# Patient Record
Sex: Male | Born: 1945 | Race: White | Hispanic: No | Marital: Single | Smoking: Former smoker
Health system: Southern US, Community
[De-identification: ages and names within clinical notes are randomized; demographics above are authoritative.]

## PROBLEM LIST (undated history)

## (undated) DIAGNOSIS — R809 Proteinuria, unspecified: Secondary | ICD-10-CM

## (undated) DIAGNOSIS — K5909 Other constipation: Secondary | ICD-10-CM

## (undated) DIAGNOSIS — J45909 Unspecified asthma, uncomplicated: Secondary | ICD-10-CM

## (undated) DIAGNOSIS — I509 Heart failure, unspecified: Secondary | ICD-10-CM

## (undated) DIAGNOSIS — F0390 Unspecified dementia without behavioral disturbance: Secondary | ICD-10-CM

## (undated) DIAGNOSIS — J449 Chronic obstructive pulmonary disease, unspecified: Secondary | ICD-10-CM

## (undated) DIAGNOSIS — E785 Hyperlipidemia, unspecified: Secondary | ICD-10-CM

## (undated) DIAGNOSIS — I639 Cerebral infarction, unspecified: Secondary | ICD-10-CM

## (undated) DIAGNOSIS — G709 Myoneural disorder, unspecified: Secondary | ICD-10-CM

## (undated) DIAGNOSIS — F319 Bipolar disorder, unspecified: Secondary | ICD-10-CM

## (undated) DIAGNOSIS — I10 Essential (primary) hypertension: Secondary | ICD-10-CM

## (undated) DIAGNOSIS — Z8719 Personal history of other diseases of the digestive system: Secondary | ICD-10-CM

## (undated) HISTORY — PX: CHOLECYSTECTOMY: SHX55

## (undated) HISTORY — PX: OTHER SURGICAL HISTORY: SHX169

## (undated) HISTORY — PX: NO PAST SURGERIES: SHX2092

---

## 2008-10-22 ENCOUNTER — Emergency Department (HOSPITAL_COMMUNITY): Admission: EM | Admit: 2008-10-22 | Discharge: 2008-10-22 | Payer: Self-pay | Admitting: Family Medicine

## 2009-05-15 ENCOUNTER — Encounter: Admission: RE | Admit: 2009-05-15 | Discharge: 2009-05-15 | Payer: Self-pay | Admitting: Orthopaedic Surgery

## 2010-11-16 ENCOUNTER — Other Ambulatory Visit: Payer: Self-pay | Admitting: Family Medicine

## 2010-11-29 ENCOUNTER — Ambulatory Visit
Admission: RE | Admit: 2010-11-29 | Discharge: 2010-11-29 | Disposition: A | Discharge status: Home / Self Care | Payer: Medicare Other | Source: Ambulatory Visit | Attending: Family Medicine | Admitting: Family Medicine

## 2014-12-18 ENCOUNTER — Other Ambulatory Visit: Payer: Self-pay

## 2014-12-18 ENCOUNTER — Emergency Department: Payer: Medicare Other

## 2014-12-18 ENCOUNTER — Encounter: Payer: Self-pay | Admitting: Emergency Medicine

## 2014-12-18 ENCOUNTER — Emergency Department
Admission: EM | Admit: 2014-12-18 | Discharge: 2014-12-18 | Disposition: A | Discharge status: Home / Self Care | Payer: Medicare Other | Attending: Emergency Medicine | Admitting: Emergency Medicine

## 2014-12-18 DIAGNOSIS — F319 Bipolar disorder, unspecified: Secondary | ICD-10-CM | POA: Insufficient documentation

## 2014-12-18 DIAGNOSIS — F0390 Unspecified dementia without behavioral disturbance: Secondary | ICD-10-CM | POA: Diagnosis not present

## 2014-12-18 DIAGNOSIS — Z720 Tobacco use: Secondary | ICD-10-CM | POA: Insufficient documentation

## 2014-12-18 DIAGNOSIS — R05 Cough: Secondary | ICD-10-CM | POA: Insufficient documentation

## 2014-12-18 DIAGNOSIS — R059 Cough, unspecified: Secondary | ICD-10-CM

## 2014-12-18 DIAGNOSIS — M5431 Sciatica, right side: Secondary | ICD-10-CM | POA: Insufficient documentation

## 2014-12-18 DIAGNOSIS — J42 Unspecified chronic bronchitis: Secondary | ICD-10-CM | POA: Diagnosis not present

## 2014-12-18 HISTORY — DX: Bipolar disorder, unspecified: F31.9

## 2014-12-18 HISTORY — DX: Unspecified dementia, unspecified severity, without behavioral disturbance, psychotic disturbance, mood disturbance, and anxiety: F03.90

## 2014-12-18 LAB — BASIC METABOLIC PANEL
Anion gap: 7 (ref 5–15)
BUN: 19 mg/dL (ref 6–20)
CHLORIDE: 101 mmol/L (ref 101–111)
CO2: 29 mmol/L (ref 22–32)
Calcium: 9.5 mg/dL (ref 8.9–10.3)
Creatinine, Ser: 1.1 mg/dL (ref 0.61–1.24)
GFR calc Af Amer: >60 mL/min (ref >60)
GLUCOSE: 107 mg/dL — AB (ref 65–99)
POTASSIUM: 4.6 mmol/L (ref 3.5–5.1)
Sodium: 137 mmol/L (ref 135–145)

## 2014-12-18 LAB — CBC
HCT: 46.4 % (ref 40.0–52.0)
Hemoglobin: 15.7 g/dL (ref 13.0–18.0)
MCH: 34 pg (ref 26.0–34.0)
MCHC: 33.8 g/dL (ref 32.0–36.0)
MCV: 100.6 fL — AB (ref 80.0–100.0)
PLATELETS: 242 10*3/uL (ref 150–440)
RBC: 4.61 MIL/uL (ref 4.40–5.90)
RDW: 13.7 % (ref 11.5–14.5)
WBC: 5.1 10*3/uL (ref 3.8–10.6)

## 2014-12-18 MED ORDER — HYDROCODONE-ACETAMINOPHEN 5-325 MG PO TABS: 1.0000 | ORAL_TABLET | Freq: Four times a day (QID) | ORAL | Status: DC | PRN

## 2014-12-18 MED ORDER — LEVOFLOXACIN 750 MG PO TABS: 750.0000 mg | ORAL_TABLET | Freq: Every day | ORAL | Status: DC

## 2014-12-18 MED ORDER — PREDNISONE 10 MG PO TABS: 10.0000 mg | ORAL_TABLET | Freq: Every day | ORAL | Status: DC

## 2014-12-18 NOTE — ED Notes (Signed)
Patient reports being sent here from MD office due to low oxygen, patient reports he has had a mild cough and since that time. Patient reports fall at group home and since then has had back and leg pain.

## 2014-12-18 NOTE — ED Provider Notes (Signed)
[facility] Emergency Department Provider Note     Time seen: ----------------------------------------- 3:07 PM on 12/18/2014 -----------------------------------------    I have reviewed the triage vital signs and the nursing notes.  Level 5 caveat: Review of systems and history is difficult to obtain due to dementia and bipolar disorder HISTORY  Chief Complaint Cough    HPI Cameron Ford is a 69 y.o. male who presents ER for cough and last month. According to report he is a chain smoker, also complains of radicular pain down the right leg. Cough is moderate, nothing makes it better or worse. He denies any fevers chills or other complaints.   Past Medical History  Diagnosis Date  . Dementia   . Bipolar 1 disorder     There are no active problems to display for this patient.   History reviewed. No pertinent past surgical history.  Allergies Review of patient's allergies indicates no known allergies.  Social History Social History  Substance Use Topics  . Smoking status: Current Every Day Smoker  . Smokeless tobacco: None  . Alcohol Use: None    Review of Systems Constitutional: Negative for fever. Eyes: Negative for visual changes. ENT: Negative for sore throat. Cardiovascular: Negative for chest pain. Respiratory: Negative for shortness of breath. Positive for cough Gastrointestinal: Negative for abdominal pain, vomiting and diarrhea. Genitourinary: Negative for dysuria. Musculoskeletal: Negative for back pain. Positive for right leg pain Skin: Negative for rash. Neurological: Negative for headaches, focal weakness or numbness.  10-point ROS otherwise negative.  ____________________________________________   PHYSICAL EXAM:  VITAL SIGNS: ED Triage Vitals  Enc Vitals Group     BP 12/18/14 1140 174/114 mmHg     Pulse Rate 12/18/14 1140 78     Resp 12/18/14 1140 20     Temp 12/18/14 1140 98.4 F (36.9 C)     Temp Source  12/18/14 1140 Oral     SpO2 12/18/14 1140 93 %     Weight 12/18/14 1140 192 lb (87.091 kg)     Height 12/18/14 1140  (1.651 m)     Head Cir --      Peak Flow --      Pain Score 12/18/14 1142 4     Pain Loc --      Pain Edu? --      Excl. in GC? --     Constitutional: Alert, Well appearing and in no distress. Eyes: Conjunctivae are normal. PERRL. Normal extraocular movements. ENT   Head: Normocephalic and atraumatic.   Nose: No congestion/rhinnorhea.   Mouth/Throat: Mucous membranes are moist.   Neck: No stridor. Severe kyphosis Cardiovascular: Normal rate, regular rhythm. Normal and symmetric distal pulses are present in all extremities. No murmurs, rubs, or gallops. Respiratory: Normal respiratory effort without tachypnea nor retractions. Breath sounds are clear and equal bilaterally. No wheezes/rales/rhonchi. Gastrointestinal: Soft and nontender. No distention. No abdominal bruits.  Musculoskeletal: Nontender with normal range of motion in all extremities. No joint effusions.  No lower extremity tenderness nor edema. Questionable positive straight leg raise exam on the right Neurologic:  No gross focal neurologic deficits are appreciated.  Skin:  Skin is warm, dry and intact. No rash noted. ____________________________________________  EKG: Interpreted by me. EKG with normal sinus rhythm with a rate of 64 bpm, normal PR interval, normal QS with, normal QT interval.  ____________________________________________  ED COURSE:  Pertinent labs & imaging results that were available during my care of the patient were reviewed by me and considered  in my medical decision making (see chart for details). Patient will need chest x-ray as well as basic labs. ____________________________________________    LABS (pertinent positives/negatives)  Labs Reviewed  CBC - Abnormal; Notable for the following:    MCV 100.6 (*)    All other components within normal limits  BASIC  METABOLIC PANEL - Abnormal; Notable for the following:    Glucose, Bld 107 (*)    All other components within normal limits    RADIOLOGY Images were viewed by me  Chest x-ray IMPRESSION: 1. No acute cardiopulmonary findings. Evidence of remote left-sided thoracic trauma. 2. No acute hip fracture or pelvic fracture. ____________________________________________  FINAL ASSESSMENT AND PLAN  Chronic bronchitis, right-sided sciatica  Plan: Patient with labs and imaging as dictated above. Patient will be discharged with steroid taper, antibiotics, and pain medication. He is stable for outpatient follow-up with his doctor.   Emily Filbert, MD   Emily Filbert, MD 12/18/14 [phone]

## 2014-12-18 NOTE — Discharge Instructions (Signed)
Radicular Pain Radicular pain in either the arm or leg is usually from a bulging or herniated disk in the spine. A piece of the herniated disk may press against the nerves as the nerves exit the spine. This causes pain which is felt at the tips of the nerves down the arm or leg. Other causes of radicular pain may include:  Fractures.  Heart disease.  Cancer.  An abnormal and usually degenerative state of the nervous system or nerves (neuropathy). Diagnosis may require CT or MRI scanning to determine the primary cause.  Nerves that start at the neck (nerve roots) may cause radicular pain in the outer shoulder and arm. It can spread down to the thumb and fingers. The symptoms vary depending on which nerve root has been affected. In most cases radicular pain improves with conservative treatment. Neck problems may require physical therapy, a neck collar, or cervical traction. Treatment may take many weeks, and surgery may be considered if the symptoms do not improve.  Conservative treatment is also recommended for sciatica. Sciatica causes pain to radiate from the lower back or buttock area down the leg into the foot. Often there is a history of back problems. Most patients with sciatica are better after 2 to 4 weeks of rest and other supportive care. Short term bed rest can reduce the disk pressure considerably. Sitting, however, is not a good position since this increases the pressure on the disk. You should avoid bending, lifting, and all other activities which make the problem worse. Traction can be used in severe cases. Surgery is usually reserved for patients who do not improve within the first months of treatment. Only take over-the-counter or prescription medicines for pain, discomfort, or fever as directed by your caregiver. Narcotics and muscle relaxants may help by relieving more severe pain and spasm and by providing mild sedation. Cold or massage can give significant relief. Spinal manipulation  is not recommended. It can increase the degree of disc protrusion. Epidural steroid injections are often effective treatment for radicular pain. These injections deliver medicine to the spinal nerve in the space between the protective covering of the spinal cord and back bones (vertebrae). Your caregiver can give you more information about steroid injections. These injections are most effective when given within two weeks of the onset of pain.  You should see your caregiver for follow up care as recommended. A program for neck and back injury rehabilitation with stretching and strengthening exercises is an important part of management.  SEEK IMMEDIATE MEDICAL CARE IF:  You develop increased pain, weakness, or numbness in your arm or leg.  You develop difficulty with bladder or bowel control.  You develop abdominal pain. Document Released: 05/26/2004 Document Revised: 07/11/2011 Document Reviewed: 08/11/2008 [facility] Patient Information 2015 [place], [place]. This information is not intended to replace advice given to you by your health care provider. Make sure you discuss any questions you have with your health care provider. Chronic Asthmatic Bronchitis Chronic asthmatic bronchitis is a complication of persistent asthma. After a period of time with asthma, some people develop airflow obstruction that is present all the time, even when not having an asthma attack.There is also persistent inflammation of the airways, and the bronchial tubes produce more mucus. Chronic asthmatic bronchitis usually is a permanent problem with the lungs. CAUSES  Chronic asthmatic bronchitis happens most often in people who have asthma and also smoke cigarettes. Occasionally, it can happen to a person with long-standing or severe asthma even if the person  is not a smoker. SIGNS AND SYMPTOMS  Chronic asthmatic bronchitis usually causes symptoms of both asthma and chronic bronchitis, including:   Coughing.  Increased  sputum production.  Wheezing and shortness of breath.  Chest discomfort.  Recurring infections. DIAGNOSIS  Your health care provider will take a medical history and perform a physical exam. Chronic asthmatic bronchitis is suspected when a person with asthma has abnormal results on breathing tests (pulmonary function tests) even when breathing symptoms are at their best. Other tests, such as a chest X-ray, may be performed to rule out other conditions.  TREATMENT  Treatment involves controlling symptoms with medicine and lifestyle changes.  Your health care provider may prescribe asthma medicines, including inhaler and nebulizer medicines.  Infection can be treated with medicine to kill germs (antibiotics). Serious infections may require hospitalization. These can include:  Pneumonia.  Sinus infections.  Acute bronchitis.   Preventing infection and hospitalization is very important. Get an influenza vaccination every year as directed by your health care provider. Ask your health care provider whether you need a pneumonia vaccine.  Ask your health care provider whether you would benefit from a pulmonary rehabilitation program. HOME CARE INSTRUCTIONS  Take medicines only as directed by your health care provider.  If you are a cigarette smoker, the most important thing that you can do is quit. Talk to your health care provider for help with quitting smoking.  Avoid pollen, dust, animal dander, molds, smoke, and other things that cause attacks.  Regular exercise is very important to help you feel better. Discuss possible exercise routines with your health care provider.  If animal dander is the cause of asthma, you may not be able to keep pets.  It is important that you:  Become educated about your medical condition.  Participate in maintaining wellness.  Seek medical care as directed. Delay in seeking medical care could cause permanent injury and may be a risk to your  life. SEEK MEDICAL CARE IF:  You have wheezing and shortness of breath even if taking medicine to prevent attacks.  You have muscle aches, chest pain, or thickening of sputum.  Your sputum changes from clear or white to yellow, green, gray, or bloody. SEEK IMMEDIATE MEDICAL CARE IF:  Your usual medicines do not stop your wheezing.  You have increased coughing or shortness of breath or both.  You have increased difficulty breathing.  You have any problems from the medicine you are taking, such as a rash, itching, swelling, or trouble breathing. MAKE SURE YOU:   Understand these instructions.  Will watch your condition.  Will get help right away if you are not doing well or get worse. Document Released: 02/03/2006 Document Revised: 09/02/2013 Document Reviewed: 05/27/2013 [facility] Patient Information 2015 [place], [place]. This information is not intended to replace advice given to you by your health care provider. Make sure you discuss any questions you have with your health care provider.

## 2014-12-25 ENCOUNTER — Emergency Department
Admission: EM | Admit: 2014-12-25 | Discharge: 2014-12-25 | Disposition: A | Discharge status: Home / Self Care | Payer: Medicare Other | Attending: Emergency Medicine | Admitting: Emergency Medicine

## 2014-12-25 ENCOUNTER — Encounter: Payer: Self-pay | Admitting: Emergency Medicine

## 2014-12-25 DIAGNOSIS — Z720 Tobacco use: Secondary | ICD-10-CM | POA: Insufficient documentation

## 2014-12-25 DIAGNOSIS — K5900 Constipation, unspecified: Secondary | ICD-10-CM | POA: Insufficient documentation

## 2014-12-25 DIAGNOSIS — Z79899 Other long term (current) drug therapy: Secondary | ICD-10-CM | POA: Diagnosis not present

## 2014-12-25 MED ORDER — POLYETHYLENE GLYCOL 3350 17 G PO PACK: 17.0000 g | PACK | Freq: Every day | ORAL | Status: DC | PRN

## 2014-12-25 MED ORDER — MAGNESIUM CITRATE PO SOLN: 1.0000 | Freq: Once | ORAL | Status: DC

## 2014-12-25 NOTE — ED Notes (Addendum)
Patient comes into the E via EMS c/o constipation.  Patient explains that he has not had a bowel movement in 6-7 days.  Patient went to urgent care this morning where they prescribed him metamucil.  Patient states he has been trying multiples laxatives and fiber supplements.  Denies abdominal pain, N/V, fever or chills.

## 2014-12-25 NOTE — Discharge Instructions (Signed)

## 2014-12-25 NOTE — ED Provider Notes (Signed)
[facility] Emergency Department Provider Note  ____________________________________________  Time seen: Seen upon arrival to the emergency department  I have reviewed the triage vital signs and the nursing notes.   HISTORY  Chief Complaint Constipation    HPI Cameron Ford is a 69 y.o. male with a history of bipolar disease and dementia who presents today with constipation. He says that he has not moved his bowels in 7 days. He was seen in urgent care this morning where an x-ray was done and constipation was diagnosed. He was then prescribed Metamucil as well as senna. However, he says that the Metamucil is making him nauseous.He says that he usually moves his bowels only once every 2-3 days. He says that he usually takes a laxative when he has difficulty moving his bowels. Denies any pain. Denies any vomiting. Is passing gas and said past a small amount of hard stool this morning.   Past Medical History  Diagnosis Date  . Dementia   . Bipolar 1 disorder     There are no active problems to display for this patient.   History reviewed. No pertinent past surgical history.  Current Outpatient Rx  Name  Route  Sig  Dispense  Refill  . HYDROcodone-acetaminophen (NORCO/VICODIN) 5-325 MG per tablet   Oral   Take 1 tablet by mouth every 6 (six) hours as needed for moderate pain.   30 tablet   0   . levofloxacin (LEVAQUIN) 750 MG tablet   Oral   Take 1 tablet (750 mg total) by mouth daily.   5 tablet   0   . predniSONE (DELTASONE) 10 MG tablet   Oral   Take 1 tablet (10 mg total) by mouth daily. Take 60 mg on day one, then decrease by 10 mg daily until gone   21 tablet   0     Allergies Review of patient's allergies indicates no known allergies.  No family history on file.  Social History Social History  Substance Use Topics  . Smoking status: Current Every Day Smoker -- 1.00 packs/day  . Smokeless tobacco: None  . Alcohol Use: No     Review of Systems Constitutional: No fever/chills Eyes: No visual changes. ENT: No sore throat. Cardiovascular: Denies chest pain. Respiratory: Denies shortness of breath. Gastrointestinal: No abdominal pain.  No nausea, no vomiting.  No diarrhea.   Genitourinary: Negative for dysuria. Musculoskeletal: Negative for back pain. Skin: Negative for rash. Neurological: Negative for headaches, focal weakness or numbness.  10-point ROS otherwise negative.  ____________________________________________   PHYSICAL EXAM:  VITAL SIGNS: ED Triage Vitals  Enc Vitals Group     BP 12/25/14 1720 107/59 mmHg     Pulse Rate 12/25/14 1720 75     Resp 12/25/14 1720 20     Temp 12/25/14 1720 98.6 F (37 C)     Temp Source 12/25/14 1720 Oral     SpO2 12/25/14 1720 93 %     Weight 12/25/14 1720 200 lb 6.4 oz (90.901 kg)     Height 12/25/14 1720  (1.778 m)     Head Cir --      Peak Flow --      Pain Score --      Pain Loc --      Pain Edu? --      Excl. in GC? --     Constitutional: Alert and oriented. Well appearing and in no acute distress. Eyes: Conjunctivae are normal. PERRL. EOMI. Head:  Atraumatic. Nose: No congestion/rhinnorhea. Mouth/Throat: Mucous membranes are moist.  Oropharynx non-erythematous. Neck: No stridor.   Cardiovascular: Normal rate, regular rhythm. Grossly normal heart sounds.  Good peripheral circulation. Respiratory: Normal respiratory effort.  No retractions. Lungs CTAB. Gastrointestinal: Soft and nontender. No distention. No abdominal bruits. No CVA tenderness. Rectal exam without any fecal impaction. Musculoskeletal: No lower extremity tenderness nor edema.  No joint effusions. Neurologic:  Normal speech and language. No gross focal neurologic deficits are appreciated. No gait instability. Skin:  Skin is warm, dry and intact. No rash noted. Psychiatric: Mood and affect are normal. Speech and behavior are  normal.  ____________________________________________   LABS (all labs ordered are listed, but only abnormal results are displayed)  Labs Reviewed - No data to display ____________________________________________  EKG   ____________________________________________  RADIOLOGY   ____________________________________________   PROCEDURES    ____________________________________________   INITIAL IMPRESSION / ASSESSMENT AND PLAN / ED COURSE  Pertinent labs & imaging results that were available during my care of the patient were reviewed by me and considered in my medical decision making (see chart for details).  ----------------------------------------- 5:34 PM on 12/25/2014 -----------------------------------------  Patient resting comfortably at this time. We'll prescribe MiraLAX that the patient may take instead of the Metamucil. I will also prescribe him mag citrate if he does not have any bowel movement within 24 hours after taking the MiraLAX. We'll discharge to home. ____________________________________________   FINAL CLINICAL IMPRESSION(S) / ED DIAGNOSES  Acute on chronic constipation. Initial visit.     Myrna Blazer, MD 12/25/14 [phone]

## 2015-02-06 ENCOUNTER — Encounter: Payer: Self-pay | Admitting: *Deleted

## 2015-02-09 ENCOUNTER — Encounter
Admission: RE | Disposition: A | Discharge status: Home / Self Care | Payer: Self-pay | Source: Ambulatory Visit | Attending: Gastroenterology

## 2015-02-09 ENCOUNTER — Ambulatory Visit: Payer: Medicare Other | Admitting: Anesthesiology

## 2015-02-09 ENCOUNTER — Ambulatory Visit
Admission: RE | Admit: 2015-02-09 | Discharge: 2015-02-09 | Disposition: A | Discharge status: Home / Self Care | Payer: Medicare Other | Source: Ambulatory Visit | Attending: Gastroenterology | Admitting: Gastroenterology

## 2015-02-09 ENCOUNTER — Encounter: Payer: Self-pay | Admitting: *Deleted

## 2015-02-09 DIAGNOSIS — R809 Proteinuria, unspecified: Secondary | ICD-10-CM | POA: Insufficient documentation

## 2015-02-09 DIAGNOSIS — Z9049 Acquired absence of other specified parts of digestive tract: Secondary | ICD-10-CM | POA: Insufficient documentation

## 2015-02-09 DIAGNOSIS — R251 Tremor, unspecified: Secondary | ICD-10-CM | POA: Diagnosis not present

## 2015-02-09 DIAGNOSIS — Z79899 Other long term (current) drug therapy: Secondary | ICD-10-CM | POA: Insufficient documentation

## 2015-02-09 DIAGNOSIS — E785 Hyperlipidemia, unspecified: Secondary | ICD-10-CM | POA: Diagnosis not present

## 2015-02-09 DIAGNOSIS — F17200 Nicotine dependence, unspecified, uncomplicated: Secondary | ICD-10-CM | POA: Diagnosis not present

## 2015-02-09 DIAGNOSIS — F0390 Unspecified dementia without behavioral disturbance: Secondary | ICD-10-CM | POA: Insufficient documentation

## 2015-02-09 DIAGNOSIS — K644 Residual hemorrhoidal skin tags: Secondary | ICD-10-CM | POA: Insufficient documentation

## 2015-02-09 DIAGNOSIS — F319 Bipolar disorder, unspecified: Secondary | ICD-10-CM | POA: Insufficient documentation

## 2015-02-09 DIAGNOSIS — K625 Hemorrhage of anus and rectum: Secondary | ICD-10-CM | POA: Diagnosis present

## 2015-02-09 DIAGNOSIS — K640 First degree hemorrhoids: Secondary | ICD-10-CM | POA: Insufficient documentation

## 2015-02-09 DIAGNOSIS — K5909 Other constipation: Secondary | ICD-10-CM | POA: Insufficient documentation

## 2015-02-09 DIAGNOSIS — Z7982 Long term (current) use of aspirin: Secondary | ICD-10-CM | POA: Diagnosis not present

## 2015-02-09 DIAGNOSIS — I10 Essential (primary) hypertension: Secondary | ICD-10-CM | POA: Insufficient documentation

## 2015-02-09 HISTORY — PX: COLONOSCOPY WITH PROPOFOL: SHX5780

## 2015-02-09 HISTORY — DX: Myoneural disorder, unspecified: G70.9

## 2015-02-09 HISTORY — DX: Essential (primary) hypertension: I10

## 2015-02-09 HISTORY — PX: ESOPHAGOGASTRODUODENOSCOPY (EGD) WITH PROPOFOL: SHX5813

## 2015-02-09 HISTORY — DX: Hyperlipidemia, unspecified: E78.5

## 2015-02-09 HISTORY — DX: Personal history of other diseases of the digestive system: Z87.19

## 2015-02-09 HISTORY — DX: Other constipation: K59.09

## 2015-02-09 HISTORY — DX: Proteinuria, unspecified: R80.9

## 2015-02-09 SURGERY — COLONOSCOPY WITH PROPOFOL
Anesthesia: General

## 2015-02-09 MED ORDER — FENTANYL CITRATE (PF) 100 MCG/2ML IJ SOLN
INTRAMUSCULAR | Status: DC | PRN
  Administered 2015-02-09: 50 ug via INTRAVENOUS

## 2015-02-09 MED ORDER — SODIUM CHLORIDE 0.9 % IV SOLN
INTRAVENOUS | Status: DC
  Administered 2015-02-09: 1000 mL via INTRAVENOUS

## 2015-02-09 MED ORDER — PHENYLEPHRINE HCL 10 MG/ML IJ SOLN
INTRAMUSCULAR | Status: DC | PRN
  Administered 2015-02-09: 50 ug via INTRAVENOUS

## 2015-02-09 MED ORDER — LIDOCAINE HCL (CARDIAC) 20 MG/ML IV SOLN
INTRAVENOUS | Status: DC | PRN
  Administered 2015-02-09: 50 mg via INTRAVENOUS

## 2015-02-09 MED ORDER — PROPOFOL 10 MG/ML IV BOLUS
INTRAVENOUS | Status: DC | PRN
  Administered 2015-02-09 (×2): 20 mg via INTRAVENOUS

## 2015-02-09 MED ORDER — PROPOFOL 500 MG/50ML IV EMUL
INTRAVENOUS | Status: DC | PRN
  Administered 2015-02-09: 100 ug/kg/min via INTRAVENOUS

## 2015-02-09 NOTE — Discharge Instructions (Signed)

## 2015-02-09 NOTE — Anesthesia Postprocedure Evaluation (Signed)
  Anesthesia Post-op Note  Patient: Cameron Ford  Procedure(s) Performed: Procedure(s): COLONOSCOPY WITH PROPOFOL (N/A) ESOPHAGOGASTRODUODENOSCOPY (EGD) WITH PROPOFOL (N/A)  Anesthesia type:General  Patient location: PACU  Post pain: Pain level controlled  Post assessment: Post-op Vital signs reviewed, Patient's Cardiovascular Status Stable, Respiratory Function Stable, Patent Airway and No signs of Nausea or vomiting  Post vital signs: Reviewed and stable  Last Vitals:  Filed Vitals:   02/09/15 1010  BP: 119/66  Pulse: 59  Temp:   Resp: 17    Level of consciousness: awake, alert  and patient cooperative  Complications: No apparent anesthesia complications

## 2015-02-09 NOTE — Op Note (Signed)
[facility] Gastroenterology Patient Name: Cameron Ford Procedure Date: 02/09/2015 8:54 AM MRN: [id number] Account #: [id number] Date of Birth: November 30, 1945 Admit Type: Outpatient Age: 69 Room: [facility] ENDO ROOM 3 Gender: Male Note Status: Finalized Procedure:         Colonoscopy Indications:       Heme positive stool, Rectal bleeding Patient Profile:   This is a 69 year old male. Providers:         Rhona Raider. Shelle Iron, MD Referring MD:      Windle Guard, MD (Referring MD) Medicines:         Propofol per Anesthesia Complications:     No immediate complications. Procedure:         Pre-Anesthesia Assessment:                    - Prior to the procedure, a History and Physical was                     performed, and patient medications, allergies and                     sensitivities were reviewed. The patient's tolerance of                     previous anesthesia was reviewed.                    After obtaining informed consent, the colonoscope was                     passed under direct vision. Throughout the procedure, the                     patient's blood pressure, pulse, and oxygen saturations                     were monitored continuously. The Colonoscope was                     introduced through the anus and advanced to the the cecum,                     identified by appendiceal orifice and ileocecal valve. The                     colonoscopy was unusually difficult due to significant                     looping and a tortuous colon. Successful completion of the                     procedure was aided by using manual pressure. The patient                     tolerated the procedure well. The quality of the bowel                     preparation was good. Findings:      The perianal exam findings include non-thrombosed external hemorrhoids.      Internal hemorrhoids were found during retroflexion. The hemorrhoids       were Grade I (internal hemorrhoids that do  not prolapse).      The exam was otherwise without abnormality. Impression:        - Non-thrombosed external  hemorrhoids found on perianal                     exam.                    - Internal hemorrhoids.                    - The examination was otherwise normal.                    - No specimens collected. Recommendation:    - Observe patient in GI recovery unit.                    - High fiber diet.                    - Continue present medications.                    - Return to referring physician.                    - No further screening colonoscopies given age at next                     date would be 51 and multiple comorbidities.                    - The findings and recommendations were discussed with the                     patient. Procedure Code(s): --- Professional ---                    [phone], Colonoscopy, flexible; diagnostic, including                     collection of specimen(s) by brushing or washing, when                     performed (separate procedure) CPT copyright 2014 [employer]. All rights reserved. The codes documented in this report are preliminary and upon coder review may  be revised to meet current compliance requirements. Cameron Frames, MD 02/09/2015 9:41:00 AM This report has been signed electronically. Number of Addenda: 0 Note Initiated On: 02/09/2015 8:54 AM Scope Withdrawal Time: 0 hours 17 minutes 36 seconds  Total Procedure Duration: 0 hours 36 minutes 10 seconds       [facility]

## 2015-02-09 NOTE — Transfer of Care (Signed)
Immediate Anesthesia Transfer of Care Note  Patient: Cameron Ford  Procedure(s) Performed: Procedure(s): COLONOSCOPY WITH PROPOFOL (N/A) ESOPHAGOGASTRODUODENOSCOPY (EGD) WITH PROPOFOL (N/A)  Patient Location: PACU  Anesthesia Type:General  Level of Consciousness: awake, alert  and oriented  Airway & Oxygen Therapy: Patient Spontanous Breathing and Patient connected to nasal cannula oxygen  Post-op Assessment: Report given to RN and Post -op Vital signs reviewed and stable  Post vital signs: Reviewed and stable  Last Vitals:  Filed Vitals:   02/09/15 0825  BP: 133/65  Pulse: 68  Temp: 36.8 C  Resp: 18    Complications: No apparent anesthesia complications
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
## 2015-02-09 NOTE — Anesthesia Preprocedure Evaluation (Signed)
Anesthesia Evaluation  Patient identified by MRN, date of birth, ID band Patient awake    Reviewed: Allergy & Precautions, H&P , NPO status , Patient's Chart, lab work & pertinent test results  Airway Mallampati: III  TM Distance: >3 FB Neck ROM: full    Dental  (+) Poor Dentition   Pulmonary Current Smoker,    Pulmonary exam normal breath sounds clear to auscultation       Cardiovascular Exercise Tolerance: Poor hypertension, (-) Past MI Normal cardiovascular exam Rhythm:regular Rate:Normal     Neuro/Psych PSYCHIATRIC DISORDERS Bipolar Disorder  Neuromuscular disease negative psych ROS   GI/Hepatic negative GI ROS, Neg liver ROS, neg GERD  ,  Endo/Other  negative endocrine ROS  Renal/GU negative Renal ROS  negative genitourinary   Musculoskeletal   Abdominal   Peds  Hematology negative hematology ROS (+)   Anesthesia Other Findings Past Medical History:   Dementia                                                     Bipolar 1 disorder (HCC)                                     Hyperlipidemia                                               Microalbuminuria                                             Neuromuscular disorder (HCC)                                   Comment:tremors   Chronic constipation                                         History of small bowel obstruction                           Hypertension                                                Signs and symptoms suggestive of sleep apnea   BMI    Body Mass Index   33.58 kg/m 2      Reproductive/Obstetrics negative OB ROS                             Anesthesia Physical Anesthesia Plan  ASA: III  Anesthesia Plan: General   Post-op Pain Management:    Induction:   Airway Management Planned:   Additional Equipment:   Intra-op Plan:   Post-operative Plan:   Informed  Consent: I have reviewed the patients  History and Physical, chart, labs and discussed the procedure including the risks, benefits and alternatives for the proposed anesthesia with the patient or authorized representative who has indicated his/her understanding and acceptance.   Dental Advisory Given  Plan Discussed with: Anesthesiologist, CRNA and Surgeon  Anesthesia Plan Comments:         Anesthesia Quick Evaluation

## 2015-02-18 ENCOUNTER — Encounter: Payer: Self-pay | Admitting: Gastroenterology

## 2015-03-12 ENCOUNTER — Observation Stay
Admission: EM | Admit: 2015-03-12 | Discharge: 2015-03-14 | Payer: Medicare Other | Attending: Internal Medicine | Admitting: Internal Medicine

## 2015-03-12 ENCOUNTER — Encounter: Payer: Self-pay | Admitting: Emergency Medicine

## 2015-03-12 DIAGNOSIS — R251 Tremor, unspecified: Secondary | ICD-10-CM | POA: Insufficient documentation

## 2015-03-12 DIAGNOSIS — R109 Unspecified abdominal pain: Secondary | ICD-10-CM | POA: Insufficient documentation

## 2015-03-12 DIAGNOSIS — K5900 Constipation, unspecified: Secondary | ICD-10-CM | POA: Insufficient documentation

## 2015-03-12 DIAGNOSIS — I10 Essential (primary) hypertension: Secondary | ICD-10-CM | POA: Diagnosis not present

## 2015-03-12 DIAGNOSIS — R809 Proteinuria, unspecified: Secondary | ICD-10-CM | POA: Diagnosis not present

## 2015-03-12 DIAGNOSIS — F319 Bipolar disorder, unspecified: Secondary | ICD-10-CM | POA: Insufficient documentation

## 2015-03-12 DIAGNOSIS — R05 Cough: Secondary | ICD-10-CM | POA: Insufficient documentation

## 2015-03-12 DIAGNOSIS — Z7982 Long term (current) use of aspirin: Secondary | ICD-10-CM | POA: Diagnosis not present

## 2015-03-12 DIAGNOSIS — K644 Residual hemorrhoidal skin tags: Secondary | ICD-10-CM | POA: Insufficient documentation

## 2015-03-12 DIAGNOSIS — K922 Gastrointestinal hemorrhage, unspecified: Secondary | ICD-10-CM | POA: Diagnosis not present

## 2015-03-12 DIAGNOSIS — Z79899 Other long term (current) drug therapy: Secondary | ICD-10-CM | POA: Diagnosis not present

## 2015-03-12 DIAGNOSIS — K649 Unspecified hemorrhoids: Secondary | ICD-10-CM | POA: Diagnosis not present

## 2015-03-12 DIAGNOSIS — R197 Diarrhea, unspecified: Secondary | ICD-10-CM | POA: Diagnosis not present

## 2015-03-12 DIAGNOSIS — E785 Hyperlipidemia, unspecified: Secondary | ICD-10-CM | POA: Insufficient documentation

## 2015-03-12 DIAGNOSIS — F17200 Nicotine dependence, unspecified, uncomplicated: Secondary | ICD-10-CM | POA: Diagnosis not present

## 2015-03-12 DIAGNOSIS — F0390 Unspecified dementia without behavioral disturbance: Secondary | ICD-10-CM | POA: Diagnosis not present

## 2015-03-12 DIAGNOSIS — Z8673 Personal history of transient ischemic attack (TIA), and cerebral infarction without residual deficits: Secondary | ICD-10-CM | POA: Insufficient documentation

## 2015-03-12 HISTORY — DX: Cerebral infarction, unspecified: I63.9

## 2015-03-12 LAB — URINALYSIS COMPLETE WITH MICROSCOPIC (ARMC ONLY)
BILIRUBIN URINE: NEGATIVE
Glucose, UA: NEGATIVE mg/dL
HGB URINE DIPSTICK: NEGATIVE
Ketones, ur: NEGATIVE mg/dL
LEUKOCYTES UA: NEGATIVE
Nitrite: NEGATIVE
PH: 5 (ref 5.0–8.0)
PROTEIN: NEGATIVE mg/dL
RBC / HPF: NONE SEEN RBC/hpf (ref 0–5)
SQUAMOUS EPITHELIAL / LPF: NONE SEEN
Specific Gravity, Urine: 1.006 (ref 1.005–1.030)
WBC UA: NONE SEEN WBC/hpf (ref 0–5)

## 2015-03-12 LAB — CBC WITH DIFFERENTIAL/PLATELET
BASOS ABS: 0.1 10*3/uL (ref 0–0.1)
BASOS PCT: 1 %
EOS PCT: 2 %
Eosinophils Absolute: 0.2 10*3/uL (ref 0–0.7)
HCT: 47.9 % (ref 40.0–52.0)
Hemoglobin: 15.9 g/dL (ref 13.0–18.0)
LYMPHS PCT: 18 %
Lymphs Abs: 1.5 10*3/uL (ref 1.0–3.6)
MCH: 34.1 pg — ABNORMAL HIGH (ref 26.0–34.0)
MCHC: 33.1 g/dL (ref 32.0–36.0)
MCV: 103 fL — AB (ref 80.0–100.0)
Monocytes Absolute: 0.9 10*3/uL (ref 0.2–1.0)
Monocytes Relative: 10 %
Neutro Abs: 6 10*3/uL (ref 1.4–6.5)
Neutrophils Relative %: 69 %
PLATELETS: 270 10*3/uL (ref 150–440)
RBC: 4.65 MIL/uL (ref 4.40–5.90)
RDW: 14.2 % (ref 11.5–14.5)
WBC: 8.7 10*3/uL (ref 3.8–10.6)

## 2015-03-12 LAB — COMPREHENSIVE METABOLIC PANEL
ALBUMIN: 3.5 g/dL (ref 3.5–5.0)
ALT: 19 U/L (ref 17–63)
AST: 29 U/L (ref 15–41)
Alkaline Phosphatase: 80 U/L (ref 38–126)
Anion gap: 4 — ABNORMAL LOW (ref 5–15)
BUN: 21 mg/dL — AB (ref 6–20)
CHLORIDE: 103 mmol/L (ref 101–111)
CO2: 29 mmol/L (ref 22–32)
CREATININE: 1.07 mg/dL (ref 0.61–1.24)
Calcium: 8.9 mg/dL (ref 8.9–10.3)
GFR calc Af Amer: >60 mL/min (ref >60)
GLUCOSE: 108 mg/dL — AB (ref 65–99)
POTASSIUM: 5 mmol/L (ref 3.5–5.1)
SODIUM: 136 mmol/L (ref 135–145)
Total Bilirubin: 1 mg/dL (ref 0.3–1.2)
Total Protein: 6.6 g/dL (ref 6.5–8.1)

## 2015-03-12 LAB — ABO/RH: ABO/RH(D): O POS

## 2015-03-12 LAB — TYPE AND SCREEN
ABO/RH(D): O POS
ANTIBODY SCREEN: NEGATIVE

## 2015-03-12 LAB — HEMOGLOBIN: HEMOGLOBIN: 14.5 g/dL (ref 13.0–18.0)

## 2015-03-12 LAB — LITHIUM LEVEL: LITHIUM LVL: 0.7 mmol/L (ref 0.60–1.20)

## 2015-03-12 LAB — TROPONIN I: Troponin I: <0.03 ng/mL (ref <0.031)

## 2015-03-12 MED ORDER — LISINOPRIL 10 MG PO TABS
10.0000 mg | ORAL_TABLET | Freq: Every day | ORAL | Status: DC
  Administered 2015-03-13 – 2015-03-14 (×2): 10 mg via ORAL
  Filled 2015-03-12 (×2): qty 1

## 2015-03-12 MED ORDER — SODIUM CHLORIDE 0.9 % IV SOLN
INTRAVENOUS | Status: DC
  Administered 2015-03-12: 22:00:00 via INTRAVENOUS

## 2015-03-12 MED ORDER — RISPERIDONE 0.5 MG PO TABS
1.0000 mg | ORAL_TABLET | Freq: Three times a day (TID) | ORAL | Status: DC
  Administered 2015-03-12 – 2015-03-14 (×5): 1 mg via ORAL
  Filled 2015-03-12 (×5): qty 2

## 2015-03-12 MED ORDER — PROPRANOLOL HCL 40 MG PO TABS
40.0000 mg | ORAL_TABLET | Freq: Two times a day (BID) | ORAL | Status: DC
  Administered 2015-03-12 – 2015-03-14 (×4): 40 mg via ORAL
  Filled 2015-03-12 (×4): qty 1

## 2015-03-12 MED ORDER — BENZTROPINE MESYLATE 0.5 MG PO TABS
0.5000 mg | ORAL_TABLET | Freq: Three times a day (TID) | ORAL | Status: DC
  Administered 2015-03-12 – 2015-03-14 (×5): 0.5 mg via ORAL
  Filled 2015-03-12 (×9): qty 1

## 2015-03-12 MED ORDER — ONDANSETRON HCL 4 MG/2ML IJ SOLN: 4.0000 mg | Freq: Four times a day (QID) | INTRAMUSCULAR | Status: DC | PRN

## 2015-03-12 MED ORDER — LITHIUM CARBONATE 300 MG PO CAPS
300.0000 mg | ORAL_CAPSULE | Freq: Every day | ORAL | Status: DC
  Administered 2015-03-12 – 2015-03-13 (×2): 300 mg via ORAL
  Filled 2015-03-12 (×4): qty 1

## 2015-03-12 MED ORDER — ONDANSETRON HCL 4 MG PO TABS: 4.0000 mg | ORAL_TABLET | Freq: Four times a day (QID) | ORAL | Status: DC | PRN

## 2015-03-12 MED ORDER — ACETAMINOPHEN 325 MG PO TABS
650.0000 mg | ORAL_TABLET | Freq: Four times a day (QID) | ORAL | Status: DC | PRN
  Administered 2015-03-13: 650 mg via ORAL
  Filled 2015-03-12: qty 2

## 2015-03-12 MED ORDER — SODIUM CHLORIDE 0.9 % IV BOLUS (SEPSIS)
1000.0000 mL | Freq: Once | INTRAVENOUS | Status: AC
  Administered 2015-03-12: 1000 mL via INTRAVENOUS

## 2015-03-12 MED ORDER — IPRATROPIUM-ALBUTEROL 0.5-2.5 (3) MG/3ML IN SOLN: 3.0000 mL | RESPIRATORY_TRACT | Status: DC | PRN

## 2015-03-12 MED ORDER — CARBAMAZEPINE 200 MG PO TABS
200.0000 mg | ORAL_TABLET | Freq: Two times a day (BID) | ORAL | Status: DC
  Administered 2015-03-12 – 2015-03-14 (×4): 200 mg via ORAL
  Filled 2015-03-12 (×4): qty 1

## 2015-03-12 MED ORDER — LUBIPROSTONE 8 MCG PO CAPS
8.0000 ug | ORAL_CAPSULE | Freq: Two times a day (BID) | ORAL | Status: DC
  Administered 2015-03-13 – 2015-03-14 (×3): 8 ug via ORAL
  Filled 2015-03-12 (×6): qty 1

## 2015-03-12 MED ORDER — ATORVASTATIN CALCIUM 20 MG PO TABS
20.0000 mg | ORAL_TABLET | Freq: Every day | ORAL | Status: DC
  Administered 2015-03-12 – 2015-03-13 (×2): 20 mg via ORAL
  Filled 2015-03-12 (×2): qty 1

## 2015-03-12 MED ORDER — ACETAMINOPHEN 650 MG RE SUPP: 650.0000 mg | Freq: Four times a day (QID) | RECTAL | Status: DC | PRN
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
## 2015-03-12 NOTE — ED Notes (Signed)
Stool guaiac positive, noted frank blood, Dr. Derrill KayGoodman aware.

## 2015-03-12 NOTE — ED Notes (Signed)
[facility] of [facility] pain followed by incontinent diarrhea.  Pt sat in chair and had possible vagal episode.

## 2015-03-13 DIAGNOSIS — K922 Gastrointestinal hemorrhage, unspecified: Secondary | ICD-10-CM | POA: Diagnosis not present

## 2015-03-13 LAB — CBC
HCT: 41 % (ref 40.0–52.0)
HEMOGLOBIN: 13.5 g/dL (ref 13.0–18.0)
MCH: 33.9 pg (ref 26.0–34.0)
MCHC: 32.9 g/dL (ref 32.0–36.0)
MCV: 103.1 fL — AB (ref 80.0–100.0)
Platelets: 244 10*3/uL (ref 150–440)
RBC: 3.98 MIL/uL — ABNORMAL LOW (ref 4.40–5.90)
RDW: 13.9 % (ref 11.5–14.5)
WBC: 7.8 10*3/uL (ref 3.8–10.6)

## 2015-03-13 LAB — BASIC METABOLIC PANEL
Anion gap: 3 — ABNORMAL LOW (ref 5–15)
BUN: 17 mg/dL (ref 6–20)
CO2: 30 mmol/L (ref 22–32)
Calcium: 8.7 mg/dL — ABNORMAL LOW (ref 8.9–10.3)
Chloride: 104 mmol/L (ref 101–111)
Creatinine, Ser: 0.87 mg/dL (ref 0.61–1.24)
GFR calc Af Amer: >60 mL/min (ref >60)
GFR calc non Af Amer: >60 mL/min (ref >60)
Glucose, Bld: 104 mg/dL — ABNORMAL HIGH (ref 65–99)
Potassium: 4.3 mmol/L (ref 3.5–5.1)
Sodium: 137 mmol/L (ref 135–145)

## 2015-03-13 LAB — HEMOGLOBIN: Hemoglobin: 13.6 g/dL (ref 13.0–18.0)

## 2015-03-13 MED ORDER — FUROSEMIDE 10 MG/ML IJ SOLN
INTRAMUSCULAR | Status: AC
  Administered 2015-03-13: 20 mg via INTRAVENOUS
  Filled 2015-03-13: qty 2

## 2015-03-13 MED ORDER — FUROSEMIDE 10 MG/ML IJ SOLN
20.0000 mg | Freq: Once | INTRAMUSCULAR | Status: AC
  Administered 2015-03-13: 20 mg via INTRAVENOUS

## 2015-03-13 NOTE — Clinical Social Work Note (Signed)
Clinical Social Worker left a message for pt's guardian, [name] [phone]),[facility] requesting a return phone call. CSW will continue to follow.   Dede QuerySarah Topaz Raglin, MSW, LCSW Clinical Social Worker  [phone]

## 2015-03-13 NOTE — Progress Notes (Signed)
[facility] - [place] at [facility]   PATIENT NAME: Cameron Ford    MR#:  [id number]  DATE OF BIRTH:  05-28-1945  SUBJECTIVE:  CHIEF COMPLAINT:  Patient is resting comfortably. Denies any bowel movements today as reported by the nurse patient had one bowel movement yesterday night with bloody streaks.  REVIEW OF SYSTEMS:  CONSTITUTIONAL: No fever, fatigue or weakness.  EYES: No blurred or double vision.  EARS, NOSE, AND THROAT: No tinnitus or ear pain.  RESPIRATORY: No cough, shortness of breath, wheezing or hemoptysis.  CARDIOVASCULAR: No chest pain, orthopnea, edema.  GASTROINTESTINAL: No nausea, vomiting, diarrhea or abdominal pain.  GENITOURINARY: No dysuria, hematuria.  ENDOCRINE: No polyuria, nocturia,  HEMATOLOGY: No anemia, easy bruising or bleeding SKIN: No rash or lesion. MUSCULOSKELETAL: No joint pain or arthritis.   NEUROLOGIC: No tingling, numbness, weakness.  PSYCHIATRY: No anxiety or depression.   DRUG ALLERGIES:  No Known Allergies  VITALS:  Blood pressure 109/53, pulse 62, temperature 98.8 F (37.1 C), temperature source Oral, resp. rate 20, height 5\' 8"  (1.727 m), weight 80.105 kg (176 lb 9.6 oz), SpO2 93 %.  PHYSICAL EXAMINATION:  GENERAL:  69 y.o.-year-old patient lying in the bed with no acute distress.  EYES: Pupils equal, round, reactive to light and accommodation. No scleral icterus. Extraocular muscles intact.  HEENT: Head atraumatic, normocephalic. Oropharynx and nasopharynx clear.  NECK:  Supple, no jugular venous distention. No thyroid enlargement, no tenderness.  LUNGS: Normal breath sounds bilaterally, no wheezing, rales,rhonchi or crepitation. No use of accessory muscles of respiration.  CARDIOVASCULAR: S1, S2 normal. No murmurs, rubs, or gallops.  ABDOMEN: Soft, nontender, nondistended. Bowel sounds present. No organomegaly or mass.  EXTREMITIES: No pedal edema, cyanosis, or clubbing.  NEUROLOGIC: Cranial nerves II  through XII are intact. Muscle strength 5/5 in all extremities. Sensation intact. Gait not checked.  PSYCHIATRIC: The patient is alert and oriented x 3.  SKIN: No obvious rash, lesion, or ulcer.    LABORATORY PANEL:   CBC  Recent Labs Lab 03/13/15 0320 03/13/15 0904  WBC 7.8  --   HGB 13.5 13.6  HCT 41.0  --   PLT 244  --    ------------------------------------------------------------------------------------------------------------------  Chemistries   Recent Labs Lab 03/12/15 1559 03/13/15 0320  NA 136 137  K 5.0 4.3  CL 103 104  CO2 29 30  GLUCOSE 108* 104*  BUN 21* 17  CREATININE 1.07 0.87  CALCIUM 8.9 8.7*  AST 29  --   ALT 19  --   ALKPHOS 80  --   BILITOT 1.0  --    ------------------------------------------------------------------------------------------------------------------  Cardiac Enzymes  Recent Labs Lab 03/12/15 1559  TROPONINI <0.03   ------------------------------------------------------------------------------------------------------------------  RADIOLOGY:  No results found.  EKG:   Orders placed or performed during the hospital encounter of 12/18/14  . ED EKG  . ED EKG  . EKG    ASSESSMENT AND PLAN:   69 year old male with past history of hypertension, bipolar disorder, dementia, hyperlipidemia, history of previous CVA who presented to the hospital due to multiple episodes of rectal bleeding.  #1 GI bleed-this is likely a lower GI bleed given his rectal bleeding. probably hemorrhoidal bleeding   recent colonoscopy  was noted to have internal & external Hemorrhoids. -Hemoglobin is stable and I'll observe him overnight.  -Advance diet as tolerated  -Repeat CBC  in the morning. -If he continues to have further bleeding would consider gastroenterology consult, otherwise Will discharge patient  #2 history of bipolar disorder-continue  Tegretol, lithium. I will check a lithium level.  #3 history of dementia-continue Risperdal,  Cogentin.  #4 hyperlipidemia-continue atorvastatin.  #5 hypertension-continue lisinopril.     All the records are reviewed and case discussed with Care Management/Social Workerr. Management plans discussed with the patient, family and they are in agreement.  CODE STATUS: fc  TOTAL TIME TAKING CARE OF THIS PATIENT: 35  minutes.   POSSIBLE D/C IN 1-2  DAYS, DEPENDING ON CLINICAL CONDITION.   Ramonita Lab M.D on 03/13/2015 at 5:06 PM  Between 7am to 6pm - Pager - [phone] After 6pm go to [email or web address] - password [email or web address] [facility]  [facility]  Office  [phone]  CC: Primary care physician; Kaleen Mask, MD

## 2015-03-13 NOTE — Plan of Care (Signed)
Problem: Physical Regulation: Goal: Complications related to the disease process, condition or treatment will be avoided or minimized Outcome: Progressing Patient admitted from ED via  [facility] from [facility] he experienced near syncopal episode and blood stool, Patient lives a group home, does have history of dementia has been alert and oriented to self and place.  Patient has been slightly hypotensive at times.  Hg has dropped from 15.9 at 1600 to 13.5 at 0320.  Patient has had one small loose BM since admission with streaks of what appeared to be red blood.  Saturations were low on RA prime doc made aware and patient was placed on oxygen at 2 liters and saturations up to high 90's PRN breathing treatments ordered not had to use tonight.  Plan was for observation overnight and if Hg continued to drop GI would be consulted.

## 2015-03-13 NOTE — Plan of Care (Addendum)
Problem: Nutrition: Goal: Adequate nutrition will be maintained Outcome: Progressing Pt is alert and oriented, denies pain, bed bound at this time, mobile in bed, weaned from oxygen, lungs with coarse sounds and wet cough, IV fluids d/c and given IV lasix once. Good urine output, small bm throughout shift, hgb stable at 13.6, sister visiting at bedside, [place] to [place] at group home, pt likely to discharge on 11/12, GI not consulted at this time. Febrile throughout shift, vital signs stable, agitated at times. Uneventful shift.

## 2015-03-13 NOTE — Clinical Social Work Note (Signed)
Clinical Social Work Assessment  Patient Details  Name: Cameron Ford MRN: [id number] Date of Birth: 08-Jun-1945  Date of referral:  03/13/15               Reason for consult:  Facility Placement                Permission sought to share information with:  Guardian, Customer service manager Permission granted to share information::  Yes, Verbal Permission Granted  Name::      Cameron Ford with [facility] [phone]))  Agency::   ([facility])  Relationship::   (group home and guardian)  Contact Information:   Facility is calling with information, will update with note.   Housing/Transportation Living arrangements for the past 2 months:  [place] of Information:  Patient, Case Manager Patient Interpreter Needed:  None Criminal Activity/Legal Involvement Pertinent to Current Situation/Hospitalization:  No - Comment as needed Significant Relationships:  Siblings, Mental Health Provider, Other(Comment) Games developer) Lives with:  Facility Resident Do you feel safe going back to the place where you live?  Yes Need for family participation in patient care:  Yes (Comment)  Care giving concerns:  No care giving concerns identified.   Social Worker assessment / plan:  CSW met with pt to address consult as pt was admitted from [facility], which is a group home. CSW introduced herself and explained role of social work. CSW also explained the process of returning to group home. Pt provided information on a card to verify group home contact information as he was not able to recall where he lived.   Contact as follows: [facility] [address] [place] [address] [phone] Cameron Ford (administrator) [phone] Cameron Ford (supervisor in charge) [phone] Cameron Stalls, RN (owner)   CSW spoke with Cameron Ford and confirmed that pt lived at group home and has a guardian as pt is ward  of the state. Pt's guardian is in [place]. Cameron Ford is providing guardian contact information. Cameron Ford shared that pt is unable to read and write and has spent 35 years incarcerated. Pt was admitted from a SNF for STR prior to going to group home.   Pt is able to return when medically stable. Facility will provided transportation. CSW will continue to follow.   Employment status:  Retired Forensic scientist:  Medicaid In [place], [employer] PT Recommendations:  Not assessed at this time Information / Referral to community resources:  Other (Comment Required) (Facility and [place])  Patient/Family's Response to care:  Pt would like to return to the group home at discharge.   Patient/Family's Understanding of and Emotional Response to Diagnosis, Current Treatment, and Prognosis:  Group home is able to provide the needed level of care.   Emotional Assessment Appearance:  Appears older than stated age Attitude/Demeanor/Rapport:  Apprehensive Affect (typically observed):  Blunt, Guarded Orientation:  Oriented to Self, Oriented to Situation, Oriented to Place Alcohol / Substance use:  Other (history) Psych involvement (Current and /or in the community):  Outpatient Provider  Discharge Needs  Concerns to be addressed:  No discharge needs identified Readmission within the last 30 days:  No Current discharge risk:  Chronically ill Barriers to Discharge:  No Barriers Identified   Cameron Dates, LCSW 03/13/2015, 11:41 AM

## 2015-03-13 NOTE — Care Management (Signed)
Mr. Mikey BussingMcQueen's guardian is Sharyon MedicusJulis Monroe, Child psychotherapistocial Worker for [place] [employer].  [phone]). Left voice regarding Medicare Observation Status Notification letter. Gwenette GreetBrenda S Szymon Foiles RN MSN CCM Care Management [phone]

## 2015-03-13 NOTE — Progress Notes (Signed)
Notified Dr. Amado CoeGouru that patient lungs sound are coarse and patient has rpoductive cough, on room air now with oxygen saturation at 93 %. Telephone conversation.

## 2015-03-14 DIAGNOSIS — K922 Gastrointestinal hemorrhage, unspecified: Secondary | ICD-10-CM | POA: Diagnosis not present

## 2015-03-14 LAB — CBC
HEMATOCRIT: 41.2 % (ref 40.0–52.0)
Hemoglobin: 14 g/dL (ref 13.0–18.0)
MCH: 35 pg — ABNORMAL HIGH (ref 26.0–34.0)
MCHC: 34 g/dL (ref 32.0–36.0)
MCV: 102.8 fL — AB (ref 80.0–100.0)
PLATELETS: 261 10*3/uL (ref 150–440)
RBC: 4.01 MIL/uL — ABNORMAL LOW (ref 4.40–5.90)
RDW: 13.7 % (ref 11.5–14.5)
WBC: 8.4 10*3/uL (ref 3.8–10.6)

## 2015-03-14 LAB — BASIC METABOLIC PANEL
Anion gap: 5 (ref 5–15)
BUN: 16 mg/dL (ref 6–20)
CALCIUM: 9 mg/dL (ref 8.9–10.3)
CO2: 32 mmol/L (ref 22–32)
CREATININE: 0.85 mg/dL (ref 0.61–1.24)
Chloride: 99 mmol/L — ABNORMAL LOW (ref 101–111)
GFR calc Af Amer: >60 mL/min (ref >60)
GLUCOSE: 102 mg/dL — AB (ref 65–99)
Potassium: 4.7 mmol/L (ref 3.5–5.1)
Sodium: 136 mmol/L (ref 135–145)

## 2015-03-14 LAB — LITHIUM LEVEL: Lithium Lvl: 0.72 mmol/L (ref 0.60–1.20)

## 2015-03-14 MED ORDER — HYDROCORTISONE 2.5 % RE CREA: TOPICAL_CREAM | Freq: Two times a day (BID) | RECTAL | Status: DC | PRN

## 2015-03-14 MED ORDER — HYDROCORTISONE 2.5 % RE CREA
TOPICAL_CREAM | Freq: Two times a day (BID) | RECTAL | Status: DC | PRN
  Filled 2015-03-14: qty 28.35

## 2015-03-14 NOTE — NC FL2 (Addendum)
[place] MEDICAID FL2 LEVEL OF CARE SCREENING TOOL     IDENTIFICATION  Patient Name: Cameron Ford Birthdate: Sep 14, 1945 Sex: male Admission Date (Current Location): 03/12/2015  [place]  [id number] [facility] Facility and Address:  [facility], [address], [place], [place] [address]      Provider Number: [id number]  Attending Physician Name and Address:  Ramonita LabAruna Gouru, MD  Relative Name and Phone Number:       Current Level of Care: Hospital Recommended Level of Care: Family Care Home Prior Approval Number:    Date Approved/Denied:   PASRR Number:    Discharge Plan:  [facility]    Current Diagnoses: Patient Active Problem List   Diagnosis Date Noted  . GI bleed 03/12/2015    Orientation ACTIVITIES/SOCIAL BLADDER RESPIRATION    Self, Time, Situation, Place  Active Incontinent Normal  BEHAVIORAL SYMPTOMS/MOOD NEUROLOGICAL BOWEL NUTRITION STATUS   (none)  (none) Incontinent Diet (soft)  PHYSICIAN VISITS COMMUNICATION OF NEEDS Height & Weight Skin  30 days Verbally 5\' 8"  (172.7 cm) 176 lbs. Normal          AMBULATORY STATUS RESPIRATION    Supervision limited Normal      Personal Care Assistance Level of Assistance   (supervision)            Functional Limitations Info  Hearing   Hearing Info: Adequate         SPECIAL CARE FACTORS FREQUENCY                      Additional Factors Info  Code Status, Allergies Code Status Info: Full Allergies Info: nka           Current Medications (03/14/2015): Current Facility-Administered Medications  Medication Dose Route Frequency Provider Last Rate Last Dose  . acetaminophen (TYLENOL) tablet 650 mg  650 mg Oral Q6H PRN Houston SirenVivek J Sainani, MD   650 mg at 03/13/15 1708   Or  . acetaminophen (TYLENOL) suppository 650 mg  650 mg Rectal Q6H PRN Houston SirenVivek J Sainani, MD      . atorvastatin (LIPITOR) tablet 20 mg  20 mg Oral QHS Houston SirenVivek J Sainani, MD   20 mg at  03/13/15 2110  . benztropine (COGENTIN) tablet 0.5 mg  0.5 mg Oral 3 times per day Houston SirenVivek J Sainani, MD   0.5 mg at 03/14/15 0533  . carbamazepine (TEGRETOL) tablet 200 mg  200 mg Oral BID Houston SirenVivek J Sainani, MD   200 mg at 03/14/15 0909  . hydrocortisone (ANUSOL-HC) 2.5 % rectal cream   Rectal BID PRN Ramonita LabAruna Gouru, MD      . ipratropium-albuterol (DUONEB) 0.5-2.5 (3) MG/3ML nebulizer solution 3 mL  3 mL Nebulization Q4H PRN Oralia Manisavid Willis, MD      . lisinopril (PRINIVIL,ZESTRIL) tablet 10 mg  10 mg Oral Daily Houston SirenVivek J Sainani, MD   10 mg at 03/14/15 0855  . lithium carbonate capsule 300 mg  300 mg Oral QHS Houston SirenVivek J Sainani, MD   300 mg at 03/13/15 2110  . lubiprostone (AMITIZA) capsule 8 mcg  8 mcg Oral BID WC Houston SirenVivek J Sainani, MD   8 mcg at 03/14/15 0855  . ondansetron (ZOFRAN) tablet 4 mg  4 mg Oral Q6H PRN Houston SirenVivek J Sainani, MD       Or  . ondansetron (ZOFRAN) injection 4 mg  4 mg Intravenous Q6H PRN Houston SirenVivek J Sainani, MD      . propranolol (INDERAL) tablet 40 mg  40 mg Oral BID Houston Siren, MD   40 mg at 03/14/15 0855  . risperiDONE (RISPERDAL) tablet 1 mg  1 mg Oral 3 times per day Houston Siren, MD   1 mg at 03/14/15 0533   Do not use this list as official medication orders. Please verify with discharge summary.  Discharge Medications:   Medication List    TAKE these medications        acetaminophen 325 MG tablet  Commonly known as:  TYLENOL  Take 650 mg by mouth 2 (two) times daily.     aspirin 81 MG tablet  Take 81 mg by mouth daily.     atorvastatin 20 MG tablet  Commonly known as:  LIPITOR  Take 20 mg by mouth daily.     benztropine 0.5 MG tablet  Commonly known as:  COGENTIN  Take 0.5 mg by mouth every 8 (eight) hours.     carbamazepine 200 MG tablet  Commonly known as:  TEGRETOL  Take 200 mg by mouth 2 (two) times daily.     hydrocortisone 2.5 % rectal cream  Commonly known as:  ANUSOL-HC  Place rectally 2 (two) times daily as needed for hemorrhoids or itching.      lisinopril 10 MG tablet  Commonly known as:  PRINIVIL,ZESTRIL  Take 10 mg by mouth daily.     lithium carbonate 300 MG capsule  Take 300 mg by mouth at bedtime.     lubiprostone 8 MCG capsule  Commonly known as:  AMITIZA  Take 8 mcg by mouth 2 (two) times daily with a meal.     polyethylene glycol packet  Commonly known as:  MIRALAX / GLYCOLAX  Take 17 g by mouth daily as needed for mild constipation or moderate constipation.     propranolol 40 MG tablet  Commonly known as:  INDERAL  Take 40 mg by mouth 2 (two) times daily.     risperiDONE 1 MG tablet  Commonly known as:  RISPERDAL  Take 1 mg by mouth every 8 (eight) hours.       START taking these medications   Details  hydrocortisone (ANUSOL-HC) 2.5 % rectal cream Place rectally 2 (two) times daily as needed for hemorrhoids or itching. Qty: 30 g, Refills: 0           Relevant Imaging Results:  Relevant Lab Results:  Recent Labs    Additional Information    York Spaniel, LCSW

## 2015-03-14 NOTE — Progress Notes (Signed)
Pt is alert and oriented, good appetite, denies pain, incontinent of urine once, good urine output, bed bound but mobile in bed at this time, no bm throughout shift, pt will d/c to group home, d/c instructions reviewed with Maurine Ministerennis and patient, Rx for anusol provided to patient, hgb at 14.0. Assist provided to dressing patient and pushing patient to visitor entrance. Dennis from group home understands d/c instructions and has no further questions at this time. Uneventful shift.

## 2015-03-14 NOTE — Clinical Social Work Note (Signed)
MD to discharge [facility] by [place]. Fl2 prepared and has MD signature and patient's nurse aware and to call Maurine MinisterDennis for transportation back to the group home. CSW has left a message for patient's guardian: Ms. Archie PattenMonroe at [phone] and also spoke with the DSS On Call worker at : [phone] to inform them of patient's discharge. York SpanielMonica Chasyn Cinque MSW,LCSW [phone]

## 2015-03-14 NOTE — Discharge Summary (Signed)
[facility] - [place] at [facility]   PATIENT NAME: Cameron Ford    MR#:  [id number]  DATE OF BIRTH:  07-Jun-1945  DATE OF ADMISSION:  03/12/2015 ADMITTING PHYSICIAN: Houston Siren, MD  DATE OF DISCHARGE: 03/14/2015 PRIMARY CARE PHYSICIAN: Kaleen Mask, MD    ADMISSION DIAGNOSIS:  Gastrointestinal hemorrhage, unspecified gastritis, unspecified gastrointestinal hemorrhage type [K92.2]  DISCHARGE DIAGNOSIS:  Active Problems:   GI bleed   SECONDARY DIAGNOSIS:   Past Medical History  Diagnosis Date  . Dementia   . Bipolar 1 disorder (HCC)   . Hyperlipidemia   . Microalbuminuria   . Neuromuscular disorder (HCC)     tremors  . Chronic constipation   . History of small bowel obstruction   . Hypertension   . Stroke [facility])     HOSPITAL COURSE:   69 year old male with past history of hypertension, bipolar disorder, dementia, hyperlipidemia, history of previous CVA who presented to the hospital due to multiple episodes of rectal bleeding.  #1 GI bleed-this is likely a lower GI bleed given his rectal bleeding. probably hemorrhoidal bleeding  recent colonoscopy was noted to have internal & external Hemorrhoids. -Hemoglobin is stable at 14.0 -No other episodes of bloody bowel movements were noticed  -Anusol H as needed for hemorrhoidal bleed  -Advance diet as tolerated  -Discharge patient to group home  -#2 history of bipolar disorder-continue Tegretol, lithium. I will check a lithium level.  #3 history of dementia-continue Risperdal, Cogentin.  #4 hyperlipidemia-continue atorvastatin.  #5 hypertension-continue lisinopril.  DISCHARGE CONDITIONS:   Fair  CONSULTS OBTAINED:  Treatment Team:  Ramonita Lab, MD   PROCEDURES none  DRUG ALLERGIES:  No Known Allergies  DISCHARGE MEDICATIONS:   Current Discharge Medication List    START taking these medications   Details  hydrocortisone (ANUSOL-HC) 2.5 % rectal cream Place  rectally 2 (two) times daily as needed for hemorrhoids or itching. Qty: 30 g, Refills: 0      CONTINUE these medications which have NOT CHANGED   Details  acetaminophen (TYLENOL) 325 MG tablet Take 650 mg by mouth 2 (two) times daily.    aspirin 81 MG tablet Take 81 mg by mouth daily.    atorvastatin (LIPITOR) 20 MG tablet Take 20 mg by mouth daily.    benztropine (COGENTIN) 0.5 MG tablet Take 0.5 mg by mouth every 8 (eight) hours.     carbamazepine (TEGRETOL) 200 MG tablet Take 200 mg by mouth 2 (two) times daily.     lisinopril (PRINIVIL,ZESTRIL) 10 MG tablet Take 10 mg by mouth daily.    lithium carbonate 300 MG capsule Take 300 mg by mouth at bedtime.     lubiprostone (AMITIZA) 8 MCG capsule Take 8 mcg by mouth 2 (two) times daily with a meal.    polyethylene glycol (MIRALAX / GLYCOLAX) packet Take 17 g by mouth daily as needed for mild constipation or moderate constipation. Qty: 14 each, Refills: 0    propranolol (INDERAL) 40 MG tablet Take 40 mg by mouth 2 (two) times daily.     risperiDONE (RISPERDAL) 1 MG tablet Take 1 mg by mouth every 8 (eight) hours.          DISCHARGE INSTRUCTIONS:  Activity as tolerated Diet low-salt Follow-up with primary care physician in 3-5 days at the facility    DIET:  Low-salt  DISCHARGE CONDITION:  Fair  ACTIVITY:  Activity as tolerated  OXYGEN:  Home Oxygen: No.   Oxygen Delivery: room air  DISCHARGE LOCATION:  group home   If you experience worsening of your admission symptoms, develop shortness of breath, life threatening emergency, suicidal or homicidal thoughts you must seek medical attention immediately by calling 911 or calling your MD immediately  if symptoms less severe.  You Must read complete instructions/literature along with all the possible adverse reactions/side effects for all the Medicines you take and that have been prescribed to you. Take any new Medicines after you have completely understood and  accpet all the possible adverse reactions/side effects.   Please note  You were cared for by a hospitalist during your hospital stay. If you have any questions about your discharge medications or the care you received while you were in the hospital after you are discharged, you can call the unit and asked to speak with the hospitalist on call if the hospitalist that took care of you is not available. Once you are discharged, your primary care physician will handle any further medical issues. Please note that NO REFILLS for any discharge medications will be authorized once you are discharged, as it is imperative that you return to your primary care physician (or establish a relationship with a primary care physician if you do not have one) for your aftercare needs so that they can reassess your need for medications and monitor your lab values.     Today  Chief Complaint  Patient presents with  . Diarrhea  . Abdominal Pain   Patient is resting comfortably. Denies any other episodes of bowel movements with blood. Denies any abdominal pain either  ROS:  CONSTITUTIONAL: Denies fevers, chills. Denies any fatigue, weakness.  EYES: Denies blurry vision, double vision, eye pain. EARS, NOSE, THROAT: Denies tinnitus, ear pain, hearing loss. RESPIRATORY: Denies cough, wheeze, shortness of breath.  CARDIOVASCULAR: Denies chest pain, palpitations, edema.  GASTROINTESTINAL: Denies nausea, vomiting, diarrhea, abdominal pain. Denies bright red blood per rectum. GENITOURINARY: Denies dysuria, hematuria. ENDOCRINE: Denies nocturia or thyroid problems. HEMATOLOGIC AND LYMPHATIC: Denies easy bruising or bleeding. SKIN: Denies rash or lesion. MUSCULOSKELETAL: Denies pain in neck, back, shoulder, knees, hips or arthritic symptoms.  NEUROLOGIC: Denies paralysis, paresthesias.  PSYCHIATRIC: Denies anxiety or depressive symptoms.   VITAL SIGNS:  Blood pressure 100/51, pulse 55, temperature 98.3 F (36.8 C),  temperature source Oral, resp. rate 19, height 5\' 8"  (1.727 m), weight 80.105 kg (176 lb 9.6 oz), SpO2 92 %.  I/O:   Intake/Output Summary (Last 24 hours) at 03/14/15 1114 Last data filed at 03/14/15 0900  Gross per 24 hour  Intake   1215 ml  Output   1995 ml  Net   -780 ml    PHYSICAL EXAMINATION:  GENERAL:  69 y.o.-year-old patient lying in the bed with no acute distress.  EYES: Pupils equal, round, reactive to light and accommodation. No scleral icterus. Extraocular muscles intact.  HEENT: Head atraumatic, normocephalic. Oropharynx and nasopharynx clear.  NECK:  Supple, no jugular venous distention. No thyroid enlargement, no tenderness.  LUNGS: Normal breath sounds bilaterally, no wheezing, rales,rhonchi or crepitation. No use of accessory muscles of respiration.  CARDIOVASCULAR: S1, S2 normal. No murmurs, rubs, or gallops.  ABDOMEN: Soft, non-tender, non-distended. Midline incision from previous surgery is healing well except with 1 cm gaping at the lower end of the scar Bowel sounds present. No organomegaly or mass.  EXTREMITIES: No pedal edema, cyanosis, or clubbing.  NEUROLOGIC: Cranial nerves  grossly intact.muscle strength 5/5 in all extremities. Sensation intact. Gait not checked.  PSYCHIATRIC: The patient is alert and oriented x 3.  SKIN: No obvious rash, lesion, or ulcer.   DATA REVIEW:   CBC  Recent Labs Lab 03/14/15 0504  WBC 8.4  HGB 14.0  HCT 41.2  PLT 261    Chemistries   Recent Labs Lab 03/12/15 1559  03/14/15 0504  NA 136  < > 136  K 5.0  < > 4.7  CL 103  < > 99*  CO2 29  < > 32  GLUCOSE 108*  < > 102*  BUN 21*  < > 16  CREATININE 1.07  < > 0.85  CALCIUM 8.9  < > 9.0  AST 29  --   --   ALT 19  --   --   ALKPHOS 80  --   --   BILITOT 1.0  --   --   < > = values in this interval not displayed.  Cardiac Enzymes  Recent Labs Lab 03/12/15 1559  TROPONINI <0.03    Microbiology Results  No results found for this or any previous  visit.  RADIOLOGY:  No results found.  EKG:   Orders placed or performed during the hospital encounter of 12/18/14  . ED EKG  . ED EKG  . EKG      Management plans discussed with the patient, family and they are in agreement.  CODE STATUS:     Code Status Orders        Start     Ordered   03/12/15 2129  Full code   Continuous     03/12/15 2128      TOTAL TIME TAKING CARE OF THIS PATIENT: 45  minutes.    @  on 03/14/2015 at 11:14 AM  Between 7am to 6pm - Pager - [phone]  After 6pm go to [email or web address] - password [email or web address] [facility]  [facility]  Office  [phone]  CC: Primary care physician; Kaleen Mask, MD

## 2015-03-14 NOTE — Progress Notes (Signed)
Dennis from group home had questions concerning paasar, Rosario JacksMonica-CSW was called and she came down and talked to [place].

## 2015-03-14 NOTE — Discharge Instructions (Signed)
Activity as tolerated Diet low-salt Follow-up with primary care physician in 3-5 days at the facility

## 2015-04-09 ENCOUNTER — Emergency Department: Payer: Medicare Other

## 2015-04-09 ENCOUNTER — Encounter: Payer: Self-pay | Admitting: Emergency Medicine

## 2015-04-09 ENCOUNTER — Emergency Department
Admission: EM | Admit: 2015-04-09 | Discharge: 2015-04-10 | Disposition: A | Discharge status: Home / Self Care | Payer: Medicare Other | Attending: Emergency Medicine | Admitting: Emergency Medicine

## 2015-04-09 DIAGNOSIS — R1084 Generalized abdominal pain: Secondary | ICD-10-CM | POA: Diagnosis present

## 2015-04-09 DIAGNOSIS — Z79899 Other long term (current) drug therapy: Secondary | ICD-10-CM | POA: Diagnosis not present

## 2015-04-09 DIAGNOSIS — G8929 Other chronic pain: Secondary | ICD-10-CM | POA: Insufficient documentation

## 2015-04-09 DIAGNOSIS — K5900 Constipation, unspecified: Secondary | ICD-10-CM | POA: Insufficient documentation

## 2015-04-09 DIAGNOSIS — I10 Essential (primary) hypertension: Secondary | ICD-10-CM | POA: Diagnosis not present

## 2015-04-09 DIAGNOSIS — Z7982 Long term (current) use of aspirin: Secondary | ICD-10-CM | POA: Diagnosis not present

## 2015-04-09 DIAGNOSIS — F17200 Nicotine dependence, unspecified, uncomplicated: Secondary | ICD-10-CM | POA: Insufficient documentation

## 2015-04-09 HISTORY — DX: Unspecified asthma, uncomplicated: J45.909

## 2015-04-09 LAB — CBC WITH DIFFERENTIAL/PLATELET
Basophils Absolute: 0.1 10*3/uL (ref 0–0.1)
Basophils Relative: 1 %
EOS PCT: 3 %
Eosinophils Absolute: 0.2 10*3/uL (ref 0–0.7)
HCT: 49.6 % (ref 40.0–52.0)
Hemoglobin: 16.4 g/dL (ref 13.0–18.0)
LYMPHS ABS: 2.5 10*3/uL (ref 1.0–3.6)
LYMPHS PCT: 30 %
MCH: 33.8 pg (ref 26.0–34.0)
MCHC: 33.2 g/dL (ref 32.0–36.0)
MCV: 102 fL — AB (ref 80.0–100.0)
MONO ABS: 0.6 10*3/uL (ref 0.2–1.0)
MONOS PCT: 8 %
Neutro Abs: 4.8 10*3/uL (ref 1.4–6.5)
Neutrophils Relative %: 58 %
PLATELETS: 346 10*3/uL (ref 150–440)
RBC: 4.86 MIL/uL (ref 4.40–5.90)
RDW: 13.8 % (ref 11.5–14.5)
WBC: 8.2 10*3/uL (ref 3.8–10.6)

## 2015-04-09 LAB — COMPREHENSIVE METABOLIC PANEL
ALT: 57 U/L (ref 17–63)
AST: 159 U/L — AB (ref 15–41)
Albumin: 4.2 g/dL (ref 3.5–5.0)
Alkaline Phosphatase: 126 U/L (ref 38–126)
Anion gap: 7 (ref 5–15)
BUN: 19 mg/dL (ref 6–20)
CHLORIDE: 96 mmol/L — AB (ref 101–111)
CO2: 32 mmol/L (ref 22–32)
CREATININE: 1.07 mg/dL (ref 0.61–1.24)
Calcium: 9.3 mg/dL (ref 8.9–10.3)
GFR calc Af Amer: >60 mL/min (ref >60)
GLUCOSE: 127 mg/dL — AB (ref 65–99)
Potassium: 4.4 mmol/L (ref 3.5–5.1)
Sodium: 135 mmol/L (ref 135–145)
Total Bilirubin: 1 mg/dL (ref 0.3–1.2)
Total Protein: 8.2 g/dL — ABNORMAL HIGH (ref 6.5–8.1)

## 2015-04-09 LAB — TROPONIN I

## 2015-04-09 MED ORDER — SODIUM CHLORIDE 0.9 % IV BOLUS (SEPSIS)
1000.0000 mL | Freq: Once | INTRAVENOUS | Status: AC
  Administered 2015-04-09: 1000 mL via INTRAVENOUS

## 2015-04-09 MED ORDER — IOHEXOL 300 MG/ML  SOLN
100.0000 mL | Freq: Once | INTRAMUSCULAR | Status: AC | PRN
Start: 2015-04-09 — End: 2015-04-09
  Administered 2015-04-09: 100 mL via INTRAVENOUS

## 2015-04-09 MED ORDER — IOHEXOL 240 MG/ML SOLN
25.0000 mL | Freq: Once | INTRAMUSCULAR | Status: AC | PRN
  Administered 2015-04-09: 25 mL via ORAL

## 2015-04-09 MED ORDER — KETOROLAC TROMETHAMINE 30 MG/ML IJ SOLN
30.0000 mg | Freq: Once | INTRAMUSCULAR | Status: AC
  Administered 2015-04-09: 30 mg via INTRAVENOUS
  Filled 2015-04-09: qty 1

## 2015-04-09 NOTE — ED Notes (Signed)
Pt to ED via EMS from group home c/o abd pain starting tonight around 2100.  Pt states pain came on gradually after eating dinner tonight to middle of abd.  Denies n/v/d, denies urinary symptoms.  Pt A&Ox4, speaking in complete and coherent sentences and in NAD at this time.
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
## 2015-04-10 DIAGNOSIS — K5900 Constipation, unspecified: Secondary | ICD-10-CM | POA: Diagnosis not present

## 2015-04-10 LAB — URINALYSIS COMPLETE WITH MICROSCOPIC (ARMC ONLY)
BILIRUBIN URINE: NEGATIVE
Bacteria, UA: NONE SEEN
GLUCOSE, UA: NEGATIVE mg/dL
Hgb urine dipstick: NEGATIVE
Ketones, ur: NEGATIVE mg/dL
Leukocytes, UA: NEGATIVE
NITRITE: NEGATIVE
PH: 6 (ref 5.0–8.0)
Protein, ur: NEGATIVE mg/dL
Specific Gravity, Urine: 1.032 — ABNORMAL HIGH (ref 1.005–1.030)
Squamous Epithelial / LPF: NONE SEEN

## 2015-04-10 MED ORDER — LACTULOSE 10 GM/15ML PO SOLN: 20.0000 g | Freq: Every day | ORAL | Status: DC | PRN

## 2015-04-10 NOTE — Discharge Instructions (Signed)
1. Take laxative as prescribed (Lactulose). 2. Drink plenty of fluids daily. 3. Return to the ER for worsening symptoms, persistent vomiting, difficulty breathing or other concerns.  Abdominal Pain, Adult Many things can cause abdominal pain. Usually, abdominal pain is not caused by a disease and will improve without treatment. It can often be observed and treated at home. Your health care provider will do a physical exam and possibly order blood tests and X-rays to help determine the seriousness of your pain. However, in many cases, more time must pass before a clear cause of the pain can be found. Before that point, your health care provider may not know if you need more testing or further treatment. HOME CARE INSTRUCTIONS Monitor your abdominal pain for any changes. The following actions may help to alleviate any discomfort you are experiencing:  Only take over-the-counter or prescription medicines as directed by your health care provider.  Do not take laxatives unless directed to do so by your health care provider.  Try a clear liquid diet (broth, tea, or water) as directed by your health care provider. Slowly move to a bland diet as tolerated. SEEK MEDICAL CARE IF:  You have unexplained abdominal pain.  You have abdominal pain associated with nausea or diarrhea.  You have pain when you urinate or have a bowel movement.  You experience abdominal pain that wakes you in the night.  You have abdominal pain that is worsened or improved by eating food.  You have abdominal pain that is worsened with eating fatty foods.  You have a fever. SEEK IMMEDIATE MEDICAL CARE IF:  Your pain does not go away within 2 hours.  You keep throwing up (vomiting).  Your pain is felt only in portions of the abdomen, such as the right side or the left lower portion of the abdomen.  You pass bloody or black tarry stools. MAKE SURE YOU:  Understand these instructions.  Will watch your  condition.  Will get help right away if you are not doing well or get worse.   This information is not intended to replace advice given to you by your health care provider. Make sure you discuss any questions you have with your health care provider.   Document Released: 01/26/2005 Document Revised: 01/07/2015 Document Reviewed: 12/26/2012 Elsevier Interactive Patient Education 2016 [employer]  Constipation, Adult Constipation is when a person has fewer than three bowel movements a week, has difficulty having a bowel movement, or has stools that are dry, hard, or larger than normal. As people grow older, constipation is more common. A low-fiber diet, not taking in enough fluids, and taking certain medicines may make constipation worse.  CAUSES   Certain medicines, such as antidepressants, pain medicine, iron supplements, antacids, and water pills.   Certain diseases, such as diabetes, irritable bowel syndrome (IBS), thyroid disease, or depression.   Not drinking enough water.   Not eating enough fiber-rich foods.   Stress or travel.   Lack of physical activity or exercise.   Ignoring the urge to have a bowel movement.   Using laxatives too much.  SIGNS AND SYMPTOMS   Having fewer than three bowel movements a week.   Straining to have a bowel movement.   Having stools that are hard, dry, or larger than normal.   Feeling full or bloated.   Pain in the lower abdomen.   Not feeling relief after having a bowel movement.  DIAGNOSIS  Your health care provider will take a medical history and  perform a physical exam. Further testing may be done for severe constipation. Some tests may include:  A barium enema X-ray to examine your rectum, colon, and, sometimes, your small intestine.   A sigmoidoscopy to examine your lower colon.   A colonoscopy to examine your entire colon. TREATMENT  Treatment will depend on the severity of your constipation and what is  causing it. Some dietary treatments include drinking more fluids and eating more fiber-rich foods. Lifestyle treatments may include regular exercise. If these diet and lifestyle recommendations do not help, your health care provider may recommend taking over-the-counter laxative medicines to help you have bowel movements. Prescription medicines may be prescribed if over-the-counter medicines do not work.  HOME CARE INSTRUCTIONS   Eat foods that have a lot of fiber, such as fruits, vegetables, whole grains, and beans.  Limit foods high in fat and processed sugars, such as french fries, hamburgers, cookies, candies, and soda.   A fiber supplement may be added to your diet if you cannot get enough fiber from foods.   Drink enough fluids to keep your urine clear or pale yellow.   Exercise regularly or as directed by your health care provider.   Go to the restroom when you have the urge to go. Do not hold it.   Only take over-the-counter or prescription medicines as directed by your health care provider. Do not take other medicines for constipation without talking to your health care provider first.  SEEK IMMEDIATE MEDICAL CARE IF:   You have bright red blood in your stool.   Your constipation lasts for more than 4 days or gets worse.   You have abdominal or rectal pain.   You have thin, pencil-like stools.   You have unexplained weight loss. MAKE SURE YOU:   Understand these instructions.  Will watch your condition.  Will get help right away if you are not doing well or get worse.   This information is not intended to replace advice given to you by your health care provider. Make sure you discuss any questions you have with your health care provider.   Document Released: 01/15/2004 Document Revised: 05/09/2014 Document Reviewed: 01/28/2013 Elsevier Interactive Patient Education 2016 Elsevier Inc.  High-Fiber Diet Fiber, also called dietary fiber, is a type of  carbohydrate found in fruits, vegetables, whole grains, and beans. A high-fiber diet can have many health benefits. Your health care provider may recommend a high-fiber diet to help:  Prevent constipation. Fiber can make your bowel movements more regular.  Lower your cholesterol.  Relieve hemorrhoids, uncomplicated diverticulosis, or irritable bowel syndrome.  Prevent overeating as part of a weight-loss plan.  Prevent heart disease, type 2 diabetes, and certain cancers. WHAT IS MY PLAN? The recommended daily intake of fiber includes:  38 grams for men under age 51.  30 grams for men over age 57.  25 grams for women under age 35.  21 grams for women over age 74. You can get the recommended daily intake of dietary fiber by eating a variety of fruits, vegetables, grains, and beans. Your health care provider may also recommend a fiber supplement if it is not possible to get enough fiber through your diet. WHAT DO I NEED TO KNOW ABOUT A HIGH-FIBER DIET?  Fiber supplements have not been widely studied for their effectiveness, so it is better to get fiber through food sources.  Always check the fiber content on thenutrition facts label of any prepackaged food. Look for foods that contain at least  5 grams of fiber per serving.  Ask your dietitian if you have questions about specific foods that are related to your condition, especially if those foods are not listed in the following section.  Increase your daily fiber consumption gradually. Increasing your intake of dietary fiber too quickly may cause bloating, cramping, or gas.  Drink plenty of water. Water helps you to digest fiber. WHAT FOODS CAN I EAT? Grains Whole-grain breads. Multigrain cereal. Oats and oatmeal. Brown rice. Barley. Bulgur wheat. Millet. Bran muffins. Popcorn. Rye wafer crackers. Vegetables Sweet potatoes. Spinach. Kale. Artichokes. Cabbage. Broccoli. Green peas. Carrots. Squash. Fruits Berries. Pears. Apples.  Oranges. Avocados. Prunes and raisins. Dried figs. Meats and Other Protein Sources Navy, kidney, pinto, and soy beans. Split peas. Lentils. Nuts and seeds. Dairy Fiber-fortified yogurt. Beverages Fiber-fortified soy milk. Fiber-fortified orange juice. Other Fiber bars. The items listed above may not be a complete list of recommended foods or beverages. Contact your dietitian for more options. WHAT FOODS ARE NOT RECOMMENDED? Grains White bread. Pasta made with refined flour. White rice. Vegetables Fried potatoes. Canned vegetables. Well-cooked vegetables.  Fruits Fruit juice. Cooked, strained fruit. Meats and Other Protein Sources Fatty cuts of meat. Fried Environmental education officer or fried fish. Dairy Milk. Yogurt. Cream cheese. Sour cream. Beverages Soft drinks. Other Cakes and pastries. Butter and oils. The items listed above may not be a complete list of foods and beverages to avoid. Contact your dietitian for more information. WHAT ARE SOME TIPS FOR INCLUDING HIGH-FIBER FOODS IN MY DIET?  Eat a wide variety of high-fiber foods.  Make sure that half of all grains consumed each day are whole grains.  Replace breads and cereals made from refined flour or white flour with whole-grain breads and cereals.  Replace white rice with brown rice, bulgur wheat, or millet.  Start the day with a breakfast that is high in fiber, such as a cereal that contains at least 5 grams of fiber per serving.  Use beans in place of meat in soups, salads, or pasta.  Eat high-fiber snacks, such as berries, raw vegetables, nuts, or popcorn.   This information is not intended to replace advice given to you by your health care provider. Make sure you discuss any questions you have with your health care provider.   Document Released: 04/18/2005 Document Revised: 05/09/2014 Document Reviewed: 10/01/2013 Elsevier Interactive Patient Education [employer]

## 2015-04-10 NOTE — ED Notes (Signed)
Waiting for caregiver to arrive. Patient is dressed and sitting in wheelchair, wrapped in a blanket. Patient remains in his room.

## 2015-04-10 NOTE — ED Provider Notes (Signed)
-----------------------------------------   00:01 AM on 04/10/2015 -----------------------------------------  CT abdomen/pelvis interpreted per Dr. Gwenyth Benderadparvar: Constipation. No evidence of bowel obstruction or inflammation. Normal appendix.  Awaiting urine specimen. Patient encouraged to provide sample.   ----------------------------------------- 1:02 AM on 04/10/2015 -----------------------------------------  Updated patient of urinalysis results. Will place on lactulose for constipation. Strict return precautions given. Patient verbalizes understanding and agrees with plan of care.    Irean HongJade J Sung, MD 04/10/15 [phone]

## 2015-04-10 NOTE — ED Notes (Signed)
Report called to Alden [facility]Hazel Ford at [facility], who stated that she will be here to pick up the patient.

## 2016-01-26 ENCOUNTER — Other Ambulatory Visit: Payer: Self-pay | Admitting: Family Medicine

## 2016-01-26 DIAGNOSIS — R0609 Other forms of dyspnea: Principal | ICD-10-CM

## 2016-02-01 ENCOUNTER — Inpatient Hospital Stay
Admission: RE | Admit: 2016-02-01 | Discharge: 2016-02-01 | Disposition: A | Discharge status: Home / Self Care | Payer: Medicare Other | Source: Ambulatory Visit | Attending: Family Medicine | Admitting: Family Medicine

## 2016-02-02 ENCOUNTER — Other Ambulatory Visit: Payer: Self-pay

## 2016-02-02 DIAGNOSIS — K5909 Other constipation: Secondary | ICD-10-CM | POA: Insufficient documentation

## 2016-02-02 DIAGNOSIS — I10 Essential (primary) hypertension: Secondary | ICD-10-CM | POA: Insufficient documentation

## 2016-02-02 DIAGNOSIS — R251 Tremor, unspecified: Secondary | ICD-10-CM | POA: Insufficient documentation

## 2016-02-02 DIAGNOSIS — E785 Hyperlipidemia, unspecified: Secondary | ICD-10-CM | POA: Insufficient documentation

## 2016-02-02 DIAGNOSIS — K5900 Constipation, unspecified: Secondary | ICD-10-CM | POA: Insufficient documentation

## 2016-02-03 ENCOUNTER — Ambulatory Visit: Payer: Self-pay | Admitting: Surgery

## 2016-02-03 ENCOUNTER — Ambulatory Visit (INDEPENDENT_AMBULATORY_CARE_PROVIDER_SITE_OTHER): Payer: Medicare Other | Admitting: Surgery

## 2016-02-03 ENCOUNTER — Encounter: Payer: Self-pay | Admitting: Surgery

## 2016-02-03 VITALS — BP 144/82 | HR 79 | Temp 98.0°F | Ht 66.0 in | Wt 201.4 lb

## 2016-02-03 DIAGNOSIS — R1033 Periumbilical pain: Secondary | ICD-10-CM | POA: Diagnosis not present
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
## 2016-02-03 NOTE — Patient Instructions (Addendum)
We have scheduled you for a CT Scan of your Abdomen and Pelvis. This will be done on 02/12/16 at 1000am. Arrive at [facility] at outpatient imaging.  You will need to pick up a prep kit at least 24 hours in advance of your Scan: You may pick this up at the [facility] department at [facility], or [employer].  Bring a list of medications with you to your appointment.  If you have an allergy to IVP dye or CT contrast and this has not been addressed, please call our office [phone] and ask to speak with a nurse at least 48 hours in advance of your CT Scan.  Nothing to eat or drink 4 hours prior to your CT Scan.   If you need to reschedule your Scan, you may do so by calling [phone].

## 2016-02-11 ENCOUNTER — Ambulatory Visit
Admission: RE | Admit: 2016-02-11 | Discharge: 2016-02-11 | Disposition: A | Discharge status: Home / Self Care | Payer: Medicare Other | Source: Ambulatory Visit | Attending: Surgery | Admitting: Surgery

## 2016-02-11 DIAGNOSIS — R1033 Periumbilical pain: Secondary | ICD-10-CM | POA: Diagnosis not present

## 2016-02-11 DIAGNOSIS — K5900 Constipation, unspecified: Secondary | ICD-10-CM | POA: Insufficient documentation

## 2016-02-11 DIAGNOSIS — Z9081 Acquired absence of spleen: Secondary | ICD-10-CM | POA: Insufficient documentation

## 2016-02-11 DIAGNOSIS — Z9049 Acquired absence of other specified parts of digestive tract: Secondary | ICD-10-CM | POA: Diagnosis not present

## 2016-02-11 DIAGNOSIS — N281 Cyst of kidney, acquired: Secondary | ICD-10-CM | POA: Diagnosis not present

## 2016-02-11 DIAGNOSIS — K439 Ventral hernia without obstruction or gangrene: Secondary | ICD-10-CM | POA: Diagnosis not present

## 2016-02-11 LAB — POCT I-STAT CREATININE: Creatinine, Ser: 1 mg/dL (ref 0.61–1.24)

## 2016-02-11 MED ORDER — IOPAMIDOL (ISOVUE-300) INJECTION 61%
100.0000 mL | Freq: Once | INTRAVENOUS | Status: AC | PRN
  Administered 2016-02-11: 100 mL via INTRAVENOUS

## 2016-02-12 ENCOUNTER — Ambulatory Visit: Payer: Medicare Other

## 2016-02-17 ENCOUNTER — Ambulatory Visit: Admission: RE | Admit: 2016-02-17 | Payer: Medicare Other | Source: Ambulatory Visit

## 2016-02-18 ENCOUNTER — Encounter: Payer: Self-pay | Admitting: Surgery

## 2016-02-18 ENCOUNTER — Ambulatory Visit (INDEPENDENT_AMBULATORY_CARE_PROVIDER_SITE_OTHER): Payer: Medicare Other | Admitting: Surgery

## 2016-02-18 ENCOUNTER — Ambulatory Visit: Payer: Self-pay | Admitting: Surgery

## 2016-02-18 VITALS — BP 121/66 | HR 60 | Temp 98.9°F | Wt 205.0 lb

## 2016-02-18 DIAGNOSIS — L98491 Non-pressure chronic ulcer of skin of other sites limited to breakdown of skin: Secondary | ICD-10-CM
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
## 2016-02-18 NOTE — Patient Instructions (Signed)
Please apply a large bandage on your abdomen. We will see you back in 3 weeks to give you your pathology results. Please give us a call if you have any questions or concerns.

## 2016-02-19 NOTE — Addendum Note (Signed)
Addended by: Adela PortsBONICHE, Yasira Engelson on: 02/19/2016 08:52 AM   Modules accepted: Orders

## 2016-02-22 ENCOUNTER — Telehealth: Payer: Self-pay

## 2016-02-22 NOTE — Telephone Encounter (Signed)
Dr. Elijah Birkara Rubinas called stating that the specimen that I had taken to [facility] Department they would not be able to do due to work load. Dr. Oneita Krasubinas recommended for us to use [facility]. I told her that the reason we had chosen [facility] was because if the specimen came out positive, then Dr. Everlene FarrierPabon would be able to present this at Tumor Board. Dr. Oneita Krasubinas assured that if there was anything positive that [facility] would let them know.  I will have to go to the [facility] Pathology Department and pick up the specimen so I could send it to [facility].

## 2016-02-24 NOTE — Addendum Note (Signed)
Addended by: Adela PortsBONICHE, Rihanna Marseille on: 02/24/2016 09:26 AM   Modules accepted: Orders

## 2016-02-24 NOTE — Progress Notes (Signed)
[facility] since [facility] lab (Dr. Oneita Krasubinas) told me to send it to them due to work load.

## 2016-03-01 ENCOUNTER — Encounter: Payer: Self-pay | Admitting: Surgery

## 2016-03-01 ENCOUNTER — Telehealth: Payer: Self-pay

## 2016-03-01 NOTE — Telephone Encounter (Signed)
Called patient back to let him know that his pathology was normal/benign. He was happy to hear. I also reminded him of his follow up appointment with Dr. Everlene FarrierPabon. Patient had no further questions.

## 2016-03-09 ENCOUNTER — Ambulatory Visit: Payer: Medicare Other | Admitting: Surgery

## 2016-03-21 ENCOUNTER — Ambulatory Visit: Payer: Medicare Other | Admitting: Surgery

## 2016-03-21 ENCOUNTER — Telehealth: Payer: Self-pay | Admitting: Surgery

## 2016-03-21 NOTE — Telephone Encounter (Signed)
No show. This appointment was confirmed. Care giver stated there was an emergency at the group home and he will call us back to reschedule.
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
## 2016-08-14 ENCOUNTER — Emergency Department
Admission: EM | Admit: 2016-08-14 | Discharge: 2016-08-14 | Disposition: A | Discharge status: Home / Self Care | Payer: Medicare Other | Source: Home / Self Care | Attending: Emergency Medicine | Admitting: Emergency Medicine

## 2016-08-14 DIAGNOSIS — F17200 Nicotine dependence, unspecified, uncomplicated: Secondary | ICD-10-CM

## 2016-08-14 DIAGNOSIS — Z7982 Long term (current) use of aspirin: Secondary | ICD-10-CM | POA: Insufficient documentation

## 2016-08-14 DIAGNOSIS — J45909 Unspecified asthma, uncomplicated: Secondary | ICD-10-CM | POA: Insufficient documentation

## 2016-08-14 DIAGNOSIS — L03211 Cellulitis of face: Secondary | ICD-10-CM | POA: Insufficient documentation

## 2016-08-14 DIAGNOSIS — Z79899 Other long term (current) drug therapy: Secondary | ICD-10-CM

## 2016-08-14 DIAGNOSIS — I10 Essential (primary) hypertension: Secondary | ICD-10-CM

## 2016-08-14 LAB — CBC WITH DIFFERENTIAL/PLATELET
BASOS PCT: 1 %
Basophils Absolute: 0.1 10*3/uL (ref 0–0.1)
EOS ABS: 0.1 10*3/uL (ref 0–0.7)
Eosinophils Relative: 1 %
HCT: 40.6 % (ref 40.0–52.0)
HEMOGLOBIN: 13.8 g/dL (ref 13.0–18.0)
Lymphocytes Relative: 20 %
Lymphs Abs: 2.3 10*3/uL (ref 1.0–3.6)
MCH: 33.2 pg (ref 26.0–34.0)
MCHC: 34.1 g/dL (ref 32.0–36.0)
MCV: 97.3 fL (ref 80.0–100.0)
Monocytes Absolute: 1.5 10*3/uL — ABNORMAL HIGH (ref 0.2–1.0)
Monocytes Relative: 13 %
NEUTROS PCT: 65 %
Neutro Abs: 7.6 10*3/uL — ABNORMAL HIGH (ref 1.4–6.5)
PLATELETS: 290 10*3/uL (ref 150–440)
RBC: 4.17 MIL/uL — AB (ref 4.40–5.90)
RDW: 13.6 % (ref 11.5–14.5)
WBC: 11.7 10*3/uL — AB (ref 3.8–10.6)

## 2016-08-14 LAB — BASIC METABOLIC PANEL
Anion gap: 6 (ref 5–15)
BUN: 17 mg/dL (ref 6–20)
CALCIUM: 9.2 mg/dL (ref 8.9–10.3)
CO2: 32 mmol/L (ref 22–32)
CREATININE: 1.11 mg/dL (ref 0.61–1.24)
Chloride: 95 mmol/L — ABNORMAL LOW (ref 101–111)
Glucose, Bld: 113 mg/dL — ABNORMAL HIGH (ref 65–99)
Potassium: 4.5 mmol/L (ref 3.5–5.1)
SODIUM: 133 mmol/L — AB (ref 135–145)

## 2016-08-14 MED ORDER — ACETAMINOPHEN 325 MG PO TABS
650.0000 mg | ORAL_TABLET | Freq: Once | ORAL | Status: AC
Start: 2016-08-14 — End: 2016-08-14
  Administered 2016-08-14: 650 mg via ORAL
  Filled 2016-08-14: qty 2

## 2016-08-14 MED ORDER — SULFAMETHOXAZOLE-TRIMETHOPRIM 800-160 MG PO TABS
1.0000 | ORAL_TABLET | Freq: Once | ORAL | Status: AC
  Administered 2016-08-14: 1 via ORAL
  Filled 2016-08-14: qty 1

## 2016-08-14 MED ORDER — SULFAMETHOXAZOLE-TRIMETHOPRIM 800-160 MG PO TABS: 1.0000 | ORAL_TABLET | Freq: Two times a day (BID) | ORAL | 0 refills | Status: DC

## 2016-08-14 NOTE — Discharge Instructions (Addendum)
From Dr. Shaune Pollack:  You were evaluated for facial swelling and are being treated for skin infection called cellulitis with antibiotic bactrim.  Given the sensitive area, he needs to be rechecked in about 24 hours. This can be done a primary care doctor's office, or here in the emergency department.  Return to the emergency room immediately for any fever, confusion altered mental status, dizziness or passing out, vomiting, worsening redness or swelling, and certainly any trouble swallowing.

## 2016-08-14 NOTE — ED Triage Notes (Addendum)
Pt to ED via pov c/o spider bite. Per pt he was outside on Friday and got bit right underneath his left nare. Pt reports swelling has gotten progressively worse and he now has a "splitting headache". Pt also reports feeling light headed, dizzy, and short of breath since Friday.

## 2016-08-14 NOTE — ED Notes (Signed)
Pt sat at 95% on RA . Pt NAD at this time. BP is soft at 92/54. RN will continue to monitor.

## 2016-08-14 NOTE — ED Provider Notes (Signed)
-----------------------------------------   4:29 PM on 08/14/2016 -----------------------------------------  Patient care was assumed from Dr. Shaune Pollack. Overall the patient continues to appear well. Workup and exam suggests mild cellulitis. Patient's white blood cell count is 11.7. Patient's blood pressure remains on the lower end currently 97/78, however in reviewing the patient's past records including ER visits and discharge summaries he appears to have a chronically low blood pressure, similar to today's readings. We will discharge with antibiotics and primary care follow-up.   Minna Antis, MD 08/14/16 1630

## 2016-08-14 NOTE — ED Notes (Signed)
Pt discharged to home.  Caregiver driving.  Discharge instructions reviewed.  Verbalized understanding.  No questions or concerns at this time.  Teach back verified.  Pt in NAD.  No items left in ED.   
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
## 2016-08-16 ENCOUNTER — Encounter: Payer: Self-pay | Admitting: *Deleted

## 2016-08-16 DIAGNOSIS — E785 Hyperlipidemia, unspecified: Secondary | ICD-10-CM | POA: Diagnosis present

## 2016-08-16 DIAGNOSIS — Z79899 Other long term (current) drug therapy: Secondary | ICD-10-CM

## 2016-08-16 DIAGNOSIS — W57XXXA Bitten or stung by nonvenomous insect and other nonvenomous arthropods, initial encounter: Secondary | ICD-10-CM | POA: Diagnosis present

## 2016-08-16 DIAGNOSIS — Z7982 Long term (current) use of aspirin: Secondary | ICD-10-CM

## 2016-08-16 DIAGNOSIS — F319 Bipolar disorder, unspecified: Secondary | ICD-10-CM | POA: Diagnosis present

## 2016-08-16 DIAGNOSIS — E871 Hypo-osmolality and hyponatremia: Secondary | ICD-10-CM | POA: Diagnosis present

## 2016-08-16 DIAGNOSIS — Z8673 Personal history of transient ischemic attack (TIA), and cerebral infarction without residual deficits: Secondary | ICD-10-CM

## 2016-08-16 DIAGNOSIS — I10 Essential (primary) hypertension: Secondary | ICD-10-CM | POA: Diagnosis present

## 2016-08-16 DIAGNOSIS — J441 Chronic obstructive pulmonary disease with (acute) exacerbation: Secondary | ICD-10-CM | POA: Diagnosis present

## 2016-08-16 DIAGNOSIS — F0390 Unspecified dementia without behavioral disturbance: Secondary | ICD-10-CM | POA: Diagnosis present

## 2016-08-16 DIAGNOSIS — L03211 Cellulitis of face: Principal | ICD-10-CM | POA: Diagnosis present

## 2016-08-16 DIAGNOSIS — K130 Diseases of lips: Secondary | ICD-10-CM | POA: Diagnosis present

## 2016-08-16 DIAGNOSIS — G709 Myoneural disorder, unspecified: Secondary | ICD-10-CM | POA: Diagnosis present

## 2016-08-16 DIAGNOSIS — B9562 Methicillin resistant Staphylococcus aureus infection as the cause of diseases classified elsewhere: Secondary | ICD-10-CM | POA: Diagnosis present

## 2016-08-16 DIAGNOSIS — Z23 Encounter for immunization: Secondary | ICD-10-CM

## 2016-08-16 DIAGNOSIS — F17210 Nicotine dependence, cigarettes, uncomplicated: Secondary | ICD-10-CM | POA: Diagnosis present

## 2016-08-16 NOTE — ED Triage Notes (Signed)
Pt seen here Sunday for "spider bite"; now with worsening symptoms; pt with crusted area just beneath left nostril

## 2016-08-16 NOTE — ED Triage Notes (Signed)
PT is from [facility] home reports he saw a spider on a web (Friday) and it landed just below his left nare, he smashed it with his hand on his face. He has crusted area to just below the left nare with some swelling. He was seen here and given abx, taking them as directed. He says that the area goes down and then swells more, no change in his pain

## 2016-08-17 ENCOUNTER — Emergency Department: Payer: Medicare Other

## 2016-08-17 ENCOUNTER — Inpatient Hospital Stay
Admission: EM | Admit: 2016-08-17 | Discharge: 2016-08-19 | DRG: 580 | Disposition: A | Discharge status: Home Health | Payer: Medicare Other | Attending: Internal Medicine | Admitting: Internal Medicine

## 2016-08-17 ENCOUNTER — Encounter: Payer: Self-pay | Admitting: Radiology

## 2016-08-17 DIAGNOSIS — Z7982 Long term (current) use of aspirin: Secondary | ICD-10-CM | POA: Diagnosis not present

## 2016-08-17 DIAGNOSIS — J441 Chronic obstructive pulmonary disease with (acute) exacerbation: Secondary | ICD-10-CM | POA: Diagnosis present

## 2016-08-17 DIAGNOSIS — F0390 Unspecified dementia without behavioral disturbance: Secondary | ICD-10-CM | POA: Diagnosis present

## 2016-08-17 DIAGNOSIS — F319 Bipolar disorder, unspecified: Secondary | ICD-10-CM | POA: Diagnosis present

## 2016-08-17 DIAGNOSIS — K130 Diseases of lips: Secondary | ICD-10-CM | POA: Diagnosis present

## 2016-08-17 DIAGNOSIS — G709 Myoneural disorder, unspecified: Secondary | ICD-10-CM | POA: Diagnosis present

## 2016-08-17 DIAGNOSIS — E785 Hyperlipidemia, unspecified: Secondary | ICD-10-CM | POA: Diagnosis present

## 2016-08-17 DIAGNOSIS — F17210 Nicotine dependence, cigarettes, uncomplicated: Secondary | ICD-10-CM | POA: Diagnosis present

## 2016-08-17 DIAGNOSIS — E871 Hypo-osmolality and hyponatremia: Secondary | ICD-10-CM | POA: Diagnosis present

## 2016-08-17 DIAGNOSIS — L03211 Cellulitis of face: Secondary | ICD-10-CM

## 2016-08-17 DIAGNOSIS — I10 Essential (primary) hypertension: Secondary | ICD-10-CM | POA: Diagnosis present

## 2016-08-17 DIAGNOSIS — Z79899 Other long term (current) drug therapy: Secondary | ICD-10-CM | POA: Diagnosis not present

## 2016-08-17 DIAGNOSIS — L0390 Cellulitis, unspecified: Secondary | ICD-10-CM | POA: Diagnosis present

## 2016-08-17 DIAGNOSIS — W57XXXA Bitten or stung by nonvenomous insect and other nonvenomous arthropods, initial encounter: Secondary | ICD-10-CM | POA: Diagnosis not present

## 2016-08-17 DIAGNOSIS — B9562 Methicillin resistant Staphylococcus aureus infection as the cause of diseases classified elsewhere: Secondary | ICD-10-CM | POA: Diagnosis present

## 2016-08-17 DIAGNOSIS — L0291 Cutaneous abscess, unspecified: Secondary | ICD-10-CM

## 2016-08-17 DIAGNOSIS — Z8673 Personal history of transient ischemic attack (TIA), and cerebral infarction without residual deficits: Secondary | ICD-10-CM | POA: Diagnosis not present

## 2016-08-17 DIAGNOSIS — Z23 Encounter for immunization: Secondary | ICD-10-CM | POA: Diagnosis present

## 2016-08-17 LAB — BASIC METABOLIC PANEL
Anion gap: 8 (ref 5–15)
BUN: 19 mg/dL (ref 6–20)
CALCIUM: 9.2 mg/dL (ref 8.9–10.3)
CO2: 26 mmol/L (ref 22–32)
Chloride: 96 mmol/L — ABNORMAL LOW (ref 101–111)
Creatinine, Ser: 0.98 mg/dL (ref 0.61–1.24)
GLUCOSE: 114 mg/dL — AB (ref 65–99)
Potassium: 4.4 mmol/L (ref 3.5–5.1)
Sodium: 130 mmol/L — ABNORMAL LOW (ref 135–145)

## 2016-08-17 LAB — CBC WITH DIFFERENTIAL/PLATELET
BASOS PCT: 1 %
Basophils Absolute: 0.1 10*3/uL (ref 0–0.1)
EOS PCT: 3 %
Eosinophils Absolute: 0.3 10*3/uL (ref 0–0.7)
HEMATOCRIT: 39.9 % — AB (ref 40.0–52.0)
Hemoglobin: 13.5 g/dL (ref 13.0–18.0)
Lymphocytes Relative: 16 %
Lymphs Abs: 1.9 10*3/uL (ref 1.0–3.6)
MCH: 32.8 pg (ref 26.0–34.0)
MCHC: 33.8 g/dL (ref 32.0–36.0)
MCV: 97 fL (ref 80.0–100.0)
MONO ABS: 1.4 10*3/uL — AB (ref 0.2–1.0)
MONOS PCT: 12 %
NEUTROS ABS: 7.9 10*3/uL — AB (ref 1.4–6.5)
NEUTROS PCT: 68 %
PLATELETS: 305 10*3/uL (ref 150–440)
RBC: 4.12 MIL/uL — ABNORMAL LOW (ref 4.40–5.90)
RDW: 13.7 % (ref 11.5–14.5)
WBC: 11.7 10*3/uL — AB (ref 3.8–10.6)

## 2016-08-17 LAB — MRSA PCR SCREENING: MRSA by PCR: POSITIVE — AB

## 2016-08-17 MED ORDER — LISINOPRIL 10 MG PO TABS
10.0000 mg | ORAL_TABLET | Freq: Every day | ORAL | Status: DC
  Administered 2016-08-18 – 2016-08-19 (×2): 10 mg via ORAL
  Filled 2016-08-17 (×3): qty 1

## 2016-08-17 MED ORDER — SODIUM CHLORIDE 0.9 % IV SOLN
3.0000 g | Freq: Once | INTRAVENOUS | Status: AC
  Administered 2016-08-17: 3 g via INTRAVENOUS
  Filled 2016-08-17: qty 3

## 2016-08-17 MED ORDER — ACETAMINOPHEN 650 MG RE SUPP: 650.0000 mg | Freq: Four times a day (QID) | RECTAL | Status: DC | PRN

## 2016-08-17 MED ORDER — CHLORHEXIDINE GLUCONATE CLOTH 2 % EX PADS
6.0000 | MEDICATED_PAD | Freq: Every day | CUTANEOUS | Status: DC
  Administered 2016-08-18 – 2016-08-19 (×2): 6 via TOPICAL

## 2016-08-17 MED ORDER — LITHIUM CARBONATE 300 MG PO CAPS
300.0000 mg | ORAL_CAPSULE | Freq: Two times a day (BID) | ORAL | Status: DC
  Administered 2016-08-17 – 2016-08-19 (×4): 300 mg via ORAL
  Filled 2016-08-17 (×4): qty 1

## 2016-08-17 MED ORDER — ACETAMINOPHEN 325 MG PO TABS
650.0000 mg | ORAL_TABLET | Freq: Four times a day (QID) | ORAL | Status: DC | PRN
  Administered 2016-08-17 – 2016-08-19 (×3): 650 mg via ORAL
  Filled 2016-08-17 (×3): qty 2

## 2016-08-17 MED ORDER — MUPIROCIN 2 % EX OINT
1.0000 "application " | TOPICAL_OINTMENT | Freq: Two times a day (BID) | CUTANEOUS | Status: DC
  Administered 2016-08-17 – 2016-08-19 (×5): 1 via NASAL
  Filled 2016-08-17: qty 22

## 2016-08-17 MED ORDER — LUBIPROSTONE 8 MCG PO CAPS
8.0000 ug | ORAL_CAPSULE | Freq: Two times a day (BID) | ORAL | Status: DC
  Administered 2016-08-17 – 2016-08-19 (×4): 8 ug via ORAL
  Filled 2016-08-17 (×5): qty 1

## 2016-08-17 MED ORDER — ASPIRIN 81 MG PO CHEW
81.0000 mg | CHEWABLE_TABLET | Freq: Every day | ORAL | Status: DC
  Administered 2016-08-17 – 2016-08-19 (×3): 81 mg via ORAL
  Filled 2016-08-17 (×3): qty 1

## 2016-08-17 MED ORDER — SODIUM CHLORIDE 0.9 % IV SOLN
INTRAVENOUS | Status: DC
  Administered 2016-08-17 (×2): via INTRAVENOUS

## 2016-08-17 MED ORDER — CLINDAMYCIN PHOSPHATE 600 MG/50ML IV SOLN
600.0000 mg | Freq: Three times a day (TID) | INTRAVENOUS | Status: DC
  Administered 2016-08-17 – 2016-08-18 (×3): 600 mg via INTRAVENOUS
  Filled 2016-08-17 (×5): qty 50

## 2016-08-17 MED ORDER — IOPAMIDOL (ISOVUE-300) INJECTION 61%
75.0000 mL | Freq: Once | INTRAVENOUS | Status: AC | PRN
  Administered 2016-08-17: 75 mL via INTRAVENOUS

## 2016-08-17 MED ORDER — ONDANSETRON HCL 4 MG/2ML IJ SOLN: 4.0000 mg | Freq: Four times a day (QID) | INTRAMUSCULAR | Status: DC | PRN

## 2016-08-17 MED ORDER — CARBAMAZEPINE 200 MG PO TABS
200.0000 mg | ORAL_TABLET | Freq: Two times a day (BID) | ORAL | Status: DC
  Administered 2016-08-17 – 2016-08-19 (×5): 200 mg via ORAL
  Filled 2016-08-17 (×5): qty 1

## 2016-08-17 MED ORDER — MORPHINE SULFATE (PF) 2 MG/ML IV SOLN
2.0000 mg | INTRAVENOUS | Status: DC | PRN
  Administered 2016-08-17: 2 mg via INTRAVENOUS
  Filled 2016-08-17: qty 1

## 2016-08-17 MED ORDER — POLYETHYLENE GLYCOL 3350 17 G PO PACK
17.0000 g | PACK | Freq: Two times a day (BID) | ORAL | Status: DC
  Administered 2016-08-17 – 2016-08-19 (×4): 17 g via ORAL
  Filled 2016-08-17 (×5): qty 1

## 2016-08-17 MED ORDER — BENZTROPINE MESYLATE 0.5 MG PO TABS
0.5000 mg | ORAL_TABLET | Freq: Three times a day (TID) | ORAL | Status: DC
  Administered 2016-08-17 – 2016-08-19 (×7): 0.5 mg via ORAL
  Filled 2016-08-17 (×8): qty 1

## 2016-08-17 MED ORDER — MUPIROCIN CALCIUM 2 % EX CREA
TOPICAL_CREAM | Freq: Every day | CUTANEOUS | Status: DC
Start: 2016-08-17 — End: 2016-08-19
  Administered 2016-08-17 – 2016-08-19 (×3): via TOPICAL
  Filled 2016-08-17: qty 15

## 2016-08-17 MED ORDER — PNEUMOCOCCAL VAC POLYVALENT 25 MCG/0.5ML IJ INJ
0.5000 mL | INJECTION | INTRAMUSCULAR | Status: AC
  Administered 2016-08-19: 0.5 mL via INTRAMUSCULAR
  Filled 2016-08-17: qty 0.5

## 2016-08-17 MED ORDER — ONDANSETRON HCL 4 MG PO TABS: 4.0000 mg | ORAL_TABLET | Freq: Four times a day (QID) | ORAL | Status: DC | PRN

## 2016-08-17 MED ORDER — VANCOMYCIN HCL IN DEXTROSE 1-5 GM/200ML-% IV SOLN
INTRAVENOUS | Status: AC
  Filled 2016-08-17: qty 200

## 2016-08-17 MED ORDER — ATORVASTATIN CALCIUM 20 MG PO TABS
20.0000 mg | ORAL_TABLET | Freq: Every day | ORAL | Status: DC
  Administered 2016-08-17 – 2016-08-19 (×3): 20 mg via ORAL
  Filled 2016-08-17 (×3): qty 1

## 2016-08-17 MED ORDER — SODIUM CHLORIDE 0.9 % IV BOLUS (SEPSIS)
1000.0000 mL | Freq: Once | INTRAVENOUS | Status: AC
  Administered 2016-08-17: 1000 mL via INTRAVENOUS

## 2016-08-17 MED ORDER — PROPRANOLOL HCL 20 MG PO TABS
40.0000 mg | ORAL_TABLET | Freq: Two times a day (BID) | ORAL | Status: DC
  Administered 2016-08-17 – 2016-08-19 (×3): 40 mg via ORAL
  Filled 2016-08-17 (×5): qty 2

## 2016-08-17 MED ORDER — VANCOMYCIN HCL IN DEXTROSE 1-5 GM/200ML-% IV SOLN
1000.0000 mg | Freq: Once | INTRAVENOUS | Status: AC
  Administered 2016-08-17: 1000 mg via INTRAVENOUS

## 2016-08-17 MED ORDER — LACTULOSE 10 GM/15ML PO SOLN: 20.0000 g | Freq: Every day | ORAL | Status: DC | PRN

## 2016-08-17 MED ORDER — RISPERIDONE 1 MG PO TABS
1.0000 mg | ORAL_TABLET | Freq: Three times a day (TID) | ORAL | Status: DC
  Administered 2016-08-17 – 2016-08-19 (×5): 1 mg via ORAL
  Filled 2016-08-17 (×6): qty 1

## 2016-08-17 MED ORDER — VANCOMYCIN HCL IN DEXTROSE 1-5 GM/200ML-% IV SOLN
1000.0000 mg | Freq: Two times a day (BID) | INTRAVENOUS | Status: DC
  Administered 2016-08-17 – 2016-08-19 (×4): 1000 mg via INTRAVENOUS
  Filled 2016-08-17 (×6): qty 200

## 2016-08-17 MED ORDER — ENOXAPARIN SODIUM 40 MG/0.4ML ~~LOC~~ SOLN
40.0000 mg | SUBCUTANEOUS | Status: DC
  Administered 2016-08-17 – 2016-08-19 (×3): 40 mg via SUBCUTANEOUS
  Filled 2016-08-17 (×3): qty 0.4

## 2016-08-17 MED ORDER — CLINDAMYCIN PHOSPHATE 600 MG/50ML IV SOLN
600.0000 mg | Freq: Once | INTRAVENOUS | Status: AC
Start: 2016-08-17 — End: 2016-08-17
  Administered 2016-08-17: 600 mg via INTRAVENOUS
  Filled 2016-08-17: qty 50

## 2016-08-17 NOTE — Consult Note (Signed)
WOC Nurse wound consult note Reason for Consult: Nonhealing open lesion at distal end of surgical scar below umbilicus.  Wound type: Nonhealing chronic wound Pressure Injury POA: N/A Measurement: 1.5 cm x 1 cm x 0.2 cm  Wound ZOX:WRUE pink nongranulating.   Drainage (amount, consistency, odor) Scant serosanguinous  No odor.  Periwound:Scabbed periwound Dressing procedure/placement/frequency:Cleanse open abdominal wound with NS and pat gently dry.  Apply Bactroban to wound bed.  Cover with dry dressing and tape. Change daily.  Will not follow at this time.  Please re-consult if needed.  Maple Hudson RN BSN CWON Pager [phone]
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
## 2016-08-17 NOTE — Progress Notes (Addendum)
[facility] [place] [facility]   PATIENT NAME: Cameron Ford    MR#:  [id number]  DATE OF BIRTH:  May 22, 1945  SUBJECTIVE:  CHIEF COMPLAINT:   Chief Complaint  Patient presents with  . Insect Bite  He is hurting over his lip, requesting pain medication REVIEW OF SYSTEMS:  Review of Systems  Constitutional: Negative for chills, fever and weight loss.  HENT: Negative for nosebleeds and sore throat.   Eyes: Negative for blurred vision.  Respiratory: Negative for cough, shortness of breath and wheezing.   Cardiovascular: Negative for chest pain, orthopnea, leg swelling and PND.  Gastrointestinal: Negative for abdominal pain, constipation, diarrhea, heartburn, nausea and vomiting.  Genitourinary: Negative for dysuria and urgency.  Musculoskeletal: Negative for back pain.  Skin: Positive for rash.  Neurological: Negative for dizziness, speech change, focal weakness and headaches.  Endo/Heme/Allergies: Does not bruise/bleed easily.  Psychiatric/Behavioral: Negative for depression.    DRUG ALLERGIES:  No Known Allergies VITALS:  Blood pressure (!) 123/54, pulse 88, temperature 98.7 F (37.1 C), temperature source Oral, resp. rate 18, height  (1.727 m), weight 88.3 kg (194 lb 9.6 oz), SpO2 90 %. PHYSICAL EXAMINATION:  Physical Exam  Constitutional: He is oriented to person, place, and time and well-developed, well-nourished, and in no distress.  HENT:  Head: Normocephalic and atraumatic.  swollen upper lip with redness and crusting, noticed frank pus coming out of the lesion  Eyes: Conjunctivae and EOM are normal. Pupils are equal, round, and reactive to light.  Neck: Normal range of motion. Neck supple. No tracheal deviation present. No thyromegaly present.  Cardiovascular: Normal rate, regular rhythm and normal heart sounds.   Pulmonary/Chest: Effort normal and breath sounds normal. No respiratory distress. He has no wheezes. He exhibits no tenderness.   Abdominal: Soft. Bowel sounds are normal. He exhibits no distension. There is no tenderness.  Musculoskeletal: Normal range of motion.  Neurological: He is alert and oriented to person, place, and time. No cranial nerve deficit.  Skin: Skin is warm and dry. No rash noted.  Psychiatric: Mood and affect normal.   LABORATORY PANEL:  Male CBC  Recent Labs Lab 08/17/16 0229  WBC 11.7*  HGB 13.5  HCT 39.9*  PLT 305   ------------------------------------------------------------------------------------------------------------------ Chemistries   Recent Labs Lab 08/17/16 0229  NA 130*  K 4.4  CL 96*  CO2 26  GLUCOSE 114*  BUN 19  CREATININE 0.98  CALCIUM 9.2   RADIOLOGY:  Ct Maxillofacial W Contrast  Result Date: 08/17/2016 CLINICAL DATA:  Initial evaluation for acute infection and swelling at the upper lip. EXAM: CT MAXILLOFACIAL WITH CONTRAST TECHNIQUE: Multidetector CT imaging of the maxillofacial structures was performed. Multiplanar CT image reconstructions were also generated. A small metallic BB was placed on the right temple in order to reliably differentiate right from left. COMPARISON:  None. FINDINGS: Osseous: No acute osseous abnormality identified within the face. No acute fracture. Linear lucency extending through the bilateral nasal bones favored to be chronic. Mild S shape bowing of the nasal septum noted. Patient is largely edentulous, with no remaining maxillary teeth. Scattered dental caries present about the remaining few mandibular teeth. Orbits: Globes and orbital soft tissues within normal limits. Bony orbits intact. Sinuses: Paranasal sinuses are clear. Mastoids and middle ear cavities are clear. Soft tissues: Soft tissue swelling present at the central aspect of the upper let, concerning for possible cellulitis given provided history. There is superimposed irregular multi loculated hypodense collection, concerning for abscess. This  measures approximately 1.9 x  3.7 x 3.0 cm. Surrounding rim enhancement. Remainder the visualized soft tissues of the face within normal limits. Few mildly prominent level 1 B nodes noted, which may be reactive. Limited intracranial: Unremarkable. IMPRESSION: Soft tissue swelling at the upper lip, concerning for infection/cellulitis given provided history. Superimposed irregular 1.9 x 3.7 x 3.0 cm multiloculated collection concerning for abscess. Electronically Signed   By: Rise Mu M.D.   On: 08/17/2016 04:26   ASSESSMENT AND PLAN:  71 year old male patient with history of bipolar disorder, dementia, hyperlipidemia, hypertension, CVA  , Admitted with swelling in the upper lip and redness and tenderness.  He failed outpatient oral Bactrim course for 3 days as his condition continued to worsen.  He is being admitted at this time  * Upper lip abscess: Failed outpatient Bactrim therapy - His MRSA screen is positive - Continue vancomycin and clindamycin for now - ENT consultation  * Cellulitis of the face: Continue above antibiotics  * Hyponatremia: Likely due to dehydration - Monitor with IV hydration  * Hypertension: Continue propranolol and lisinopril  * Hyperlipidemia: Continue Lipitor      All the records are reviewed and case discussed with Care Management/Social Worker. Management plans discussed with the patient, nursing and they are in agreement.  CODE STATUS: Full Code  TOTAL TIME TAKING CARE OF THIS PATIENT: 35 minutes.   More than 50% of the time was spent in counseling/coordination of care: YES  POSSIBLE D/C IN 1-2 DAYS, DEPENDING ON CLINICAL CONDITION.  And ENT evaluation   Delfino Lovett M.D on 08/17/2016 at 4:53 PM  Between 7am to 6pm - Pager - [phone]  After 6pm go to [email or web address] - Social research officer, government  [facility]  Office  [phone]  CC: Primary care physician; Kaleen Mask, MD  Note: This dictation was prepared with Dragon dictation  along with smaller phrase technology. Any transcriptional errors that result from this process are unintentional.

## 2016-08-17 NOTE — ED Notes (Signed)
Pt sitting in lobby with no distress noted, no c/o voiced; pt updated on wait time--voices understanding; vs retaken
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
## 2016-08-17 NOTE — Progress Notes (Signed)
Pharmacy Antibiotic Note  Cameron Ford is a 71 y.o. male admitted on 08/17/2016 with lip abscess.  Pharmacy has been consulted for vancomycin dosing.  Plan: DW 75kg  Vd 53L kei 0.064 hr-1  t1/2 11 hours Vancomycin 1 gram q 12 hours ordered with stacked dosing. Level before 5th dose. Goal trough 15-20.    Height:  (167.6 cm) Weight: 200 lb (90.7 kg) IBW/kg (Calculated) : 63.8  Temp (24hrs), Avg:99.1 F (37.3 C), Min:98.8 F (37.1 C), Max:99.4 F (37.4 C)   Recent Labs Lab 08/14/16 1521 08/17/16 0229  WBC 11.7* 11.7*  CREATININE 1.11 0.98    Estimated Creatinine Clearance: 71.9 mL/min (by C-G formula based on SCr of 0.98 mg/dL).    No Known Allergies  Antimicrobials this admission: Unasyn x1, vancomycin, clindamycin 4/18 >>    >>   Dose adjustments this admission:   Microbiology results: 4/18 BCx: pending    Thank you for allowing pharmacy to be a part of this patient's care.  Miri Jose S 08/17/2016 5:54 AM

## 2016-08-17 NOTE — Plan of Care (Signed)
Problem: Bowel/Gastric: Goal: Will not experience complications related to bowel motility Outcome: Not Progressing Laxative given
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
## 2016-08-17 NOTE — Consult Note (Signed)
08/17/2016  5:05 PM    Cameron Ford  [id number]   Pre-Op Diagnosis:  Upper lip abscess  Post-op Diagnosis: Upper lip abscess  Procedure:   Incision and drainage of upper lip abscess  Surgeon:  Geanie Logan S  Anesthesia:  Local anesthesia, 1% lidocaine 1 100,000  EBL:  5 cc  Complications:  None  Findings: Large multiloculated upper lip abscess with grossly purulent secretions  Procedure: The procedure was discussed with the patient indicating the need for incision and drainage for resolution of this large upper lip abscess. 1% lidocaine with epinephrine 1 100,000 was injected into the skin surrounding the small draining area in the upper lip. A 15 blade was used to make a vertical incision from the draining area to open the area further and allow better egress of purulence. Grossly purulent material was drained and expressed from the wound, obtaining a culture from the area which was sent, and probing the wound with a cotton swab to break up loculations and facilitate adequate drainage. Wound was irrigated with saline. Iodoform gauze was then placed into the wound to facilitate further drainage. A gauze dressing was applied. There was a moderate agree of oozing from the wound which was markedly inflamed. The patient tolerated the procedure reasonably well although despite the infiltration of local anesthesia this was certainly an uncomfortable procedure because of the degree of inflammation in size of the abscess.   Cameron Ford 08/17/2016 5:05 PM

## 2016-08-18 LAB — CBC
HEMATOCRIT: 37.4 % — AB (ref 40.0–52.0)
Hemoglobin: 12.8 g/dL — ABNORMAL LOW (ref 13.0–18.0)
MCH: 33.3 pg (ref 26.0–34.0)
MCHC: 34.3 g/dL (ref 32.0–36.0)
MCV: 97 fL (ref 80.0–100.0)
Platelets: 291 10*3/uL (ref 150–440)
RBC: 3.86 MIL/uL — AB (ref 4.40–5.90)
RDW: 13.5 % (ref 11.5–14.5)
WBC: 7.9 10*3/uL (ref 3.8–10.6)

## 2016-08-18 LAB — BASIC METABOLIC PANEL
ANION GAP: 5 (ref 5–15)
BUN: 12 mg/dL (ref 6–20)
CO2: 28 mmol/L (ref 22–32)
Calcium: 8.7 mg/dL — ABNORMAL LOW (ref 8.9–10.3)
Chloride: 100 mmol/L — ABNORMAL LOW (ref 101–111)
Creatinine, Ser: 0.82 mg/dL (ref 0.61–1.24)
GFR calc Af Amer: >60 mL/min (ref >60)
GLUCOSE: 103 mg/dL — AB (ref 65–99)
POTASSIUM: 4.3 mmol/L (ref 3.5–5.1)
Sodium: 133 mmol/L — ABNORMAL LOW (ref 135–145)

## 2016-08-18 MED ORDER — GENTAMICIN SULFATE 0.1 % EX OINT
TOPICAL_OINTMENT | Freq: Three times a day (TID) | CUTANEOUS | Status: DC
  Administered 2016-08-18 – 2016-08-19 (×2): via TOPICAL
  Filled 2016-08-18: qty 15

## 2016-08-18 MED ORDER — IPRATROPIUM-ALBUTEROL 0.5-2.5 (3) MG/3ML IN SOLN
3.0000 mL | Freq: Four times a day (QID) | RESPIRATORY_TRACT | Status: DC
  Administered 2016-08-18 – 2016-08-19 (×5): 3 mL via RESPIRATORY_TRACT
  Filled 2016-08-18 (×5): qty 3

## 2016-08-18 MED ORDER — BUDESONIDE 0.5 MG/2ML IN SUSP
0.5000 mg | Freq: Two times a day (BID) | RESPIRATORY_TRACT | Status: DC
  Administered 2016-08-18 – 2016-08-19 (×3): 0.5 mg via RESPIRATORY_TRACT
  Filled 2016-08-18 (×3): qty 2

## 2016-08-18 MED ORDER — PREDNISONE 10 MG PO TABS
10.0000 mg | ORAL_TABLET | Freq: Every day | ORAL | Status: DC
  Administered 2016-08-18 – 2016-08-19 (×2): 10 mg via ORAL
  Filled 2016-08-18 (×2): qty 1

## 2016-08-18 NOTE — Plan of Care (Signed)
Problem: Pain Managment: Goal: General experience of comfort will improve Outcome: Progressing Tylenol  orally given for headache. Improvement noted.   Problem: Skin Integrity: Goal: Risk for impaired skin integrity will decrease Outcome: Progressing Dressing change to upper lip and abdominal  Wounds. Tolerated well.

## 2016-08-18 NOTE — Progress Notes (Signed)
Patient ID: Cameron Ford, male   DOB: 10/25/1945, 71 y.o.   MRN: [id number]  [facility] PROGRESS NOTE  Cameron Ford WUJ:[id number] DOB: December 21, 1945 DOA: 08/17/2016 PCP: Kaleen Mask, MD  HPI/Subjective: Patient admitted with lip swelling. It feels better today as per the patient. But any time somebody messes with it it hurts. He is having a low cough and wheeze  Objective: Vitals:   08/18/16 1020 08/18/16 1434  BP: 131/67 (!) 110/55  Pulse: 77 (!) 57  Resp: 20 20  Temp: 99.1 F (37.3 C) 98.6 F (37 C)    Filed Weights   08/16/16 2226 08/17/16 0933  Weight: 90.7 kg (200 lb) 88.3 kg (194 lb 9.6 oz)    ROS: Review of Systems  Constitutional: Negative for chills and fever.  Eyes: Negative for blurred vision.  Respiratory: Positive for cough and wheezing. Negative for shortness of breath.   Cardiovascular: Negative for chest pain.  Gastrointestinal: Negative for abdominal pain, constipation, diarrhea, nausea and vomiting.  Genitourinary: Negative for dysuria.  Musculoskeletal: Negative for joint pain.  Neurological: Negative for dizziness and headaches.   Exam: Physical Exam  HENT:  Nose: No mucosal edema.  Mouth/Throat: No oropharyngeal exudate or posterior oropharyngeal edema.  Eyes: Conjunctivae, EOM and lids are normal. Pupils are equal, round, and reactive to light.  Neck: No JVD present. Carotid bruit is not present. No edema present. No thyroid mass and no thyromegaly present.  Cardiovascular: S1 normal and S2 normal.  Exam reveals no gallop.   No murmur heard. Pulses:      Dorsalis pedis pulses are 2+ on the right side, and 2+ on the left side.  Respiratory: No respiratory distress. He has decreased breath sounds in the right middle field, the right lower field, the left middle field and the left lower field. He has wheezes in the right middle field, the right lower field and the left lower field. He has no rhonchi. He has no rales.  GI: Soft. Bowel  sounds are normal. There is no tenderness.  Musculoskeletal:       Right ankle: He exhibits no swelling.       Left ankle: He exhibits no swelling.  Lymphadenopathy:    He has no cervical adenopathy.  Neurological: He is alert. No cranial nerve deficit.  Skin: Skin is warm. No rash noted. Nails show no clubbing.  Lip swollen and packing placed by ENT  Psychiatric: He has a normal mood and affect.      Data Reviewed: Basic Metabolic Panel:  Recent Labs Lab 08/14/16 1521 08/17/16 0229 08/18/16 0536  NA 133* 130* 133*  K 4.5 4.4 4.3  CL 95* 96* 100*  CO2 32 26 28  GLUCOSE 113* 114* 103*  BUN CREATININE 1.11 0.98 0.82  CALCIUM 9.2 9.2 8.7*    CBC:  Recent Labs Lab 08/14/16 1521 08/17/16 0229 08/18/16 0536  WBC 11.7* 11.7* 7.9  NEUTROABS 7.6* 7.9*  --   HGB 13.8 13.5 12.8*  HCT 40.6 39.9* 37.4*  MCV 97.3 97.0 97.0  PLT 290 305 291     Recent Results (from the past 240 hour(s))  Culture, blood (routine x 2)     Status: None (Preliminary result)   Collection Time: 08/17/16  2:29 AM  Result Value Ref Range Status   Specimen Description BLOOD LEFT ASSIST CONTROL  Final   Special Requests   Final    BOTTLES DRAWN AEROBIC AND ANAEROBIC Blood Culture adequate volume  Culture NO GROWTH 1 DAY  Final   Report Status PENDING  Incomplete  Culture, blood (routine x 2)     Status: None (Preliminary result)   Collection Time: 08/17/16  2:29 AM  Result Value Ref Range Status   Specimen Description BLOOD LEFT HAND  Final   Special Requests   Final    BOTTLES DRAWN AEROBIC AND ANAEROBIC Blood Culture adequate volume   Culture NO GROWTH 1 DAY  Final   Report Status PENDING  Incomplete  MRSA PCR Screening     Status: Abnormal   Collection Time: 08/17/16 10:18 AM  Result Value Ref Range Status   MRSA by PCR POSITIVE (A) NEGATIVE Final    Comment:        The GeneXpert MRSA Assay (FDA approved for NASAL specimens only), is one component of a comprehensive MRSA  colonization surveillance program. It is not intended to diagnose MRSA infection nor to guide or monitor treatment for MRSA infections. RESULT CALLED TO, READ BACK BY AND VERIFIED WITH:  CHRIS BENNETT AT 1147 08/17/16 SDR   Aerobic Culture (superficial specimen)     Status: None (Preliminary result)   Collection Time: 08/17/16  4:57 PM  Result Value Ref Range Status   Specimen Description MOUTH upper lip  Final   Special Requests NONE  Final   Gram Stain   Final    ABUNDANT WBC PRESENT, PREDOMINANTLY PMN RARE GRAM POSITIVE COCCI IN CLUSTERS IN PAIRS Performed at [facility], [address] [place], [place] [address]    Culture PENDING  Incomplete   Report Status PENDING  Incomplete     Studies: Ct Maxillofacial W Contrast  Result Date: 08/17/2016 CLINICAL DATA:  Initial evaluation for acute infection and swelling at the upper lip. EXAM: CT MAXILLOFACIAL WITH CONTRAST TECHNIQUE: Multidetector CT imaging of the maxillofacial structures was performed. Multiplanar CT image reconstructions were also generated. A small metallic BB was placed on the right temple in order to reliably differentiate right from left. COMPARISON:  None. FINDINGS: Osseous: No acute osseous abnormality identified within the face. No acute fracture. Linear lucency extending through the bilateral nasal bones favored to be chronic. Mild S shape bowing of the nasal septum noted. Patient is largely edentulous, with no remaining maxillary teeth. Scattered dental caries present about the remaining few mandibular teeth. Orbits: Globes and orbital soft tissues within normal limits. Bony orbits intact. Sinuses: Paranasal sinuses are clear. Mastoids and middle ear cavities are clear. Soft tissues: Soft tissue swelling present at the central aspect of the upper let, concerning for possible cellulitis given provided history. There is superimposed irregular multi loculated hypodense collection, concerning for abscess. This  measures approximately 1.9 x 3.7 x 3.0 cm. Surrounding rim enhancement. Remainder the visualized soft tissues of the face within normal limits. Few mildly prominent level 1 B nodes noted, which may be reactive. Limited intracranial: Unremarkable. IMPRESSION: Soft tissue swelling at the upper lip, concerning for infection/cellulitis given provided history. Superimposed irregular 1.9 x 3.7 x 3.0 cm multiloculated collection concerning for abscess. Electronically Signed   By: Rise Mu M.D.   On: 08/17/2016 04:26    Scheduled Meds: . aspirin  81 mg Oral Daily  . atorvastatin  20 mg Oral Daily  . benztropine  0.5 mg Oral Q8H  . budesonide (PULMICORT) nebulizer solution  0.5 mg Nebulization BID  . carbamazepine  200 mg Oral BID  . Chlorhexidine Gluconate Cloth  6 each Topical Q0600  . enoxaparin (LOVENOX) injection  40  mg Subcutaneous Q24H  . gentamicin ointment   Topical TID  . ipratropium-albuterol  3 mL Nebulization Q6H  . lisinopril  10 mg Oral Daily  . lithium carbonate  300 mg Oral BID WC  . lubiprostone  8 mcg Oral BID WC  . mupirocin cream   Topical Daily  . mupirocin ointment  1 application Nasal BID  . pneumococcal 23 valent vaccine  0.5 mL Intramuscular Tomorrow-1000  . polyethylene glycol  17 g Oral BID  . predniSONE  10 mg Oral Q breakfast  . propranolol  40 mg Oral BID  . risperiDONE  1 mg Oral Q8H   Continuous Infusions: . vancomycin Stopped (08/18/16 1500)    Assessment/Plan:  1. Upper lip abscess and cellulitis which failed outpatient Bactrim therapy. Continue vancomycin while here in the hospital. Likely switch over to doxycycline upon discharge. Appreciate ENT evaluation. Patient status post incision and drainage with packing placed. 2. COPD exacerbation. Start low dose prednisone. Add nebulizer treatments and budesonide nebulizers 3. Hyponatremia. Improved 4. History of stroke on aspirin and statin 5. History of neuromuscular disorder 6. Essential  hypertension on lisinopril  Code Status:     Code Status Orders        Start     Ordered   08/17/16 0927  Full code  Continuous     08/17/16 0926    Code Status History    Date Active Date Inactive Code Status Order ID Comments User Context   03/12/2015  9:28 PM 03/14/2015  6:45 PM Full Code [id number]  Houston Siren, MD Inpatient     Disposition Plan: Likely back to group home tomorrow  Consultants:  ENT  Antibiotics:  IV vancomycin  Time spent: 25 minutes  Alford Highland  [employer]

## 2016-08-18 NOTE — Clinical Social Work Note (Signed)
Clinical Social Work Assessment  Patient Details  Name: Cameron Ford MRN: [id number] Date of Birth: 27-Mar-1946  Date of referral:  08/18/16               Reason for consult:  Facility Placement                Permission sought to share information with:  Facility Medical sales representative Permission granted to share information::  Yes, Verbal Permission Granted  Name::     Blake Divine [phone], [place],Hazel Other [phone], or Idalia Needle [phone]   Agency::  [facility]  Relationship::     Contact Information:     Housing/Transportation Living arrangements for the past 2 months:  Group Home Source of Information:  Patient, Facility Patient Interpreter Needed:  None Criminal Activity/Legal Involvement Pertinent to Current Situation/Hospitalization:  No - Comment as needed Significant Relationships:  Other(Comment) (Group Home staff) Lives with:  Facility Resident Do you feel safe going back to the place where you live?  Yes Need for family participation in patient care:  No (Coment)  Care giving concerns:  Patient did not express any concerns about returning back to family care home.   Social Worker assessment / plan:  Patient is a 71 year old male who is alert and oriented x4 and able to express his feelings.  Patient is from [facility] which he has been living for about 2 years.  Patient states he does not have any issues about returning back to group home.  CSW explained role of social worker and informed him what the process is for having patient return to group home.  Patient stated he is hopeful that he will be able to discharge on Friday.  Employment status:  Retired, Disabled (Comment on whether or not currently receiving Disability) Insurance information:  Managed Medicare PT Recommendations:  Not assessed at this time Information / Referral to community resources:     Patient/Family's Response to care:   Patient in agreement to returning back to group home.  Patient/Family's Understanding of and Emotional Response to Diagnosis, Current Treatment, and Prognosis:  Patient is hopeful that he will be able to discharge on Friday.  Emotional Assessment Appearance:  Appears stated age Attitude/Demeanor/Rapport:    Affect (typically observed):  Calm, Appropriate Orientation:  Oriented to Self, Oriented to Place, Oriented to  Time, Oriented to Situation Alcohol / Substance use:  Not Applicable Psych involvement (Current and /or in the community):  No (Comment)  Discharge Needs  Concerns to be addressed:  No discharge needs identified Readmission within the last 30 days:  No Current discharge risk:  None Barriers to Discharge:  Continued Medical Work up   [place] Constable 08/18/2016, 4:41 PM

## 2016-08-18 NOTE — Consult Note (Signed)
Daran, Favaro [id number] Dec 12, 1945 Riley Nearing, MD   SUBJECTIVE: This 71 y.o. year old male is status post I&D of lip abscess at bedside. No new complaints. Unable to get any useful history from patient, he just rambles and complains but seems better overall.  Medications:  Current Facility-Administered Medications  Medication Dose Route Frequency Provider Last Rate Last Dose  . 0.9 %  sodium chloride infusion   Intravenous Continuous Saundra Shelling, MD 75 mL/hr at 08/17/16 2151    . acetaminophen (TYLENOL) tablet 650 mg  650 mg Oral Q6H PRN Saundra Shelling, MD   650 mg at 08/18/16 1024   Or  . acetaminophen (TYLENOL) suppository 650 mg  650 mg Rectal Q6H PRN Saundra Shelling, MD      . aspirin chewable tablet 81 mg  81 mg Oral Daily Pavan Pyreddy, MD   81 mg at 08/18/16 1024  . atorvastatin (LIPITOR) tablet 20 mg  20 mg Oral Daily Saundra Shelling, MD   20 mg at 08/18/16 1024  . benztropine (COGENTIN) tablet 0.5 mg  0.5 mg Oral Q8H Pavan Pyreddy, MD   0.5 mg at 08/18/16 0601  . carbamazepine (TEGRETOL) tablet 200 mg  200 mg Oral BID Saundra Shelling, MD   200 mg at 08/18/16 1024  . Chlorhexidine Gluconate Cloth 2 % PADS 6 each  6 each Topical Q0600 Max Sane, MD   6 each at 08/18/16 0600  . clindamycin (CLEOCIN) IVPB 600 mg  600 mg Intravenous Q8H Saundra Shelling, MD   Stopped at 08/18/16 0630  . enoxaparin (LOVENOX) injection 40 mg  40 mg Subcutaneous Q24H Saundra Shelling, MD   40 mg at 08/18/16 1025  . lactulose (CHRONULAC) 10 GM/15ML solution 20 g  20 g Oral Daily PRN Pavan Pyreddy, MD      . lisinopril (PRINIVIL,ZESTRIL) tablet 10 mg  10 mg Oral Daily Saundra Shelling, MD   10 mg at 08/18/16 1024  . lithium carbonate capsule 300 mg  300 mg Oral BID WC Saundra Shelling, MD   300 mg at 08/18/16 1024  . lubiprostone (AMITIZA) capsule 8 mcg  8 mcg Oral BID WC Saundra Shelling, MD   8 mcg at 08/17/16 1757  . morphine 2 MG/ML injection 2 mg  2 mg Intravenous Q4H PRN Saundra Shelling, MD   2 mg at 08/17/16 1006  .  mupirocin cream (BACTROBAN) 2 %   Topical Daily Vipul Manuella Ghazi, MD      . mupirocin ointment (BACTROBAN) 2 % 1 application  1 application Nasal BID Max Sane, MD   1 application at 03/49/17 1029  . ondansetron (ZOFRAN) tablet 4 mg  4 mg Oral Q6H PRN Saundra Shelling, MD       Or  . ondansetron (ZOFRAN) injection 4 mg  4 mg Intravenous Q6H PRN Pavan Pyreddy, MD      . pneumococcal 23 valent vaccine (PNU-IMMUNE) injection 0.5 mL  0.5 mL Intramuscular Tomorrow-1000 Vipul Shah, MD      . polyethylene glycol (MIRALAX / GLYCOLAX) packet 17 g  17 g Oral BID Saundra Shelling, MD   17 g at 08/18/16 1023  . propranolol (INDERAL) tablet 40 mg  40 mg Oral BID Saundra Shelling, MD   40 mg at 08/18/16 1024  . risperiDONE (RISPERDAL) tablet 1 mg  1 mg Oral Q8H Pavan Pyreddy, MD   1 mg at 08/18/16 0601  . vancomycin (VANCOCIN) IVPB 1000 mg/200 mL premix  1,000 mg Intravenous Q12H Saundra Shelling, MD   Stopped at 08/18/16 0359  .  Medications Prior to Admission  Medication Sig Dispense Refill  . acetaminophen (TYLENOL) 325 MG tablet Take 650 mg by mouth 2 (two) times daily.    Marland Kitchen aspirin 81 MG tablet Take 81 mg by mouth daily.    Marland Kitchen atorvastatin (LIPITOR) 20 MG tablet Take 20 mg by mouth daily.     . benztropine (COGENTIN) 0.5 MG tablet Take 0.5 mg by mouth every 8 (eight) hours.     . carbamazepine (TEGRETOL) 200 MG tablet Take 200 mg by mouth 2 (two) times daily.     Marland Kitchen lisinopril (PRINIVIL,ZESTRIL) 10 MG tablet Take 10 mg by mouth daily.    Marland Kitchen lithium carbonate 300 MG capsule Take 300 mg by mouth 2 (two) times daily with a meal.     . lubiprostone (AMITIZA) 8 MCG capsule Take 8 mcg by mouth 2 (two) times daily with a meal.     . polyethylene glycol (MIRALAX / GLYCOLAX) packet Take 17 g by mouth 2 (two) times daily.    . propranolol (INDERAL) 20 MG tablet Take 40 mg by mouth 2 (two) times daily.     . risperiDONE (RISPERDAL) 1 MG tablet Take 1 mg by mouth every 8 (eight) hours.    . risperiDONE microspheres (RISPERDAL  CONSTA) 25 MG injection Inject 25 mg into the muscle every 30 (thirty) days.    Marland Kitchen sulfamethoxazole-trimethoprim (BACTRIM DS,SEPTRA DS) 800-160 MG tablet Take 1 tablet by mouth 2 (two) times daily. 20 tablet 0  . lactulose (CHRONULAC) 10 GM/15ML solution Take 30 mLs (20 g total) by mouth daily as needed for mild constipation. (Patient not taking: Reported on 08/14/2016) 120 mL 0    OBJECTIVE:  PHYSICAL EXAM  Vitals: Blood pressure 131/67, pulse 77, temperature 99.1 F (37.3 C), temperature source Oral, resp. rate 20, height '5\' 8"'  (1.727 m), weight 194 lb 9.6 oz (88.3 kg), SpO2 (!) 89 %.. General: Well-developed, Well-nourished in no acute distress Mood: Mood and affect well adjusted, pleasant and cooperative. Orientation: Grossly alert and oriented. Vocal Quality: No hoarseness. Communicates verbally. head and Face: NCAT. Swelling of upper lip with scant cloudy discharge from I&D site but not as purulent as what was drained yesterday. Lip remains tender and swollen, but less so than yesterday and softer. No significant facial scars. No tenderness with sinus percussion. Facial strength normal and symmetric.  MEDICAL DECISION MAKING: Data Review:  Results for orders placed or performed during the hospital encounter of 08/17/16 (from the past 48 hour(s))  Culture, blood (routine x 2)     Status: None (Preliminary result)   Collection Time: 08/17/16  2:29 AM  Result Value Ref Range   Specimen Description BLOOD LEFT ASSIST CONTROL    Special Requests      BOTTLES DRAWN AEROBIC AND ANAEROBIC Blood Culture adequate volume   Culture NO GROWTH 1 DAY    Report Status PENDING   Culture, blood (routine x 2)     Status: None (Preliminary result)   Collection Time: 08/17/16  2:29 AM  Result Value Ref Range   Specimen Description BLOOD LEFT HAND    Special Requests      BOTTLES DRAWN AEROBIC AND ANAEROBIC Blood Culture adequate volume   Culture NO GROWTH 1 DAY    Report Status PENDING   CBC with  Differential     Status: Abnormal   Collection Time: 08/17/16  2:29 AM  Result Value Ref Range   WBC 11.7 (H) 3.8 - 10.6 K/uL   RBC 4.12 (L) 4.40 -  5.90 MIL/uL   Hemoglobin 13.5 13.0 - 18.0 g/dL   HCT 39.9 (L) 40.0 - 52.0 %   MCV 97.0 80.0 - 100.0 fL   MCH 32.8 26.0 - 34.0 pg   MCHC 33.8 32.0 - 36.0 g/dL   RDW 13.7 11.5 - 14.5 %   Platelets 305 150 - 440 K/uL   Neutrophils Relative % 68 %   Neutro Abs 7.9 (H) 1.4 - 6.5 K/uL   Lymphocytes Relative 16 %   Lymphs Abs 1.9 1.0 - 3.6 K/uL   Monocytes Relative 12 %   Monocytes Absolute 1.4 (H) 0.2 - 1.0 K/uL   Eosinophils Relative 3 %   Eosinophils Absolute 0.3 0 - 0.7 K/uL   Basophils Relative 1 %   Basophils Absolute 0.1 0 - 0.1 K/uL  Basic metabolic panel     Status: Abnormal   Collection Time: 08/17/16  2:29 AM  Result Value Ref Range   Sodium 130 (L) 135 - 145 mmol/L   Potassium 4.4 3.5 - 5.1 mmol/L   Chloride 96 (L) 101 - 111 mmol/L   CO2 26 22 - 32 mmol/L   Glucose, Bld 114 (H) 65 - 99 mg/dL   BUN 19 6 - 20 mg/dL   Creatinine, Ser 0.98 0.61 - 1.24 mg/dL   Calcium 9.2 8.9 - 10.3 mg/dL   GFR calc non Af Amer >60 >60 mL/min   GFR calc Af Amer >60 >60 mL/min    Comment: (NOTE) The eGFR has been calculated using the CKD EPI equation. This calculation has not been validated in all clinical situations. eGFR's persistently <60 mL/min signify possible Chronic Kidney Disease.    Anion gap 8 5 - 15  MRSA PCR Screening     Status: Abnormal   Collection Time: 08/17/16 10:18 AM  Result Value Ref Range   MRSA by PCR POSITIVE (A) NEGATIVE    Comment:        The GeneXpert MRSA Assay (FDA approved for NASAL specimens only), is one component of a comprehensive MRSA colonization surveillance program. It is not intended to diagnose MRSA infection nor to guide or monitor treatment for MRSA infections. RESULT CALLED TO, READ BACK BY AND VERIFIED WITH:  CHRIS Venson Ferencz AT 1147 08/17/16 SDR   Aerobic Culture (superficial specimen)      Status: None (Preliminary result)   Collection Time: 08/17/16  4:57 PM  Result Value Ref Range   Specimen Description MOUTH upper lip    Special Requests NONE    Gram Stain      ABUNDANT WBC PRESENT, PREDOMINANTLY PMN RARE GRAM POSITIVE COCCI IN CLUSTERS IN PAIRS Performed at [facility], [place] [address], [place], [place] [address]    Culture PENDING    Report Status PENDING   CBC     Status: Abnormal   Collection Time: 08/18/16  5:36 AM  Result Value Ref Range   WBC 7.9 3.8 - 10.6 K/uL   RBC 3.86 (L) 4.40 - 5.90 MIL/uL   Hemoglobin 12.8 (L) 13.0 - 18.0 g/dL   HCT 37.4 (L) 40.0 - 52.0 %   MCV 97.0 80.0 - 100.0 fL   MCH 33.3 26.0 - 34.0 pg   MCHC 34.3 32.0 - 36.0 g/dL   RDW 13.5 11.5 - 14.5 %   Platelets 291 150 - 440 K/uL  Basic metabolic panel     Status: Abnormal   Collection Time: 08/18/16  5:36 AM  Result Value Ref Range   Sodium 133 (L) 135 -  145 mmol/L   Potassium 4.3 3.5 - 5.1 mmol/L   Chloride 100 (L) 101 - 111 mmol/L   CO2 28 22 - 32 mmol/L   Glucose, Bld 103 (H) 65 - 99 mg/dL   BUN 12 6 - 20 mg/dL   Creatinine, Ser 0.82 0.61 - 1.24 mg/dL   Calcium 8.7 (L) 8.9 - 10.3 mg/dL   GFR calc non Af Amer >60 >60 mL/min   GFR calc Af Amer >60 >60 mL/min    Comment: (NOTE) The eGFR has been calculated using the CKD EPI equation. This calculation has not been validated in all clinical situations. eGFR's persistently <60 mL/min signify possible Chronic Kidney Disease.    Anion gap 5 5 - 15  . Ct Maxillofacial W Contrast  Result Date: 08/17/2016 CLINICAL DATA:  Initial evaluation for acute infection and swelling at the upper lip. EXAM: CT MAXILLOFACIAL WITH CONTRAST TECHNIQUE: Multidetector CT imaging of the maxillofacial structures was performed. Multiplanar CT image reconstructions were also generated. A small metallic BB was placed on the right temple in order to reliably differentiate right from left. COMPARISON:  None. FINDINGS: Osseous: No acute osseous  abnormality identified within the face. No acute fracture. Linear lucency extending through the bilateral nasal bones favored to be chronic. Mild S shape bowing of the nasal septum noted. Patient is largely edentulous, with no remaining maxillary teeth. Scattered dental caries present about the remaining few mandibular teeth. Orbits: Globes and orbital soft tissues within normal limits. Bony orbits intact. Sinuses: Paranasal sinuses are clear. Mastoids and middle ear cavities are clear. Soft tissues: Soft tissue swelling present at the central aspect of the upper let, concerning for possible cellulitis given provided history. There is superimposed irregular multi loculated hypodense collection, concerning for abscess. This measures approximately 1.9 x 3.7 x 3.0 cm. Surrounding rim enhancement. Remainder the visualized soft tissues of the face within normal limits. Few mildly prominent level 1 B nodes noted, which may be reactive. Limited intracranial: Unremarkable. IMPRESSION: Soft tissue swelling at the upper lip, concerning for infection/cellulitis given provided history. Superimposed irregular 1.9 x 3.7 x 3.0 cm multiloculated collection concerning for abscess. Electronically Signed   By: Jeannine Boga M.D.   On: 08/17/2016 04:26  .   ASSESSMENT: Lip cellulitis s/p I&D of abscess, and seems to be improving. Gram stain c/w staph aureus  PLAN: Continue IV antibiotics. C&S should help with selection of discharge PO antibiotics. Apply gentamicin to upper lip wound TID and nursing can pack wound with iodoform gauze for another day or so, but wound will begin to close down soon. Will sign off. Reconsult if needed.    Riley Nearing, MD 08/18/2016 11:54 AM

## 2016-08-18 NOTE — [place] FL2 (Addendum)
[place] MEDICAID FL2 LEVEL OF CARE SCREENING TOOL     IDENTIFICATION  Patient Name: Cameron Ford Birthdate: 08-Jul-1945 Sex: male Admission Date (Current Location): 08/17/2016  [place] and [place] Number:  [address] [id number] [facility] Facility and Address:  [facility], [address] [place], [place] [address]      Provider Number: [id number]  Attending Physician Name and Address:  Alford Highland, MD  Relative Name and Phone Number:  Blake Divine [phone], Spruill,Hazel Other [phone], or Kathlyn Sacramento Sister [phone]     Current Level of Care: Hospital Recommended Level of Care: Skilled Nursing Facility Prior Approval Number:    Date Approved/Denied:   PASRR Number:    Discharge Plan: Other (Comment) [facility])    Current Diagnoses: Patient Active Problem List   Diagnosis Date Noted  . Cellulitis 08/17/2016  . Chronic constipation 02/02/2016  . HLD (hyperlipidemia) 02/02/2016  . BP (high blood pressure) 02/02/2016  . Has a tremor 02/02/2016  . GI bleed 03/12/2015    Orientation RESPIRATION BLADDER Height & Weight     Self, Time, Situation, Place  Normal Continent Weight: 194 lb 9.6 oz (88.3 kg) Height:   (172.7 cm)  BEHAVIORAL SYMPTOMS/MOOD NEUROLOGICAL BOWEL NUTRITION STATUS      Continent Diet (2 G sodium diet)  AMBULATORY STATUS COMMUNICATION OF NEEDS Skin   Supervision Verbally Surgical wounds                       Personal Care Assistance Level of Assistance  Bathing, Feeding, Dressing Bathing Assistance: Limited assistance Feeding assistance: Independent Dressing Assistance: Limited assistance     Functional Limitations Info  Sight, Hearing, Speech Sight Info: Adequate Hearing Info: Adequate Speech Info: Adequate    SPECIAL CARE FACTORS FREQUENCY                       Contractures Contractures Info: Not present    Additional Factors Info  Code Status,  Allergies Code Status Info: Full Code Allergies Info: NKA    Psychotropic Medications lithium carbonate capsule 300 mg, propranolol (INDERAL) tablet 40 mg, risperiDONE (RISPERDAL) tablet 1 mg         Discharge Medications: Please see discharge summary for a list of discharge medications. Current Discharge Medication List        START taking these medications   Details  albuterol (PROVENTIL HFA;VENTOLIN HFA) 108 (90 Base) MCG/ACT inhaler Inhale 2 puffs into the lungs every 6 (six) hours as needed for wheezing or shortness of breath. Qty: 1 Inhaler, Refills: 0    budesonide-formoterol (SYMBICORT) 80-4.5 MCG/ACT inhaler Inhale 2 puffs into the lungs 2 (two) times daily. Qty: 1 Inhaler, Refills: 0    doxycycline (VIBRA-TABS) 100 MG tablet Take 1 tablet (100 mg total) by mouth every 12 (twelve) hours. Qty: 20 tablet, Refills: 0    gentamicin ointment (GARAMYCIN) 0.1 % Apply topically 2 (two) times daily. Qty: 15 g, Refills: 0          CONTINUE these medications which have NOT CHANGED   Details  acetaminophen (TYLENOL) 325 MG tablet Take 650 mg by mouth 2 (two) times daily.    aspirin 81 MG tablet Take 81 mg by mouth daily.    atorvastatin (LIPITOR) 20 MG tablet Take 20 mg by mouth daily.     benztropine (COGENTIN) 0.5 MG tablet Take 0.5 mg by mouth every 8 (eight) hours.     carbamazepine (TEGRETOL) 200 MG tablet Take 200  mg by mouth 2 (two) times daily.     lisinopril (PRINIVIL,ZESTRIL) 10 MG tablet Take 10 mg by mouth daily.    lithium carbonate 300 MG capsule Take 300 mg by mouth 2 (two) times daily with a meal.     lubiprostone (AMITIZA) 8 MCG capsule Take 8 mcg by mouth 2 (two) times daily with a meal.     polyethylene glycol (MIRALAX / GLYCOLAX) packet Take 17 g by mouth 2 (two) times daily.    propranolol (INDERAL) 20 MG tablet Take 40 mg by mouth 2 (two) times daily.     risperiDONE (RISPERDAL) 1 MG tablet Take 1 mg by mouth every 8 (eight)  hours.    risperiDONE microspheres (RISPERDAL CONSTA) 25 MG injection Inject 25 mg into the muscle every 30 (thirty) days.         STOP taking these medications     sulfamethoxazole-trimethoprim (BACTRIM DS,SEPTRA DS) 800-160 MG tablet      lactulose (CHRONULAC) 10 GM/15ML solution          Relevant Imaging Results:  Relevant Lab Results:   Additional Information    Anterhaus, Ervin Knack, LCSWA

## 2016-08-19 LAB — VANCOMYCIN, TROUGH: Vancomycin Tr: 21 ug/mL (ref 15–20)

## 2016-08-19 MED ORDER — RISPERIDONE MICROSPHERES 25 MG IM SUSR
25.0000 mg | Freq: Once | INTRAMUSCULAR | Status: AC
  Administered 2016-08-19: 25 mg via INTRAMUSCULAR
  Filled 2016-08-19: qty 2

## 2016-08-19 MED ORDER — BUDESONIDE-FORMOTEROL FUMARATE 80-4.5 MCG/ACT IN AERO: 2.0000 | INHALATION_SPRAY | Freq: Two times a day (BID) | RESPIRATORY_TRACT | 0 refills | Status: AC

## 2016-08-19 MED ORDER — ALBUTEROL SULFATE HFA 108 (90 BASE) MCG/ACT IN AERS: 2.0000 | INHALATION_SPRAY | Freq: Four times a day (QID) | RESPIRATORY_TRACT | 0 refills | Status: DC | PRN

## 2016-08-19 MED ORDER — GENTAMICIN SULFATE 0.1 % EX OINT: TOPICAL_OINTMENT | Freq: Two times a day (BID) | CUTANEOUS | 0 refills | Status: DC

## 2016-08-19 MED ORDER — DOXYCYCLINE HYCLATE 100 MG PO TABS: 100.0000 mg | ORAL_TABLET | Freq: Two times a day (BID) | ORAL | 0 refills | Status: DC

## 2016-08-19 MED ORDER — DOXYCYCLINE HYCLATE 100 MG PO TABS
100.0000 mg | ORAL_TABLET | Freq: Two times a day (BID) | ORAL | Status: DC
  Administered 2016-08-19: 11:00:00 100 mg via ORAL
  Filled 2016-08-19 (×2): qty 1

## 2016-08-19 NOTE — Clinical Social Work Note (Signed)
Pt is ready for discharge today. Facility is ready to accept him and will provide transportation. CSW updated DSS guardian and left a message regarding pt's discharge. RN is aware. Discharge packet is prepared. CW is signing off as no further needs identified.   Dede Query, MSW, LCSW  Clinical Social Worker  [phone]

## 2016-08-19 NOTE — Discharge Instructions (Signed)

## 2016-08-19 NOTE — Evaluation (Signed)
Physical Therapy Evaluation Patient Details Name: Cameron Ford MRN: [id number] DOB: April 25, 1946 Today's Date: 08/19/2016   History of Present Illness  Pt admitted for cellulitis secodnary to spider bite on upper lip. History includes bipolar, dementia, HTN, and CVA. Pt is s/p I&D on 08/17/16.   Clinical Impression  Pt is a pleasant 71 year old male who was admitted for cellulitis from spider bite. Pt performs bed mobility with supervision, transfers with mod I, and ambulation with cga without AD. Pt reaching out for furniture during ambulation, needs B UE assistance for balance. Further mobility performed in hallway using RW adjusted to height. Pt does well with RW demonstrating improved balance using AD. Recommend continued use of RW or rollater at group home. Pt demonstrates deficits with strength/balance/endurance. Would benefit from skilled PT to address above deficits and promote optimal return to PLOF. Will continue to assess during acute care hospital stay.      Follow Up Recommendations No PT follow up    Equipment Recommendations  None recommended by PT (has RW at home)    Recommendations for Other Services       Precautions / Restrictions Precautions Precautions: Fall Restrictions Weight Bearing Restrictions: No      Mobility  Bed Mobility Overal bed mobility: Needs Assistance Bed Mobility: Supine to Sit     Supine to sit: Supervision     General bed mobility comments: safe technique performed with cues  Transfers Overall transfer level: Modified independent Equipment used: None             General transfer comment: upright posture noted with slight B flexion of knees.   Ambulation/Gait Ambulation/Gait assistance: Min guard Ambulation Distance (Feet): 40 Feet Assistive device: None Gait Pattern/deviations: Step-to pattern     General Gait Details: Pt reaching for furniture during ambulation with slow step to gait pattern. Slight unsteadiness  noted.  Stairs            Wheelchair Mobility    Modified Rankin (Stroke Patients Only)       Balance Overall balance assessment: Needs assistance Sitting-balance support: Feet supported Sitting balance-Leahy Scale: Normal     Standing balance support: No upper extremity supported Standing balance-Leahy Scale: Fair                               Pertinent Vitals/Pain Pain Assessment: No/denies pain    Home Living Family/patient expects to be discharged to:: Group home                      Prior Function Level of Independence: Independent         Comments: reports no falls. has rollater for home use     Hand Dominance        Extremity/Trunk Assessment   Upper Extremity Assessment Upper Extremity Assessment:  (tremors noted, B UE grossly 4/5)    Lower Extremity Assessment Lower Extremity Assessment: Generalized weakness (B LE grossly 4+/5)       Communication   Communication: No difficulties  Cognition Arousal/Alertness: Awake/alert Behavior During Therapy: WFL for tasks assessed/performed Overall Cognitive Status: Within Functional Limits for tasks assessed                                        General Comments      Exercises Other Exercises Other  Exercises: Further ambulation performed x 125' with RW in hallway. Forward flexed posture noted with pt looking at ground despite cues for upright posture. Only required cga for ambulation with no LOB noted   Assessment/Plan    PT Assessment Patient needs continued PT services  PT Problem List Decreased strength;Decreased balance;Decreased mobility       PT Treatment Interventions DME instruction;Gait training;Therapeutic exercise    PT Goals (Current goals can be found in the Care Plan section)  Acute Rehab PT Goals Patient Stated Goal: to go home today PT Goal Formulation: With patient Time For Goal Achievement: 09/02/16 Potential to Achieve Goals:  Good    Frequency Min 2X/week   Barriers to discharge        Co-evaluation               End of Session Equipment Utilized During Treatment: Gait belt Activity Tolerance: Patient tolerated treatment well Patient left: in chair;with chair alarm set Nurse Communication: Mobility status PT Visit Diagnosis: Unsteadiness on feet (R26.81);Muscle weakness (generalized) (M62.81);Difficulty in walking, not elsewhere classified (R26.2)    Time: 2542-7062 PT Time Calculation (min) (ACUTE ONLY): 22 min   Charges:   PT Evaluation $PT Eval Low Complexity: 1 Procedure PT Treatments $Gait Training: 8-22 mins   PT G Codes:        Cameron Ford, PT, DPT [phone]   Cameron Ford 08/19/2016, 11:27 AM

## 2016-08-19 NOTE — Discharge Summary (Signed)
[facility] - [place] [facility]   PATIENT NAME: Cameron Ford    MR#:  [id number]  DATE OF BIRTH:  1945-05-14  DATE OF ADMISSION:  08/17/2016 ADMITTING PHYSICIAN: Ihor Austin, MD  DATE OF DISCHARGE: 08/19/2016  PRIMARY CARE PHYSICIAN: Kaleen Mask, MD    ADMISSION DIAGNOSIS:  Cellulitis of face [L03.211] Abscess [L02.91]  DISCHARGE DIAGNOSIS:  Active Problems:   Cellulitis   SECONDARY DIAGNOSIS:   Past Medical History:  Diagnosis Date  . Asthma   . Bipolar 1 disorder (HCC)   . Chronic constipation   . Dementia   . History of small bowel obstruction   . Hyperlipidemia   . Hypertension   . Microalbuminuria   . Neuromuscular disorder (HCC)    tremors  . Stroke [facility])     HOSPITAL COURSE:   1. Upper lip abscess and cellulitis which failed outpatient Bactrim therapy. Given vancomycin while here. ENT did an incision and drainage and packed the wound. We'll give doxycycline upon discharge and gentamicin ointment. Try to set up home health to pack the wound. Hopefully won't need packing from too much longer. 2. COPD exacerbation. Lungs sound better today. Prescribed albuterol inhaler and Symbicort upon going home. Can stop prednisone. 3. Hyponatremia improved 4. History of stroke on aspirin and statins 5. History of neuromuscular disorder. Did well with physical therapy. 6. Essential hypertension on lisinopril 7. Bipolar disorder on numerous medications   DISCHARGE CONDITIONS:   Satisfactory  CONSULTS OBTAINED:    DRUG ALLERGIES:  No Known Allergies  DISCHARGE MEDICATIONS:   Current Discharge Medication List    START taking these medications   Details  albuterol (PROVENTIL HFA;VENTOLIN HFA) 108 (90 Base) MCG/ACT inhaler Inhale 2 puffs into the lungs every 6 (six) hours as needed for wheezing or shortness of breath. Qty: 1 Inhaler, Refills: 0    budesonide-formoterol (SYMBICORT) 80-4.5 MCG/ACT inhaler Inhale 2 puffs into the  lungs 2 (two) times daily. Qty: 1 Inhaler, Refills: 0    doxycycline (VIBRA-TABS) 100 MG tablet Take 1 tablet (100 mg total) by mouth every 12 (twelve) hours. Qty: 20 tablet, Refills: 0    gentamicin ointment (GARAMYCIN) 0.1 % Apply topically 2 (two) times daily. Qty: 15 g, Refills: 0      CONTINUE these medications which have NOT CHANGED   Details  acetaminophen (TYLENOL) 325 MG tablet Take 650 mg by mouth 2 (two) times daily.    aspirin 81 MG tablet Take 81 mg by mouth daily.    atorvastatin (LIPITOR) 20 MG tablet Take 20 mg by mouth daily.     benztropine (COGENTIN) 0.5 MG tablet Take 0.5 mg by mouth every 8 (eight) hours.     carbamazepine (TEGRETOL) 200 MG tablet Take 200 mg by mouth 2 (two) times daily.     lisinopril (PRINIVIL,ZESTRIL) 10 MG tablet Take 10 mg by mouth daily.    lithium carbonate 300 MG capsule Take 300 mg by mouth 2 (two) times daily with a meal.     lubiprostone (AMITIZA) 8 MCG capsule Take 8 mcg by mouth 2 (two) times daily with a meal.     polyethylene glycol (MIRALAX / GLYCOLAX) packet Take 17 g by mouth 2 (two) times daily.    propranolol (INDERAL) 20 MG tablet Take 40 mg by mouth 2 (two) times daily.     risperiDONE (RISPERDAL) 1 MG tablet Take 1 mg by mouth every 8 (eight) hours.    risperiDONE microspheres (RISPERDAL CONSTA) 25 MG injection Inject 25 mg into  the muscle every 30 (thirty) days.      STOP taking these medications     sulfamethoxazole-trimethoprim (BACTRIM DS,SEPTRA DS) 800-160 MG tablet      lactulose (CHRONULAC) 10 GM/15ML solution          DISCHARGE INSTRUCTIONS:   Follow-up PMD one week Follow-up ENT 1 week  If you experience worsening of your admission symptoms, develop shortness of breath, life threatening emergency, suicidal or homicidal thoughts you must seek medical attention immediately by calling 911 or calling your MD immediately  if symptoms less severe.  You Must read complete instructions/literature  along with all the possible adverse reactions/side effects for all the Medicines you take and that have been prescribed to you. Take any new Medicines after you have completely understood and accept all the possible adverse reactions/side effects.   Please note  You were cared for by a hospitalist during your hospital stay. If you have any questions about your discharge medications or the care you received while you were in the hospital after you are discharged, you can call the unit and asked to speak with the hospitalist on call if the hospitalist that took care of you is not available. Once you are discharged, your primary care physician will handle any further medical issues. Please note that NO REFILLS for any discharge medications will be authorized once you are discharged, as it is imperative that you return to your primary care physician (or establish a relationship with a primary care physician if you do not have one) for your aftercare needs so that they can reassess your need for medications and monitor your lab values.    Today   CHIEF COMPLAINT:   Chief Complaint  Patient presents with  . Insect Bite    HISTORY OF PRESENT ILLNESS:  Cameron Ford  is a 71 y.o. male presented with a lip abscess and cellulitis   VITAL SIGNS:  Blood pressure 122/79, pulse 68, temperature 98 F (36.7 C), temperature source Oral, resp. rate 20, height  (1.727 m), weight 88.3 kg (194 lb 9.6 oz), SpO2 90 %.    PHYSICAL EXAMINATION:  GENERAL:  71 y.o.-year-old patient lying in the bed with no acute distress.  EYES: Pupils equal, round, reactive to light and accommodation. No scleral icterus. Extraocular muscles intact.  HEENT: Head atraumatic, normocephalic. Oropharynx and nasopharynx clear.  NECK:  Supple, no jugular venous distention. No thyroid enlargement, no tenderness.  LUNGS: Normal breath sounds bilaterally, no wheezing, rales,rhonchi or crepitation. No use of accessory muscles of  respiration.  CARDIOVASCULAR: S1, S2 normal. No murmurs, rubs, or gallops.  ABDOMEN: Soft, non-tender, non-distended. Bowel sounds present. No organomegaly or mass.  EXTREMITIES: No pedal edema, cyanosis, or clubbing.  NEUROLOGIC: Cranial nerves II through XII are intact. Muscle strength 5/5 in all extremities. Sensation intact. Gait not checked.  PSYCHIATRIC: The patient is alert and oriented x 3.  SKIN: Lip still swollen still slight erythematous. Packing seen    DATA REVIEW:   CBC  Recent Labs Lab 08/18/16 0536  WBC 7.9  HGB 12.8*  HCT 37.4*  PLT 291    Chemistries   Recent Labs Lab 08/18/16 0536  NA 133*  K 4.3  CL 100*  CO2 28  GLUCOSE 103*  BUN 12  CREATININE 0.82  CALCIUM 8.7*     Microbiology Results  Results for orders placed or performed during the hospital encounter of 08/17/16  Culture, blood (routine x 2)     Status: None (Preliminary result)  Collection Time: 08/17/16  2:29 AM  Result Value Ref Range Status   Specimen Description BLOOD LEFT ASSIST CONTROL  Final   Special Requests   Final    BOTTLES DRAWN AEROBIC AND ANAEROBIC Blood Culture adequate volume   Culture NO GROWTH 2 DAYS  Final   Report Status PENDING  Incomplete  Culture, blood (routine x 2)     Status: None (Preliminary result)   Collection Time: 08/17/16  2:29 AM  Result Value Ref Range Status   Specimen Description BLOOD LEFT HAND  Final   Special Requests   Final    BOTTLES DRAWN AEROBIC AND ANAEROBIC Blood Culture adequate volume   Culture NO GROWTH 2 DAYS  Final   Report Status PENDING  Incomplete  MRSA PCR Screening     Status: Abnormal   Collection Time: 08/17/16 10:18 AM  Result Value Ref Range Status   MRSA by PCR POSITIVE (A) NEGATIVE Final    Comment:        The GeneXpert MRSA Assay (FDA approved for NASAL specimens only), is one component of a comprehensive MRSA colonization surveillance program. It is not intended to diagnose MRSA infection nor to guide  or monitor treatment for MRSA infections. RESULT CALLED TO, READ BACK BY AND VERIFIED WITH:  CHRIS BENNETT AT 1147 08/17/16 SDR   Aerobic Culture (superficial specimen)     Status: None (Preliminary result)   Collection Time: 08/17/16  4:57 PM  Result Value Ref Range Status   Specimen Description MOUTH upper lip  Final   Special Requests NONE  Final   Gram Stain   Final    ABUNDANT WBC PRESENT, PREDOMINANTLY PMN RARE GRAM POSITIVE COCCI IN CLUSTERS IN PAIRS Performed at [facility], [address], [place], [place] [address]    Culture PENDING  Incomplete   Report Status PENDING  Incomplete     Management plans discussed with the patient, family and they are in agreement.  CODE STATUS:     Code Status Orders        Start     Ordered   08/17/16 0927  Full code  Continuous     08/17/16 0926    Code Status History    Date Active Date Inactive Code Status Order ID Comments User Context   03/12/2015  9:28 PM 03/14/2015  6:45 PM Full Code [id number]  Houston Siren, MD Inpatient      TOTAL TIME TAKING CARE OF THIS PATIENT: 35 minutes.    Alford Highland M.D on 08/19/2016 at 9:53 AM  Between 7am to 6pm - Pager - [phone]  After 6pm go to [email or web address] - password [email or web address]  [employer] Office  [phone]  CC: Primary care physician; Kaleen Mask, MD

## 2016-08-19 NOTE — Care Management Note (Signed)
Case Management Note  Patient Details  Name: ETHELBERT THAIN MRN: [id number] Date of Birth: 12-Feb-1946  Subjective/Objective:  Discharging today back to grp home. They will transport. TC to [employer] at [facility]. He states staff will be able to perform dailey dressing changes. RNCM will set up home health visit for nursing. They have no agency preference. TC to [facility] who will accept patient. They will make a visit within 24 hours per [place] [facility] [place]. No further needs identified.                    Action/PlanFrances Furbish for SN.   Expected Discharge Date:  08/19/16               Expected Discharge Plan:  Home w Home Health Services  In-House Referral:     Discharge planning Services  CM Consult  Post Acute Care Choice:  Home Health Choice offered to:  [facility] POA / Guardian  DME Arranged:    DME Agency:     HH Arranged:  RN HH Agency:  [facility]  Status of Service:  Completed, signed off  If discussed at Long Length of Stay Meetings, dates discussed:    Additional Comments:  Marily Memos, RN 08/19/2016, 9:50 AM

## 2016-08-19 NOTE — Progress Notes (Signed)
Discussed discharge instructions and medications with pt and Hazel, Engineer, structural from [employer]. IV removed. All questions addressed. Made Hazel, Caregiver from [facility] aware that [facility] care services will be out to see patient tomorrow and she verbalized understanding.   Pt transported home via car by  [employer], Engineer, structural from [employer].  Orson Ape, RN

## 2016-08-20 LAB — AEROBIC CULTURE  (SUPERFICIAL SPECIMEN)

## 2016-08-20 LAB — AEROBIC CULTURE W GRAM STAIN (SUPERFICIAL SPECIMEN)

## 2016-08-22 LAB — CULTURE, BLOOD (ROUTINE X 2)
CULTURE: NO GROWTH
Culture: NO GROWTH
SPECIAL REQUESTS: ADEQUATE
Special Requests: ADEQUATE

## 2016-09-16 ENCOUNTER — Emergency Department: Payer: Medicare Other

## 2016-09-16 ENCOUNTER — Encounter: Payer: Self-pay | Admitting: Emergency Medicine

## 2016-09-16 ENCOUNTER — Emergency Department
Admission: EM | Admit: 2016-09-16 | Discharge: 2016-09-16 | Disposition: A | Discharge status: Home / Self Care | Payer: Medicare Other | Attending: Emergency Medicine | Admitting: Emergency Medicine

## 2016-09-16 DIAGNOSIS — Z7982 Long term (current) use of aspirin: Secondary | ICD-10-CM | POA: Insufficient documentation

## 2016-09-16 DIAGNOSIS — J45909 Unspecified asthma, uncomplicated: Secondary | ICD-10-CM | POA: Diagnosis not present

## 2016-09-16 DIAGNOSIS — I10 Essential (primary) hypertension: Secondary | ICD-10-CM | POA: Diagnosis not present

## 2016-09-16 DIAGNOSIS — F17200 Nicotine dependence, unspecified, uncomplicated: Secondary | ICD-10-CM | POA: Insufficient documentation

## 2016-09-16 DIAGNOSIS — M79605 Pain in left leg: Secondary | ICD-10-CM

## 2016-09-16 DIAGNOSIS — M79662 Pain in left lower leg: Secondary | ICD-10-CM | POA: Diagnosis not present

## 2016-09-16 DIAGNOSIS — M7989 Other specified soft tissue disorders: Secondary | ICD-10-CM | POA: Diagnosis present
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
## 2016-09-16 NOTE — ED Notes (Signed)
Pt presents with "swole blood vessels" in leg. Denies pain; but caregiver states that he was c/o pain earlier today. Pt points to areas on upper and lower leg that he says have swollen veins; legs symmetrical. Pt is unhappy to be undressed and placed in gown, says he doesn't like to have his clothes taken off. Pt reassured that clothing is within sight; this nurse offered to put clothing on bed with pt - he declined. Pt alert & oriented. NAD Noted.

## 2016-09-16 NOTE — ED Triage Notes (Signed)
Pt to ED [facility] from [facility] for bilateral leg swelling. Pt states that this has been going on for a while. Pt is also c/o  Pain in both legs when walking. No discoloration or warmth noted.

## 2016-09-16 NOTE — ED Notes (Signed)
Pt's bp low - 85/49, 82/51 in right arm; 95/54 in left; EDP notified. Low bp noted in last visit, but EDP notified.

## 2016-09-26 ENCOUNTER — Emergency Department
Admission: EM | Admit: 2016-09-26 | Discharge: 2016-09-26 | Disposition: A | Discharge status: Home / Self Care | Payer: Medicare Other | Attending: Emergency Medicine | Admitting: Emergency Medicine

## 2016-09-26 ENCOUNTER — Emergency Department: Payer: Medicare Other

## 2016-09-26 ENCOUNTER — Encounter: Payer: Self-pay | Admitting: Emergency Medicine

## 2016-09-26 DIAGNOSIS — Z79899 Other long term (current) drug therapy: Secondary | ICD-10-CM | POA: Insufficient documentation

## 2016-09-26 DIAGNOSIS — F17200 Nicotine dependence, unspecified, uncomplicated: Secondary | ICD-10-CM | POA: Diagnosis not present

## 2016-09-26 DIAGNOSIS — I10 Essential (primary) hypertension: Secondary | ICD-10-CM | POA: Insufficient documentation

## 2016-09-26 DIAGNOSIS — J45909 Unspecified asthma, uncomplicated: Secondary | ICD-10-CM | POA: Diagnosis not present

## 2016-09-26 DIAGNOSIS — Z7982 Long term (current) use of aspirin: Secondary | ICD-10-CM | POA: Insufficient documentation

## 2016-09-26 DIAGNOSIS — R51 Headache: Secondary | ICD-10-CM | POA: Diagnosis not present

## 2016-09-26 DIAGNOSIS — R531 Weakness: Secondary | ICD-10-CM | POA: Diagnosis present

## 2016-09-26 LAB — CBC
HEMATOCRIT: 41.9 % (ref 40.0–52.0)
HEMOGLOBIN: 14.3 g/dL (ref 13.0–18.0)
MCH: 33 pg (ref 26.0–34.0)
MCHC: 34 g/dL (ref 32.0–36.0)
MCV: 97 fL (ref 80.0–100.0)
Platelets: 349 10*3/uL (ref 150–440)
RBC: 4.32 MIL/uL — AB (ref 4.40–5.90)
RDW: 14.8 % — AB (ref 11.5–14.5)
WBC: 7.5 10*3/uL (ref 3.8–10.6)

## 2016-09-26 LAB — TROPONIN I: Troponin I: <0.03 ng/mL (ref <0.03)

## 2016-09-26 LAB — URINALYSIS, COMPLETE (UACMP) WITH MICROSCOPIC
BACTERIA UA: NONE SEEN
BILIRUBIN URINE: NEGATIVE
Glucose, UA: NEGATIVE mg/dL
Hgb urine dipstick: NEGATIVE
KETONES UR: NEGATIVE mg/dL
LEUKOCYTES UA: NEGATIVE
NITRITE: NEGATIVE
PH: 7 (ref 5.0–8.0)
PROTEIN: NEGATIVE mg/dL
RBC / HPF: NONE SEEN RBC/hpf (ref 0–5)
Specific Gravity, Urine: 1.004 — ABNORMAL LOW (ref 1.005–1.030)

## 2016-09-26 LAB — COMPREHENSIVE METABOLIC PANEL
ALBUMIN: 4.2 g/dL (ref 3.5–5.0)
ALT: 12 U/L — ABNORMAL LOW (ref 17–63)
ANION GAP: 7 (ref 5–15)
AST: 18 U/L (ref 15–41)
Alkaline Phosphatase: 99 U/L (ref 38–126)
BILIRUBIN TOTAL: 0.6 mg/dL (ref 0.3–1.2)
BUN: 9 mg/dL (ref 6–20)
CHLORIDE: 98 mmol/L — AB (ref 101–111)
CO2: 31 mmol/L (ref 22–32)
Calcium: 9.6 mg/dL (ref 8.9–10.3)
Creatinine, Ser: 1.04 mg/dL (ref 0.61–1.24)
GFR calc Af Amer: >60 mL/min (ref >60)
GFR calc non Af Amer: >60 mL/min (ref >60)
GLUCOSE: 111 mg/dL — AB (ref 65–99)
POTASSIUM: 4.5 mmol/L (ref 3.5–5.1)
Sodium: 136 mmol/L (ref 135–145)
TOTAL PROTEIN: 8 g/dL (ref 6.5–8.1)

## 2016-09-26 MED ORDER — SODIUM CHLORIDE 0.9 % IV BOLUS (SEPSIS)
1000.0000 mL | Freq: Once | INTRAVENOUS | Status: AC
  Administered 2016-09-26: 1000 mL via INTRAVENOUS

## 2016-09-26 NOTE — ED Notes (Signed)
Pt given sandwich tray and drink.  Pt aware of discharge. IVF infusing at this time.  Pt has tremors which cause heart monitor to show irregular rhythm. MD already aware.

## 2016-09-26 NOTE — ED Triage Notes (Signed)
Pt presents [facility] ems from [facility]. Pt reports that he has trouble walking and has had trouble speaking today. Caregiver states that pt was seen here for cellulitis recently and has been c/o different issues since then. Caregiver also states that pt recently had medications adjusted, including d/c of cogentin. Caregiver reports that pt had excessive drooling and nasal dripping today. Pt has baseline tremors but caregiver states that the tremors are worse than they have been. Pt alert & oriented.

## 2016-09-26 NOTE — ED Notes (Signed)
[phone] Cari CarawayDennis Hatfield is caregiver at community care home

## 2016-09-26 NOTE — ED Notes (Signed)
Patient's caregiver Derrel NipDennis Hatchfield called for transport home.

## 2016-09-26 NOTE — ED Notes (Signed)
Caregiver states that pt's pcp told him a week ago to "hold" lisinopril, lipitor, and d/c cogentin due to his age. He thinks the lisinopril and lipitor were held due to the pt's leg pain.

## 2016-09-26 NOTE — ED Notes (Signed)
Caregiver at bedside

## 2016-09-26 NOTE — ED Notes (Signed)
Pt discharged home after verbalizing understanding of discharge instructions; nad noted. 
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
## 2016-11-17 ENCOUNTER — Emergency Department
Admission: EM | Admit: 2016-11-17 | Discharge: 2016-11-17 | Disposition: A | Discharge status: Home / Self Care | Payer: Medicare Other | Attending: Emergency Medicine | Admitting: Emergency Medicine

## 2016-11-17 ENCOUNTER — Encounter: Payer: Self-pay | Admitting: Emergency Medicine

## 2016-11-17 DIAGNOSIS — X58XXXA Exposure to other specified factors, initial encounter: Secondary | ICD-10-CM | POA: Diagnosis not present

## 2016-11-17 DIAGNOSIS — F17210 Nicotine dependence, cigarettes, uncomplicated: Secondary | ICD-10-CM | POA: Diagnosis not present

## 2016-11-17 DIAGNOSIS — R251 Tremor, unspecified: Secondary | ICD-10-CM | POA: Insufficient documentation

## 2016-11-17 DIAGNOSIS — I10 Essential (primary) hypertension: Secondary | ICD-10-CM | POA: Insufficient documentation

## 2016-11-17 DIAGNOSIS — T50901A Poisoning by unspecified drugs, medicaments and biological substances, accidental (unintentional), initial encounter: Secondary | ICD-10-CM | POA: Diagnosis not present

## 2016-11-17 DIAGNOSIS — T56891A Toxic effect of other metals, accidental (unintentional), initial encounter: Secondary | ICD-10-CM

## 2016-11-17 DIAGNOSIS — J45909 Unspecified asthma, uncomplicated: Secondary | ICD-10-CM | POA: Insufficient documentation

## 2016-11-17 LAB — COMPREHENSIVE METABOLIC PANEL
ALBUMIN: 3.8 g/dL (ref 3.5–5.0)
ALT: 12 U/L — ABNORMAL LOW (ref 17–63)
AST: 26 U/L (ref 15–41)
Alkaline Phosphatase: 87 U/L (ref 38–126)
Anion gap: 5 (ref 5–15)
BUN: 19 mg/dL (ref 6–20)
CHLORIDE: 102 mmol/L (ref 101–111)
CO2: 26 mmol/L (ref 22–32)
Calcium: 9.2 mg/dL (ref 8.9–10.3)
Creatinine, Ser: 1.28 mg/dL — ABNORMAL HIGH (ref 0.61–1.24)
GFR calc Af Amer: >60 mL/min (ref >60)
GFR, EST NON AFRICAN AMERICAN: 54 mL/min — AB (ref >60)
GLUCOSE: 126 mg/dL — AB (ref 65–99)
POTASSIUM: 4.8 mmol/L (ref 3.5–5.1)
Sodium: 133 mmol/L — ABNORMAL LOW (ref 135–145)
Total Bilirubin: 0.8 mg/dL (ref 0.3–1.2)
Total Protein: 7 g/dL (ref 6.5–8.1)

## 2016-11-17 LAB — CARBAMAZEPINE LEVEL, TOTAL: CARBAMAZEPINE LVL: 5.5 ug/mL (ref 4.0–12.0)

## 2016-11-17 LAB — CBC WITH DIFFERENTIAL/PLATELET
BASOS ABS: 0 10*3/uL (ref 0–0.1)
BASOS PCT: 1 %
EOS PCT: 3 %
Eosinophils Absolute: 0.2 10*3/uL (ref 0–0.7)
HCT: 39.4 % — ABNORMAL LOW (ref 40.0–52.0)
Hemoglobin: 13.4 g/dL (ref 13.0–18.0)
Lymphocytes Relative: 24 %
Lymphs Abs: 1.5 10*3/uL (ref 1.0–3.6)
MCH: 34.4 pg — ABNORMAL HIGH (ref 26.0–34.0)
MCHC: 34.1 g/dL (ref 32.0–36.0)
MCV: 101.1 fL — ABNORMAL HIGH (ref 80.0–100.0)
MONO ABS: 0.5 10*3/uL (ref 0.2–1.0)
Monocytes Relative: 9 %
Neutro Abs: 4 10*3/uL (ref 1.4–6.5)
Neutrophils Relative %: 63 %
PLATELETS: 224 10*3/uL (ref 150–440)
RBC: 3.9 MIL/uL — ABNORMAL LOW (ref 4.40–5.90)
RDW: 14 % (ref 11.5–14.5)
WBC: 6.3 10*3/uL (ref 3.8–10.6)

## 2016-11-17 LAB — TROPONIN I

## 2016-11-17 LAB — LITHIUM LEVEL: Lithium Lvl: 1.95 mmol/L (ref 0.60–1.20)

## 2016-11-17 MED ORDER — SODIUM CHLORIDE 0.9 % IV SOLN
Freq: Once | INTRAVENOUS | Status: AC
  Administered 2016-11-17: 08:00:00 via INTRAVENOUS

## 2016-11-17 NOTE — ED Notes (Addendum)
Spoke with Cameron Ford legal guardian and informed her of pt discharge and dx. Remains to wait on ride from group home

## 2016-11-17 NOTE — ED Triage Notes (Signed)
Patient presents to ED via [facility] from [facility] for tremors. Patient denies any other symptom at this time.

## 2016-11-17 NOTE — ED Notes (Signed)
Patient made aware of need of urine sample. States he is unable to void at this time. 

## 2016-11-17 NOTE — ED Notes (Signed)
Attempted to call patients sister (emergency contact). No answer. Voice mail left. Also attempted to call patients group home. No answer.

## 2016-11-17 NOTE — ED Notes (Signed)
MD aware of BP
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
## 2016-11-17 NOTE — ED Notes (Signed)
Attempted to call main social service number [phone]. No answer. Unable to leave voicemail at this time.

## 2016-11-17 NOTE — ED Notes (Signed)
Report given to Denis from patients group home. He states he will be able to pick up patient and is on his way.

## 2016-11-17 NOTE — ED Notes (Signed)
Attempted to call patients legal guardian Cameron Ford(Cameron Ford) [phone]. No answer at this time. Voice mail left.

## 2016-11-17 NOTE — ED Notes (Signed)
Patient given crackers

## 2018-08-31 ENCOUNTER — Encounter: Payer: Self-pay | Admitting: Emergency Medicine

## 2018-08-31 ENCOUNTER — Emergency Department: Payer: Medicare Other

## 2018-08-31 ENCOUNTER — Inpatient Hospital Stay
Admission: EM | Admit: 2018-08-31 | Discharge: 2018-09-04 | DRG: 390 | Disposition: A | Discharge status: Home / Self Care | Payer: Medicare Other | Source: Ambulatory Visit | Attending: Surgery | Admitting: Surgery

## 2018-08-31 ENCOUNTER — Other Ambulatory Visit: Payer: Self-pay

## 2018-08-31 DIAGNOSIS — J45909 Unspecified asthma, uncomplicated: Secondary | ICD-10-CM | POA: Diagnosis present

## 2018-08-31 DIAGNOSIS — F319 Bipolar disorder, unspecified: Secondary | ICD-10-CM | POA: Diagnosis present

## 2018-08-31 DIAGNOSIS — K5651 Intestinal adhesions [bands], with partial obstruction: Principal | ICD-10-CM | POA: Diagnosis present

## 2018-08-31 DIAGNOSIS — I10 Essential (primary) hypertension: Secondary | ICD-10-CM | POA: Diagnosis present

## 2018-08-31 DIAGNOSIS — K5909 Other constipation: Secondary | ICD-10-CM | POA: Diagnosis present

## 2018-08-31 DIAGNOSIS — R251 Tremor, unspecified: Secondary | ICD-10-CM | POA: Diagnosis present

## 2018-08-31 DIAGNOSIS — Z8673 Personal history of transient ischemic attack (TIA), and cerebral infarction without residual deficits: Secondary | ICD-10-CM | POA: Diagnosis not present

## 2018-08-31 DIAGNOSIS — Z20828 Contact with and (suspected) exposure to other viral communicable diseases: Secondary | ICD-10-CM | POA: Diagnosis present

## 2018-08-31 DIAGNOSIS — Z7982 Long term (current) use of aspirin: Secondary | ICD-10-CM | POA: Diagnosis not present

## 2018-08-31 DIAGNOSIS — E785 Hyperlipidemia, unspecified: Secondary | ICD-10-CM | POA: Diagnosis present

## 2018-08-31 DIAGNOSIS — Z7951 Long term (current) use of inhaled steroids: Secondary | ICD-10-CM | POA: Diagnosis not present

## 2018-08-31 DIAGNOSIS — K56609 Unspecified intestinal obstruction, unspecified as to partial versus complete obstruction: Secondary | ICD-10-CM

## 2018-08-31 LAB — TROPONIN I: Troponin I: <0.03 ng/mL (ref <0.03)

## 2018-08-31 LAB — COMPREHENSIVE METABOLIC PANEL
ALT: 22 U/L (ref 0–44)
AST: 30 U/L (ref 15–41)
Albumin: 4.3 g/dL (ref 3.5–5.0)
Alkaline Phosphatase: 87 U/L (ref 38–126)
Anion gap: 10 (ref 5–15)
BUN: 19 mg/dL (ref 8–23)
CO2: 29 mmol/L (ref 22–32)
Calcium: 9.7 mg/dL (ref 8.9–10.3)
Chloride: 95 mmol/L — ABNORMAL LOW (ref 98–111)
Creatinine, Ser: 0.91 mg/dL (ref 0.61–1.24)
GFR calc Af Amer: >60 mL/min (ref >60)
GFR calc non Af Amer: >60 mL/min (ref >60)
Glucose, Bld: 140 mg/dL — ABNORMAL HIGH (ref 70–99)
Potassium: 4.7 mmol/L (ref 3.5–5.1)
Sodium: 134 mmol/L — ABNORMAL LOW (ref 135–145)
Total Bilirubin: 1 mg/dL (ref 0.3–1.2)
Total Protein: 8.1 g/dL (ref 6.5–8.1)

## 2018-08-31 LAB — CBC WITH DIFFERENTIAL/PLATELET
Abs Immature Granulocytes: 0.02 10*3/uL (ref 0.00–0.07)
Basophils Absolute: 0 10*3/uL (ref 0.0–0.1)
Basophils Relative: 0 %
Eosinophils Absolute: 0 10*3/uL (ref 0.0–0.5)
Eosinophils Relative: 0 %
HCT: 46 % (ref 39.0–52.0)
Hemoglobin: 15.3 g/dL (ref 13.0–17.0)
Immature Granulocytes: 0 %
Lymphocytes Relative: 8 %
Lymphs Abs: 0.8 10*3/uL (ref 0.7–4.0)
MCH: 33.6 pg (ref 26.0–34.0)
MCHC: 33.3 g/dL (ref 30.0–36.0)
MCV: 100.9 fL — ABNORMAL HIGH (ref 80.0–100.0)
Monocytes Absolute: 0.9 10*3/uL (ref 0.1–1.0)
Monocytes Relative: 9 %
Neutro Abs: 7.7 10*3/uL (ref 1.7–7.7)
Neutrophils Relative %: 83 %
Platelets: 282 10*3/uL (ref 150–400)
RBC: 4.56 MIL/uL (ref 4.22–5.81)
RDW: 14.4 % (ref 11.5–15.5)
WBC: 9.5 10*3/uL (ref 4.0–10.5)
nRBC: 0 % (ref 0.0–0.2)

## 2018-08-31 LAB — SARS CORONAVIRUS 2 BY RT PCR (HOSPITAL ORDER, PERFORMED IN ~~LOC~~ HOSPITAL LAB): SARS Coronavirus 2: NEGATIVE

## 2018-08-31 MED ORDER — ONDANSETRON 4 MG PO TBDP: 4.0000 mg | ORAL_TABLET | Freq: Once | ORAL | Status: DC

## 2018-08-31 MED ORDER — ONDANSETRON HCL 4 MG/2ML IJ SOLN: 4.0000 mg | Freq: Four times a day (QID) | INTRAMUSCULAR | Status: DC | PRN

## 2018-08-31 MED ORDER — ALBUTEROL SULFATE (2.5 MG/3ML) 0.083% IN NEBU: 3.0000 mL | INHALATION_SOLUTION | Freq: Four times a day (QID) | RESPIRATORY_TRACT | Status: DC | PRN

## 2018-08-31 MED ORDER — ATORVASTATIN CALCIUM 20 MG PO TABS
20.0000 mg | ORAL_TABLET | Freq: Every day | ORAL | Status: DC
  Administered 2018-09-01 – 2018-09-04 (×4): 20 mg via ORAL
  Filled 2018-08-31 (×4): qty 1

## 2018-08-31 MED ORDER — SODIUM CHLORIDE 0.9 % IV BOLUS
1000.0000 mL | Freq: Once | INTRAVENOUS | Status: AC
  Administered 2018-08-31: 1000 mL via INTRAVENOUS

## 2018-08-31 MED ORDER — IOHEXOL 300 MG/ML  SOLN
100.0000 mL | Freq: Once | INTRAMUSCULAR | Status: AC | PRN
  Administered 2018-08-31: 100 mL via INTRAVENOUS
  Filled 2018-08-31: qty 100

## 2018-08-31 MED ORDER — CARBAMAZEPINE 200 MG PO TABS
200.0000 mg | ORAL_TABLET | Freq: Two times a day (BID) | ORAL | Status: DC
  Administered 2018-09-01 – 2018-09-04 (×7): 200 mg via ORAL
  Filled 2018-08-31 (×8): qty 1

## 2018-08-31 MED ORDER — RISPERIDONE 1 MG PO TABS
1.0000 mg | ORAL_TABLET | Freq: Three times a day (TID) | ORAL | Status: DC
  Administered 2018-09-01 – 2018-09-04 (×11): 1 mg via ORAL
  Filled 2018-08-31 (×12): qty 1

## 2018-08-31 MED ORDER — ENOXAPARIN SODIUM 40 MG/0.4ML ~~LOC~~ SOLN
40.0000 mg | SUBCUTANEOUS | Status: DC
  Administered 2018-09-01 – 2018-09-03 (×4): 40 mg via SUBCUTANEOUS
  Filled 2018-08-31 (×4): qty 0.4

## 2018-08-31 MED ORDER — ONDANSETRON HCL 4 MG/2ML IJ SOLN
4.0000 mg | Freq: Once | INTRAMUSCULAR | Status: AC
  Administered 2018-08-31: 4 mg via INTRAVENOUS
  Filled 2018-08-31: qty 2

## 2018-08-31 MED ORDER — LISINOPRIL 10 MG PO TABS
10.0000 mg | ORAL_TABLET | Freq: Every day | ORAL | Status: DC
  Administered 2018-09-01: 10 mg via ORAL
  Filled 2018-08-31: qty 1

## 2018-08-31 MED ORDER — KETOROLAC TROMETHAMINE 30 MG/ML IJ SOLN
15.0000 mg | Freq: Four times a day (QID) | INTRAMUSCULAR | Status: DC | PRN
  Administered 2018-09-02: 15 mg via INTRAVENOUS
  Filled 2018-08-31: qty 1

## 2018-08-31 MED ORDER — LITHIUM CARBONATE 300 MG PO CAPS
300.0000 mg | ORAL_CAPSULE | Freq: Two times a day (BID) | ORAL | Status: DC
  Administered 2018-09-01 – 2018-09-04 (×7): 300 mg via ORAL
  Filled 2018-08-31 (×8): qty 1

## 2018-08-31 MED ORDER — ONDANSETRON 4 MG PO TBDP: 4.0000 mg | ORAL_TABLET | Freq: Four times a day (QID) | ORAL | Status: DC | PRN

## 2018-08-31 MED ORDER — DEXTROSE-NACL 5-0.45 % IV SOLN
INTRAVENOUS | Status: DC
  Administered 2018-08-31 – 2018-09-02 (×5): via INTRAVENOUS

## 2018-08-31 MED ORDER — MORPHINE SULFATE (PF) 2 MG/ML IV SOLN: 2.0000 mg | INTRAVENOUS | Status: DC | PRN

## 2018-08-31 MED ORDER — PROPRANOLOL HCL 40 MG PO TABS
40.0000 mg | ORAL_TABLET | Freq: Two times a day (BID) | ORAL | Status: DC
  Administered 2018-09-01: 40 mg via ORAL
  Filled 2018-08-31 (×3): qty 1

## 2018-08-31 MED ORDER — LUBIPROSTONE 8 MCG PO CAPS
8.0000 ug | ORAL_CAPSULE | Freq: Two times a day (BID) | ORAL | Status: DC
  Administered 2018-09-01 – 2018-09-04 (×7): 8 ug via ORAL
  Filled 2018-08-31 (×8): qty 1

## 2018-08-31 MED ORDER — RISPERIDONE MICROSPHERES ER 25 MG IM SRER: 25.0000 mg | INTRAMUSCULAR | Status: DC

## 2018-08-31 NOTE — ED Notes (Signed)
Pt gave verbal permission for me to call Jerrye Beavers Spruill at care home and update her. I called her and gave a generic update.

## 2018-08-31 NOTE — ED Triage Notes (Signed)
Pt sent from [facility] for covid testing. Pt tested positive for influenza at [facility]. Pt with cough, intermittent SOB, n/v since last night.

## 2018-08-31 NOTE — ED Notes (Signed)
Pt legal guardian is [facility].    Jewel Archie Patten, Guardian Social Worker with [employer], [phone]  Rodena Medin, Program manager (makes decisions for invasive procedures), [phone]  Mitchel Honour, Programmer, multimedia, [phone]
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
## 2018-08-31 NOTE — ED Notes (Signed)
Call Reggie Spruill for transport.
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
## 2018-09-01 ENCOUNTER — Inpatient Hospital Stay: Payer: Medicare Other

## 2018-09-01 LAB — INFLUENZA PANEL BY PCR (TYPE A & B)
Influenza A By PCR: NEGATIVE
Influenza B By PCR: NEGATIVE

## 2018-09-01 LAB — CBC
HCT: 39.7 % (ref 39.0–52.0)
Hemoglobin: 13.2 g/dL (ref 13.0–17.0)
MCH: 33.2 pg (ref 26.0–34.0)
MCHC: 33.2 g/dL (ref 30.0–36.0)
MCV: 99.7 fL (ref 80.0–100.0)
Platelets: 242 10*3/uL (ref 150–400)
RBC: 3.98 MIL/uL — ABNORMAL LOW (ref 4.22–5.81)
RDW: 14.4 % (ref 11.5–15.5)
WBC: 7.2 10*3/uL (ref 4.0–10.5)
nRBC: 0 % (ref 0.0–0.2)

## 2018-09-01 LAB — BASIC METABOLIC PANEL
Anion gap: 7 (ref 5–15)
BUN: 17 mg/dL (ref 8–23)
CO2: 31 mmol/L (ref 22–32)
Calcium: 8.5 mg/dL — ABNORMAL LOW (ref 8.9–10.3)
Chloride: 99 mmol/L (ref 98–111)
Creatinine, Ser: 0.86 mg/dL (ref 0.61–1.24)
GFR calc Af Amer: >60 mL/min (ref >60)
GFR calc non Af Amer: >60 mL/min (ref >60)
Glucose, Bld: 138 mg/dL — ABNORMAL HIGH (ref 70–99)
Potassium: 3.9 mmol/L (ref 3.5–5.1)
Sodium: 137 mmol/L (ref 135–145)

## 2018-09-01 LAB — NOVEL CORONAVIRUS, NAA (HOSP ORDER, SEND-OUT TO REF LAB; TAT 18-24 HRS): SARS-CoV-2, NAA: NOT DETECTED

## 2018-09-01 MED ORDER — OSELTAMIVIR PHOSPHATE 75 MG PO CAPS
75.0000 mg | ORAL_CAPSULE | Freq: Two times a day (BID) | ORAL | Status: DC
  Administered 2018-09-01 – 2018-09-04 (×7): 75 mg via ORAL
  Filled 2018-09-01 (×7): qty 1

## 2018-09-01 MED ORDER — SODIUM CHLORIDE 0.9 % IV BOLUS
500.0000 mL | Freq: Once | INTRAVENOUS | Status: AC
  Administered 2018-09-01: 500 mL via INTRAVENOUS

## 2018-09-01 MED ORDER — DIATRIZOATE MEGLUMINE & SODIUM 66-10 % PO SOLN
90.0000 mL | Freq: Once | ORAL | Status: AC
  Administered 2018-09-01: 90 mL via NASOGASTRIC

## 2018-09-01 NOTE — Plan of Care (Addendum)
NG tube to LIWS. Contrast given for abdominal images to be obtained. Voiding. Surgeon notified about low blood pressure today. Indicated to hold BP meds for now.   BP improving yet still soft 100/44 (MAP 61). The images from the abdomen will be obtained around 7:45pm.  .Problem: Education: Goal: Knowledge of General Education information will improve Description Including pain rating scale, medication(s)/side effects and non-pharmacologic comfort measures Outcome: Progressing   Problem: Health Behavior/Discharge Planning: Goal: Ability to manage health-related needs will improve Outcome: Progressing   Problem: Clinical Measurements: Goal: Ability to maintain clinical measurements within normal limits will improve Outcome: Progressing Goal: Will remain free from infection Outcome: Progressing Goal: Diagnostic test results will improve Outcome: Progressing Goal: Respiratory complications will improve Outcome: Progressing Goal: Cardiovascular complication will be avoided Outcome: Progressing   Problem: Activity: Goal: Risk for activity intolerance will decrease Outcome: Progressing   Problem: Nutrition: Goal: Adequate nutrition will be maintained Outcome: Progressing   Problem: Coping: Goal: Level of anxiety will decrease Outcome: Progressing   Problem: Elimination: Goal: Will not experience complications related to bowel motility Outcome: Progressing Goal: Will not experience complications related to urinary retention Outcome: Progressing   Problem: Pain Managment: Goal: General experience of comfort will improve Outcome: Progressing   Problem: Safety: Goal: Ability to remain free from injury will improve Outcome: Progressing   Problem: Skin Integrity: Goal: Risk for impaired skin integrity will decrease Outcome: Progressing

## 2018-09-01 NOTE — Progress Notes (Signed)
Surgeon notified: BP is 181/157, HR 82 this morning. Lisinopril and propranolol given.
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
## 2018-09-01 NOTE — Progress Notes (Signed)
Spoke with [facility] [phone] from [facility]. She indicated it was approved by her to speak with Maurine Minister [phone] from the community care home that takes care of the patient about Covid-19 results.

## 2018-09-01 NOTE — Progress Notes (Signed)
Spoke with surgeon about Low BP after schedule meds given 81/42 (MAP 54). Holding BP meds for now and reevaluating later if BP still low fluid bolus may be required.

## 2018-09-01 NOTE — Progress Notes (Signed)
In reviewing patient chart there was conflicting documentation regarding Flu results. Flu test was completed here and negative, [facility] notes were recovered from ED and results were Flu B positive. I then notified Dr Aleen Campi of conflicting results, in the best interest of the patient, error on the side of caution and treat for the Flu, orders taken. Isolation continued.

## 2018-09-01 NOTE — Progress Notes (Addendum)
noon vitals obtained BP 96/46 (MAP 62), HR 66, oxygen 95%, Temp 98.1, resp 18. Diastolic reading still low.   Contrast give via NG tube.

## 2018-09-02 ENCOUNTER — Inpatient Hospital Stay: Payer: Medicare Other

## 2018-09-02 LAB — BASIC METABOLIC PANEL
Anion gap: 5 (ref 5–15)
BUN: 15 mg/dL (ref 8–23)
CO2: 31 mmol/L (ref 22–32)
Calcium: 8.3 mg/dL — ABNORMAL LOW (ref 8.9–10.3)
Chloride: 103 mmol/L (ref 98–111)
Creatinine, Ser: 0.84 mg/dL (ref 0.61–1.24)
GFR calc Af Amer: >60 mL/min (ref >60)
GFR calc non Af Amer: >60 mL/min (ref >60)
Glucose, Bld: 116 mg/dL — ABNORMAL HIGH (ref 70–99)
Potassium: 3.7 mmol/L (ref 3.5–5.1)
Sodium: 139 mmol/L (ref 135–145)

## 2018-09-02 LAB — MRSA PCR SCREENING: MRSA by PCR: NEGATIVE

## 2018-09-02 LAB — MAGNESIUM: Magnesium: 1.9 mg/dL (ref 1.7–2.4)

## 2018-09-02 MED ORDER — ACETAMINOPHEN 500 MG PO TABS: 1000.0000 mg | ORAL_TABLET | Freq: Four times a day (QID) | ORAL | Status: DC | PRN

## 2018-09-02 MED ORDER — MUPIROCIN 2 % EX OINT
1.0000 "application " | TOPICAL_OINTMENT | Freq: Two times a day (BID) | CUTANEOUS | Status: DC
  Filled 2018-09-02: qty 22

## 2018-09-02 MED ORDER — CHLORHEXIDINE GLUCONATE CLOTH 2 % EX PADS
6.0000 | MEDICATED_PAD | Freq: Every day | CUTANEOUS | Status: DC
  Administered 2018-09-02: 6 via TOPICAL

## 2018-09-02 MED ORDER — MOMETASONE FURO-FORMOTEROL FUM 100-5 MCG/ACT IN AERO
2.0000 | INHALATION_SPRAY | Freq: Two times a day (BID) | RESPIRATORY_TRACT | Status: DC
  Administered 2018-09-02 – 2018-09-04 (×4): 2 via RESPIRATORY_TRACT
  Filled 2018-09-02: qty 8.8
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
## 2018-09-02 NOTE — Progress Notes (Signed)
Nasogastric tube removed, per MD. Will continue to monitor.

## 2018-09-03 DIAGNOSIS — K56609 Unspecified intestinal obstruction, unspecified as to partial versus complete obstruction: Secondary | ICD-10-CM

## 2018-09-03 MED ORDER — ENSURE ENLIVE PO LIQD
237.0000 mL | Freq: Two times a day (BID) | ORAL | Status: DC
  Administered 2018-09-03 – 2018-09-04 (×4): 237 mL via ORAL
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
## 2018-09-03 NOTE — Care Management (Signed)
RNCM spoke with Rodena Medin Program Manager at DSS.  He was unable to provide me with the name of the group home which the patient resides.  He has left a message for the [employer] to call this RNCM.  RNCM also left a voicemail for [facility].  Awaiting return call  RNCM attempted to contact  Number listed for group home and it was not a working number  Patient with history of dementia, and is on isolation precautions.  Due to limitations related to covid RNCM did not enter room.

## 2018-09-03 NOTE — Progress Notes (Signed)
PT Cancellation Note  Patient Details Name: Cameron Ford MRN: [id number] DOB: 1945/11/30   Cancelled Treatment:    Reason Eval/Treat Not Completed: Other (comment) Chart reviewed. Nursing consulted. Patient on bedpan on PT arrival. Patient requesting PT return after patient has finished on bedpan and hygiene/bathing performed. PT will attempt at a later time/date.  Sheria Lang PT, DPT [phone] 09/03/2018, 2:38 PM
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
## 2018-09-04 NOTE — Discharge Summary (Signed)
Physician Discharge Summary  Patient ID: Cameron Ford MRN: [id number] DOB/AGE: 1945-08-10 73 y.o.  Admit date: 08/31/2018 Discharge date: 09/04/2018  Admission Diagnoses: SBO  Discharge Diagnoses:  Active Problems:   SBO (small bowel obstruction) [facility])   Discharged Condition: fair  Hospital Course: Mr. Erney presented to the emergency department at [facility] with symptoms and imaging consistent with a small bowel obstruction, likely secondary to prior abdominal surgery.  He was treated with nasogastric decompression and IV hydration.  He began having bowel function and tolerated advancement of his diet.  He was deemed stable for discharge to his prior facility.  Consults: None  Significant Diagnostic Studies: radiology: EXAM: CT ABDOMEN AND PELVIS WITH CONTRAST  TECHNIQUE: Multidetector CT imaging of the abdomen and pelvis was performed using the standard protocol following bolus administration of intravenous contrast.  CONTRAST:  OMNIPAQUE IOHEXOL 300 MG/ML  SOLN  COMPARISON:  CT 02/11/2016  FINDINGS: Lower chest: Lung bases are clear. Calcified/metallic density in the LEFT lower lobe unchanged  Hepatobiliary: Mild intra and extrahepatic biliary biliary duct dilatation following cholecystectomy. Findings similar to comparison exam  Pancreas: Pancreas is normal. No ductal dilatation. No pancreatic inflammation.  Spleen: Post splenectomy.  Adrenals/urinary tract: Adrenal glands normal. Benign cysts of the LEFT kidney. No hydronephrosis. Ureters and bladder normal.  Stomach/Bowel: Stomach is fluid-filled. Fluid in the distal esophagus. Duodenum is normal. There is dilated loops of proximal small bowel up to 4.2 cm. long segment of dilated small bowel to the mid small bowel. There is a bowel caliber change in the mid abdomen. Potential site of obstruction is along the ventral peritoneal surface suggesting adhesions at this level. Transition best seen  on coronal image 18/5 where there is caliber change from 3.6 cm down to 0.7 cm. The more distal small bowel is collapsed up to the terminal ileum.  No pneumatosis or portal venous gas.  There is stool throughout the proximal colon. The distal colon is decompressed.  Vascular/Lymphatic: Abdominal aorta is normal caliber with atherosclerotic calcification. There is no retroperitoneal or periportal lymphadenopathy. No pelvic lymphadenopathy.  Reproductive: Prostate normal  Other: No free fluid.  Musculoskeletal: No aggressive osseous lesion.  IMPRESSION: 1. Small-bowel obstruction with transition point in the mid small bowel. Transition occurs along the ventral peritoneal surface and likely secondary to adhesions. 2. No portal venous gas or pneumatosis. 3.  Aortic Atherosclerosis (ICD10-I70.0).    Treatments: IV hydration and NGT decompression  Discharge Exam: Blood pressure (!) 115/58, pulse 88, temperature 97.9 F (36.6 C), temperature source Oral, resp. rate 16, height 5\' 7"  (1.702 m), weight 79.1 kg, SpO2 (!) 89 %. General appearance: alert, cooperative and no distress Resp: clear to auscultation bilaterally Cardio: regular rate and rhythm GI: soft, non-tender; bowel sounds normal; no masses,  no organomegaly  Disposition: Discharge disposition: 03-Skilled Nursing Facility       Discharge Instructions    Call MD for:  difficulty breathing, headache or visual disturbances   Complete by:  As directed    Call MD for:  persistant nausea and vomiting   Complete by:  As directed    Call MD for:  severe uncontrolled pain   Complete by:  As directed    Diet - low sodium heart healthy   Complete by:  As directed    Increase activity slowly   Complete by:  As directed         Signed: Duanne Guess 09/04/2018, 2:06 PM
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
## 2018-09-04 NOTE — NC FL2 (Signed)
[place] MEDICAID FL2 LEVEL OF CARE SCREENING TOOL     IDENTIFICATION  Patient Name: Cameron Ford Birthdate: [birth year] Sex: male Admission Date (Current Location): 08/31/2018  [place] and [place] Number:  Chiropodist and Address:  [facility], [address], [place], [place] [address]      Provider Number: [id number]  Attending Physician Name and Address:  Henrene Dodge, MD  Relative Name and Phone Number:       Current Level of Care: Hospital Recommended Level of Care: [facility] Prior Approval Number:    Date Approved/Denied:   PASRR Number:    Discharge Plan: Other (Comment)(group home)    Current Diagnoses: Patient Active Problem List   Diagnosis Date Noted  . SBO (small bowel obstruction) (HCC) 08/31/2018  . Cellulitis 08/17/2016  . Chronic constipation 02/02/2016  . HLD (hyperlipidemia) 02/02/2016  . BP (high blood pressure) 02/02/2016  . Has a tremor 02/02/2016  . GI bleed 03/12/2015    Orientation RESPIRATION BLADDER Height & Weight     Self, Place  Normal Continent Weight: 79.1 kg Height:  5\' 7"  (170.2 cm)  BEHAVIORAL SYMPTOMS/MOOD NEUROLOGICAL BOWEL NUTRITION STATUS      Incontinent Diet(regular)  AMBULATORY STATUS COMMUNICATION OF NEEDS Skin   Supervision Verbally Normal                       Personal Care Assistance Level of Assistance  Bathing, Dressing Bathing Assistance: Limited assistance   Dressing Assistance: Limited assistance     Functional Limitations Info             SPECIAL CARE FACTORS FREQUENCY                       Contractures Contractures Info: Not present    Additional Factors Info  Code Status Code Status Info: full             Medication List    TAKE these medications   acetaminophen 325 MG tablet Commonly known as:  TYLENOL Take 650 mg by mouth 2 (two) times daily.   albuterol 108 (90 Base) MCG/ACT inhaler Commonly known as:  VENTOLIN  HFA Inhale 2 puffs into the lungs every 6 (six) hours as needed for wheezing or shortness of breath.   aspirin 81 MG tablet Take 81 mg by mouth daily.   atorvastatin 20 MG tablet Commonly known as:  LIPITOR Take 20 mg by mouth daily.   budesonide-formoterol 80-4.5 MCG/ACT inhaler Commonly known as:  Symbicort Inhale 2 puffs into the lungs 2 (two) times daily.   carbamazepine 200 MG tablet Commonly known as:  TEGRETOL Take 200 mg by mouth 2 (two) times daily.   doxycycline 100 MG tablet Commonly known as:  VIBRA-TABS Take 100 mg by mouth 2 (two) times daily.   gentamicin ointment 0.1 % Commonly known as:  GARAMYCIN Apply topically 2 (two) times daily.   lisinopril 10 MG tablet Commonly known as:  ZESTRIL Take 10 mg by mouth daily.   lithium carbonate 300 MG capsule Take 300 mg by mouth 2 (two) times daily with a meal.   lubiprostone 8 MCG capsule Commonly known as:  AMITIZA Take 8 mcg by mouth 2 (two) times daily with a meal.   polyethylene glycol 17 g packet Commonly known as:  MIRALAX / GLYCOLAX Take 17 g by mouth 2 (two) times daily.   propranolol 40 MG tablet Commonly known as:  INDERAL Take  40 mg by mouth 3 (three) times daily.   risperiDONE 1 MG tablet Commonly known as:  RISPERDAL Take 1 mg by mouth every 8 (eight) hours.   risperiDONE microspheres 25 MG injection Commonly known as:  RISPERDAL CONSTA Inject 25 mg into the muscle every 30 (thirty) days.   silver sulfADIAZINE 1 % cream Commonly known as:  SILVADENE Apply 1 application topically daily.   Discharge Medications: Please see discharge summary for a list of discharge medications.  Relevant Imaging Results:  Relevant Lab Results:   Additional Information    Berlin Viereck, Julio AlmSTEPHANIE T, RN

## 2018-09-04 NOTE — TOC Transition Note (Signed)
Transition of Care [facility]) - CM/SW Discharge Note   Patient Details  Name: Cameron Ford MRN: [id number] Date of Birth: [birth year]  Transition of Care [facility]) CM/SW Contact:  Chapman Fitch, RN Phone Number: 09/04/2018, 3:09 PM   Clinical Narrative:     Contact as follows: [facility] [address] [place], [place] [address] Cari Caraway (administrator) [phone]  Ronnald Collum [phone]  Per Maurine Minister they will accept patient back at discharge and will provide transportation today.  PT has assessed patient and recommends home health.  Maurine Minister declines and states that patient is at his baseline, and gets around well at the facility using a push scooter. RNCM notified Guardian Jewel of discharge.  She is in agreement with Maurine Minister not to purse home health at time of discharge.   Patient to discharge today. FL2 completed and placed in discharge packet.   Final next level of care: Group Home Barriers to Discharge: Barriers Resolved   Patient Goals and CMS Choice        Discharge Placement                       Discharge Plan and Services   Discharge Planning Services: CM Consult                                 Social Determinants of Health (SDOH) Interventions     Readmission Risk Interventions No flowsheet data found.

## 2018-09-04 NOTE — Progress Notes (Signed)
Cameron Ford to be D/C'd [facility] per MD order. Discussed prescriptions and follow up appointments with the patient. Prescriptions given to patient, medication list explained in detail. Pt verbalized understanding.  Allergies as of 09/04/2018   No Known Allergies     Medication List    TAKE these medications   acetaminophen 325 MG tablet Commonly known as:  TYLENOL Take 650 mg by mouth 2 (two) times daily.   albuterol 108 (90 Base) MCG/ACT inhaler Commonly known as:  VENTOLIN HFA Inhale 2 puffs into the lungs every 6 (six) hours as needed for wheezing or shortness of breath.   aspirin 81 MG tablet Take 81 mg by mouth daily.   atorvastatin 20 MG tablet Commonly known as:  LIPITOR Take 20 mg by mouth daily.   budesonide-formoterol 80-4.5 MCG/ACT inhaler Commonly known as:  Symbicort Inhale 2 puffs into the lungs 2 (two) times daily.   carbamazepine 200 MG tablet Commonly known as:  TEGRETOL Take 200 mg by mouth 2 (two) times daily.   doxycycline 100 MG tablet Commonly known as:  VIBRA-TABS Take 100 mg by mouth 2 (two) times daily.   gentamicin ointment 0.1 % Commonly known as:  GARAMYCIN Apply topically 2 (two) times daily.   lisinopril 10 MG tablet Commonly known as:  ZESTRIL Take 10 mg by mouth daily.   lithium carbonate 300 MG capsule Take 300 mg by mouth 2 (two) times daily with a meal.   lubiprostone 8 MCG capsule Commonly known as:  AMITIZA Take 8 mcg by mouth 2 (two) times daily with a meal.   polyethylene glycol 17 g packet Commonly known as:  MIRALAX / GLYCOLAX Take 17 g by mouth 2 (two) times daily.   propranolol 40 MG tablet Commonly known as:  INDERAL Take 40 mg by mouth 3 (three) times daily.   risperiDONE 1 MG tablet Commonly known as:  RISPERDAL Take 1 mg by mouth every 8 (eight) hours.   risperiDONE microspheres 25 MG injection Commonly known as:  RISPERDAL CONSTA Inject 25 mg into the muscle every 30 (thirty) days.   silver  sulfADIAZINE 1 % cream Commonly known as:  SILVADENE Apply 1 application topically daily.       Vitals:   09/04/18 1248 09/04/18 1412  BP: (!) 115/58   Pulse: 88   Resp: 16   Temp: 97.9 F (36.6 C)   SpO2: (!) 89% 91%    Skin clean, dry and intact without evidence of skin break down, no evidence of skin tears noted. IV catheter discontinued intact. Site without signs and symptoms of complications. Dressing and pressure applied. Pt denies pain at this time. No complaints noted.  An After Visit Summary was printed and given to the patient. Patient escorted via WC, and D/C to [facility] via staff from group home.  Cecil Cobbs RN [facility] [address] Phone [phone]

## 2018-09-04 NOTE — Progress Notes (Signed)
PT Cancellation Note  Patient Details Name: Cameron Ford MRN: [id number] DOB: 1945-06-08   Cancelled Treatment:    Reason Eval/Treat Not Completed: Patient declined, no reason specified Chart reviewed. Nursing consulted. Patient supine in bed on PT arrival. Patient declines to participate in therapy. PT discussed benefits of participation with patient. Patient declined again. PT recommended following-up with patient after lunch if possible, and patient hesitantly agreed to the premise with a "I guess. I just don't know." PT will follow-up at a later time/date.  Sheria Lang PT, DPT [phone] 09/04/2018, 11:47 AM

## 2019-03-05 IMAGING — US US EXTREM LOW VENOUS*L*
1 series · 13 of 24 positions shown · non-contrast
Comparison: None.

CLINICAL DATA: Left lower extremity pain



[Series 1: us extrem low venous*left* · 0.08mm/px · 13 of 33 slices shown]
[im 1/33]
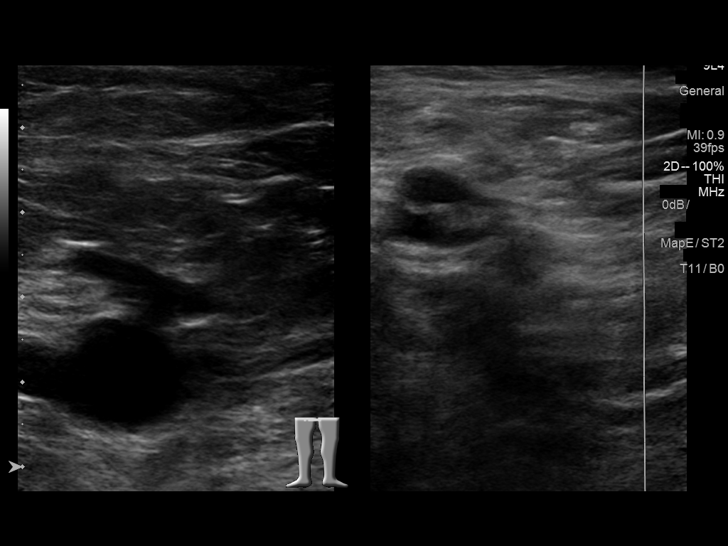
[im 3/33]
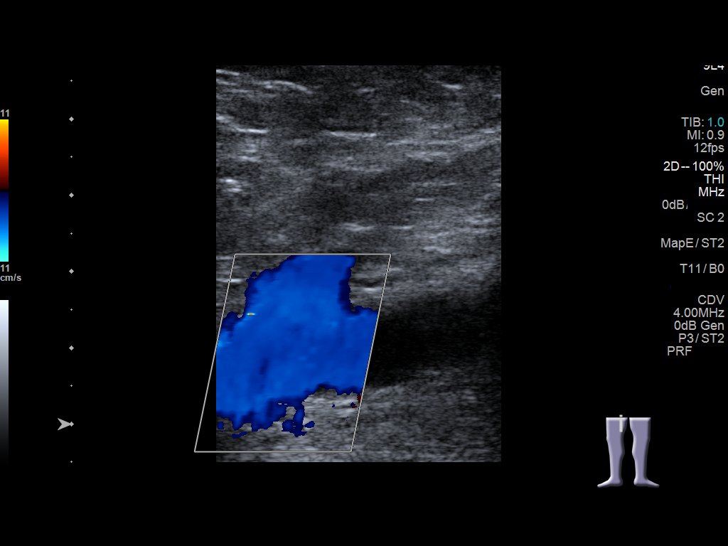
[im 6/33]
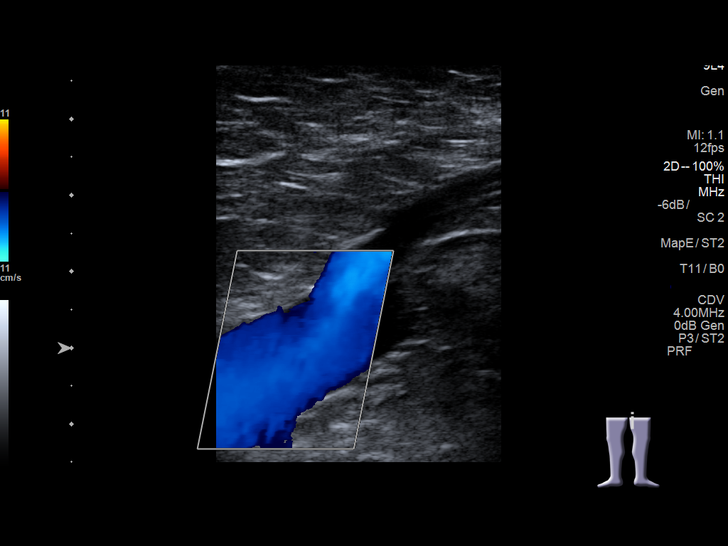
[im 9/33]
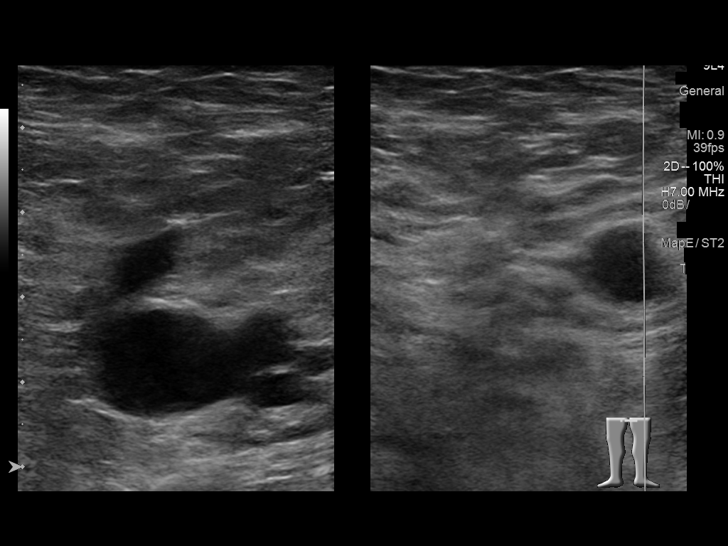
[im 12/33]
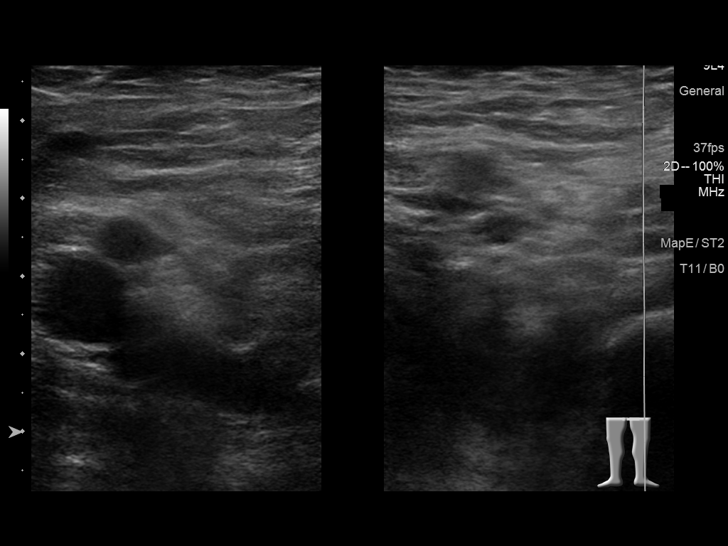
[im 14/33]
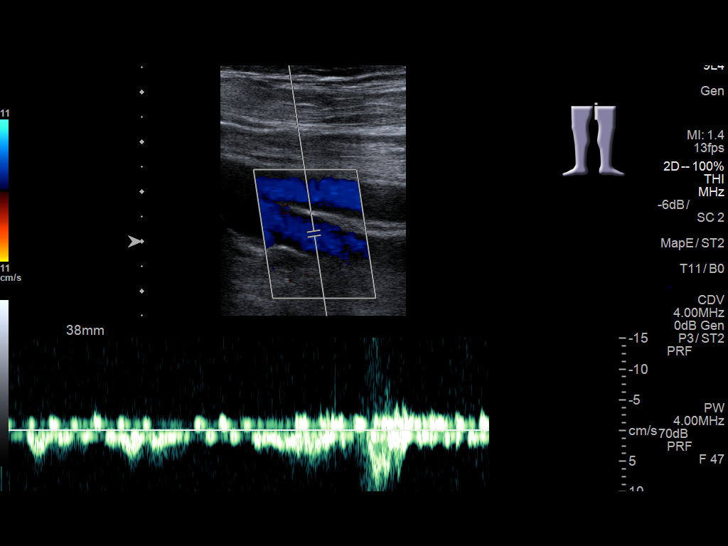
[im 17/33]
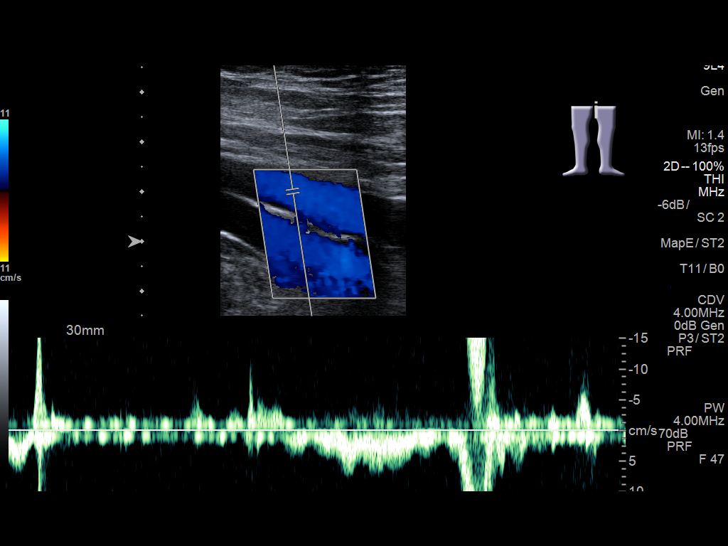
[im 19/33]
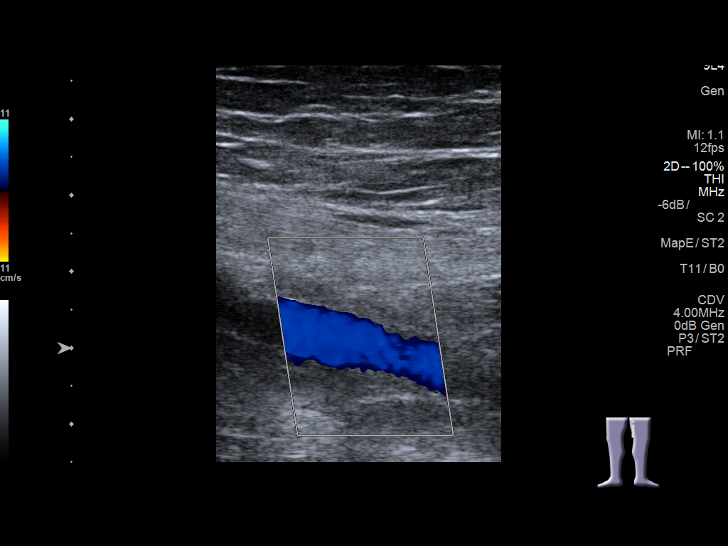
[im 21/33]
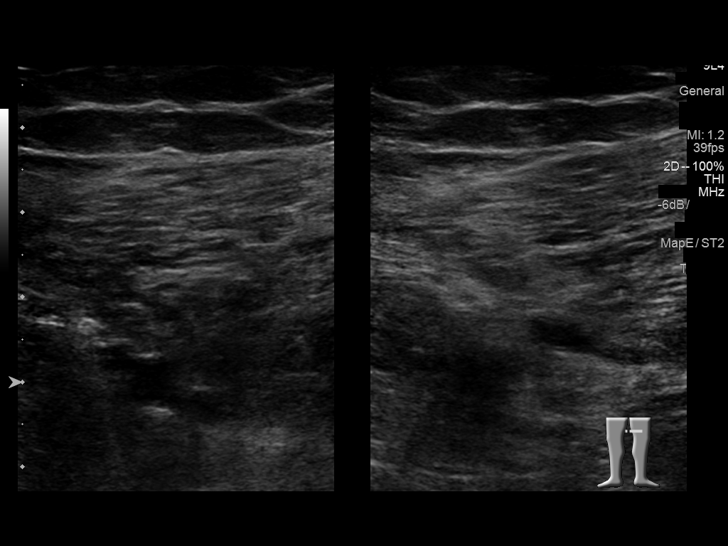
[im 24/33]
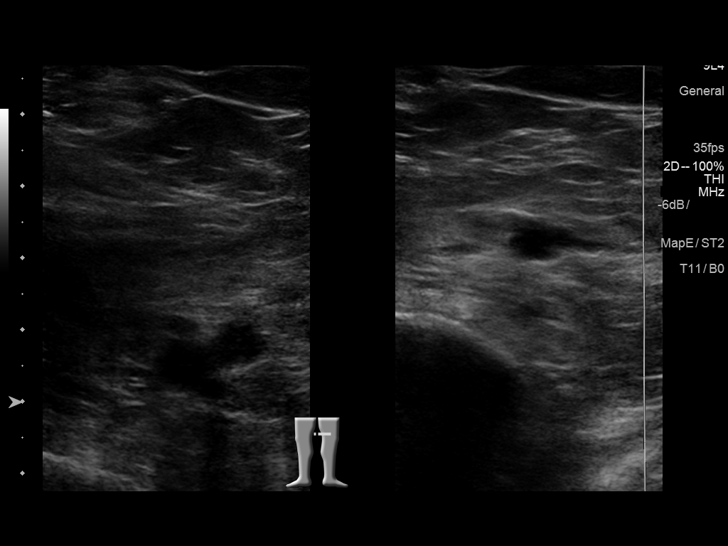
[im 27/33]
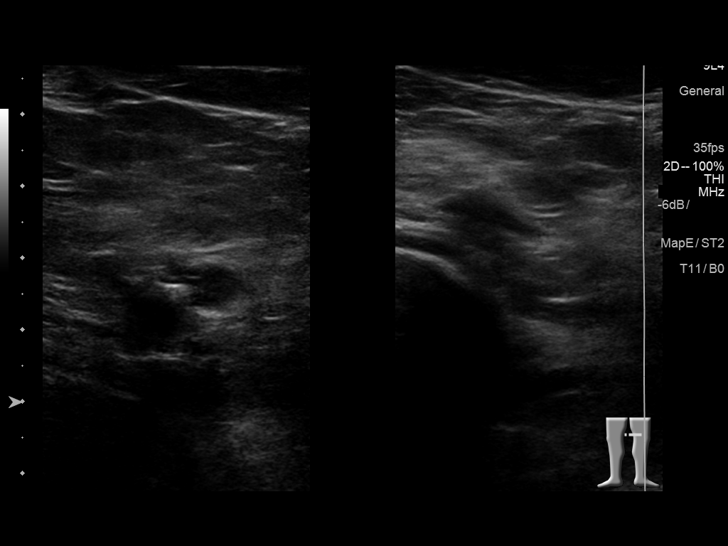
[im 30/33]
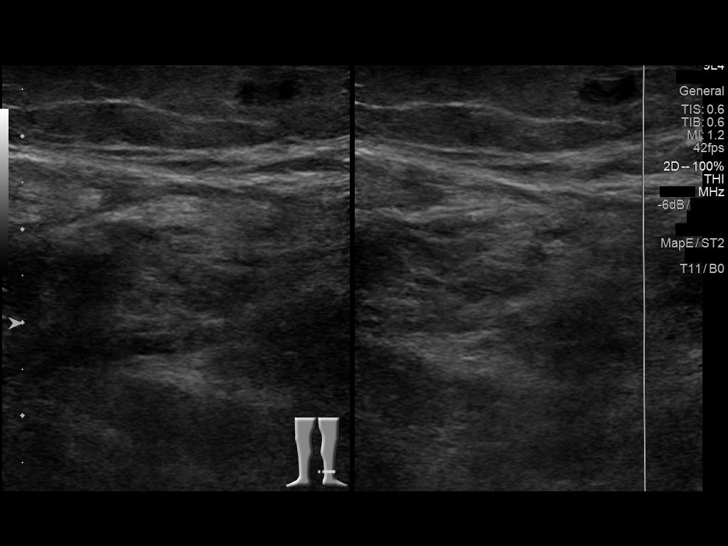
[im 33/33]
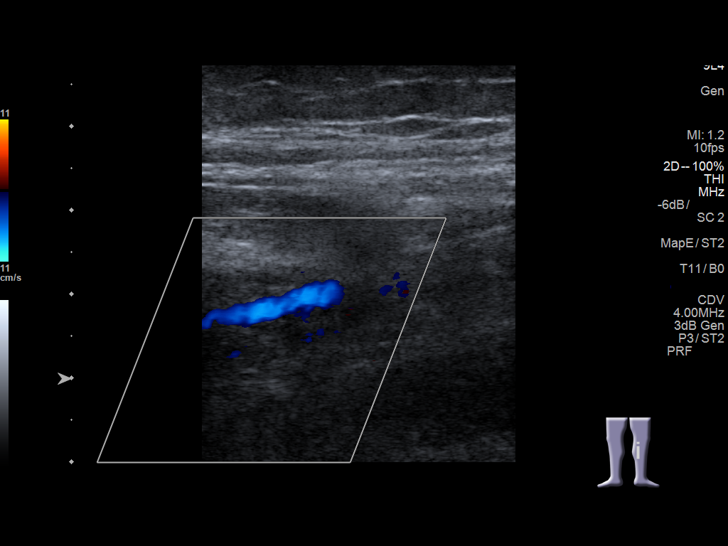

[13 of 24 positions shown; findings below may reference images not displayed]

FINDINGS: Contralateral Common Femoral Vein: Respiratory phasicity is normal
and symmetric with the symptomatic side. No evidence of thrombus.
Normal compressibility.

Common Femoral Vein: No evidence of thrombus.

Saphenofemoral Junction: No evidence of thrombus.

Profunda Femoral Vein: No evidence of thrombus.

Femoral Vein: No evidence of thrombus.

Popliteal Vein: No evidence of thrombus.

Calf Veins: No evidence of thrombus.
IMPRESSION: No evidence of DVT within the left lower extremity.

## 2019-04-09 ENCOUNTER — Emergency Department
Admission: EM | Admit: 2019-04-09 | Discharge: 2019-04-09 | Disposition: A | Discharge status: Home / Self Care | Payer: Medicare Other | Attending: Emergency Medicine | Admitting: Emergency Medicine

## 2019-04-09 ENCOUNTER — Other Ambulatory Visit: Payer: Self-pay

## 2019-04-09 ENCOUNTER — Emergency Department: Payer: Medicare Other

## 2019-04-09 ENCOUNTER — Encounter: Payer: Self-pay | Admitting: Emergency Medicine

## 2019-04-09 DIAGNOSIS — W19XXXA Unspecified fall, initial encounter: Secondary | ICD-10-CM | POA: Diagnosis not present

## 2019-04-09 DIAGNOSIS — M25512 Pain in left shoulder: Secondary | ICD-10-CM | POA: Diagnosis not present

## 2019-04-09 DIAGNOSIS — I10 Essential (primary) hypertension: Secondary | ICD-10-CM | POA: Diagnosis not present

## 2019-04-09 DIAGNOSIS — Y939 Activity, unspecified: Secondary | ICD-10-CM | POA: Diagnosis not present

## 2019-04-09 DIAGNOSIS — S80212A Abrasion, left knee, initial encounter: Secondary | ICD-10-CM

## 2019-04-09 DIAGNOSIS — Y999 Unspecified external cause status: Secondary | ICD-10-CM | POA: Insufficient documentation

## 2019-04-09 DIAGNOSIS — Z8673 Personal history of transient ischemic attack (TIA), and cerebral infarction without residual deficits: Secondary | ICD-10-CM | POA: Diagnosis not present

## 2019-04-09 DIAGNOSIS — Z79899 Other long term (current) drug therapy: Secondary | ICD-10-CM | POA: Diagnosis not present

## 2019-04-09 DIAGNOSIS — M25551 Pain in right hip: Secondary | ICD-10-CM | POA: Diagnosis not present

## 2019-04-09 DIAGNOSIS — M79609 Pain in unspecified limb: Secondary | ICD-10-CM

## 2019-04-09 DIAGNOSIS — Y929 Unspecified place or not applicable: Secondary | ICD-10-CM | POA: Insufficient documentation

## 2019-04-09 DIAGNOSIS — S0990XA Unspecified injury of head, initial encounter: Secondary | ICD-10-CM | POA: Diagnosis present

## 2019-04-09 DIAGNOSIS — F17210 Nicotine dependence, cigarettes, uncomplicated: Secondary | ICD-10-CM | POA: Insufficient documentation

## 2019-04-09 DIAGNOSIS — Y92009 Unspecified place in unspecified non-institutional (private) residence as the place of occurrence of the external cause: Secondary | ICD-10-CM

## 2019-04-09 DIAGNOSIS — J45909 Unspecified asthma, uncomplicated: Secondary | ICD-10-CM | POA: Insufficient documentation

## 2019-04-09 DIAGNOSIS — Z7982 Long term (current) use of aspirin: Secondary | ICD-10-CM | POA: Diagnosis not present

## 2019-04-09 MED ORDER — TRAMADOL HCL 50 MG PO TABS
50.0000 mg | ORAL_TABLET | Freq: Once | ORAL | Status: AC
  Administered 2019-04-09: 50 mg via ORAL
  Filled 2019-04-09: qty 1

## 2019-04-09 MED ORDER — BACITRACIN-NEOMYCIN-POLYMYXIN 400-5-5000 EX OINT
TOPICAL_OINTMENT | Freq: Once | CUTANEOUS | Status: AC
  Administered 2019-04-09: 2 via TOPICAL
  Filled 2019-04-09: qty 1

## 2019-04-09 NOTE — Discharge Instructions (Signed)
Your CT and x-ray findings were unremarkable acute abnormalities.  Continue previous medicine and follow discharge care instructions.
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
## 2019-04-09 NOTE — ED Triage Notes (Signed)
Presents via EMS s/p fall  Per ems he had a mechanical fall  Hit head  Possible LOC  Also having some bilateral shoulder ,left knee and right hip pain

## 2019-04-15 ENCOUNTER — Encounter: Payer: Self-pay | Admitting: Podiatry

## 2019-04-15 ENCOUNTER — Ambulatory Visit (INDEPENDENT_AMBULATORY_CARE_PROVIDER_SITE_OTHER): Payer: Medicare Other | Admitting: Podiatry

## 2019-04-15 ENCOUNTER — Other Ambulatory Visit: Payer: Self-pay

## 2019-04-15 DIAGNOSIS — M79674 Pain in right toe(s): Secondary | ICD-10-CM | POA: Diagnosis not present

## 2019-04-15 DIAGNOSIS — M79675 Pain in left toe(s): Secondary | ICD-10-CM | POA: Diagnosis not present

## 2019-04-15 DIAGNOSIS — B351 Tinea unguium: Secondary | ICD-10-CM

## 2019-04-15 NOTE — Progress Notes (Signed)
Complaint:  Visit Type: Patient presents  to my office for  preventative foot care services. Complaint: Patient states" my nails have grown long and thick and become painful to walk and wear shoes" . The patient presents for preventative foot care services.  Podiatric Exam: Vascular: dorsalis pedis and posterior tibial pulses are weakly  palpable bilateral. Capillary return is immediate. Temperature gradient is WNL. Skin turgor WNL  Sensorium: Normal Semmes Weinstein monofilament test. Normal tactile sensation bilaterally. Nail Exam: Pt has thick disfigured discolored nails with subungual debris noted bilateral entire nail hallux through fifth toenails Ulcer Exam: There is no evidence of ulcer or pre-ulcerative changes or infection. Orthopedic Exam: Muscle tone and strength are WNL. No limitations in general ROM. No crepitus or effusions noted. Foot type and digits show no abnormalities. Bony prominences are unremarkable. Skin: No Porokeratosis. No infection or ulcers  Diagnosis:  Onychomycosis, , Pain in right toe, pain in left toes  Treatment & Plan Procedures and Treatment: Consent by patient was obtained for treatment procedures.   Debridement of mycotic and hypertrophic toenails, 1 through 5 bilateral and clearing of subungual debris. No ulceration, no infection noted.  Return Visit-Office Procedure: Patient instructed to return to the office for a follow up visit 3 months for continued evaluation and treatment.    Sadie Hazelett DPM 

## 2019-06-27 ENCOUNTER — Other Ambulatory Visit: Payer: Self-pay

## 2019-06-27 ENCOUNTER — Encounter: Payer: Self-pay | Admitting: Podiatry

## 2019-06-27 ENCOUNTER — Ambulatory Visit (INDEPENDENT_AMBULATORY_CARE_PROVIDER_SITE_OTHER): Payer: Medicare Other | Admitting: Podiatry

## 2019-06-27 DIAGNOSIS — M79674 Pain in right toe(s): Secondary | ICD-10-CM

## 2019-06-27 DIAGNOSIS — M79675 Pain in left toe(s): Secondary | ICD-10-CM

## 2019-06-27 DIAGNOSIS — B351 Tinea unguium: Secondary | ICD-10-CM

## 2019-06-27 NOTE — Progress Notes (Signed)
Complaint:  Visit Type: Patient presents  to my office for  preventative foot care services. Complaint: Patient states" my nails have grown long and thick and become painful to walk and wear shoes" . The patient presents for preventative foot care services.  Podiatric Exam: Vascular: dorsalis pedis and posterior tibial pulses are weakly  palpable bilateral. Capillary return is immediate. Temperature gradient is WNL. Skin turgor WNL  Sensorium: Normal Semmes Weinstein monofilament test. Normal tactile sensation bilaterally. Nail Exam: Pt has thick disfigured discolored nails with subungual debris noted bilateral entire nail hallux through fifth toenails Ulcer Exam: There is no evidence of ulcer or pre-ulcerative changes or infection. Orthopedic Exam: Muscle tone and strength are WNL. No limitations in general ROM. No crepitus or effusions noted. Foot type and digits show no abnormalities. Bony prominences are unremarkable. Skin: No Porokeratosis. No infection or ulcers  Diagnosis:  Onychomycosis, , Pain in right toe, pain in left toes  Treatment & Plan Procedures and Treatment: Consent by patient was obtained for treatment procedures.   Debridement of mycotic and hypertrophic toenails, 1 through 5 bilateral and clearing of subungual debris. No ulceration, no infection noted.  Return Visit-Office Procedure: Patient instructed to return to the office for a follow up visit 3 months for continued evaluation and treatment.    Helane Gunther DPM

## 2019-09-26 ENCOUNTER — Other Ambulatory Visit: Payer: Self-pay

## 2019-09-26 ENCOUNTER — Ambulatory Visit (INDEPENDENT_AMBULATORY_CARE_PROVIDER_SITE_OTHER): Payer: Medicare Other | Admitting: Podiatry

## 2019-09-26 ENCOUNTER — Encounter: Payer: Self-pay | Admitting: Podiatry

## 2019-09-26 DIAGNOSIS — M79675 Pain in left toe(s): Secondary | ICD-10-CM

## 2019-09-26 DIAGNOSIS — M79674 Pain in right toe(s): Secondary | ICD-10-CM | POA: Diagnosis not present

## 2019-09-26 DIAGNOSIS — B351 Tinea unguium: Secondary | ICD-10-CM | POA: Diagnosis not present

## 2019-09-26 NOTE — Progress Notes (Signed)
This patient returns to the office for evaluation and treatment of long thick painful nails .  This patient is unable to trim his own nails since the patient cannot reach his feet.  Patient says the nails are painful walking and wearing his shoes.  He returns for preventive foot care services.  General Appearance  Alert, conversant and in no acute stress.  Vascular  Dorsalis pedis and posterior tibial  pulses are weakly  palpable  bilaterally.  Capillary return is within normal limits  bilaterally. Temperature is within normal limits  bilaterally.  Neurologic  Senn-Weinstein monofilament wire test within normal limits  bilaterally. Muscle power within normal limits bilaterally.  Nails Thick disfigured discolored nails with subungual debris  from hallux to fifth toes bilaterally. No evidence of bacterial infection or drainage bilaterally.  Orthopedic  No limitations of motion  feet .  No crepitus or effusions noted.  No bony pathology or digital deformities noted.  Skin  normotropic skin with no porokeratosis noted bilaterally.  No signs of infections or ulcers noted.     Onychomycosis  Pain in toes right foot  Pain in toes left foot  Debridement  of nails  1-5  B/L with a nail nipper.  Nails were then filed using a dremel tool with no incidents.    RTC 3 months   Siddhant Hashemi DPM  

## 2019-11-07 ENCOUNTER — Other Ambulatory Visit: Payer: Self-pay

## 2019-11-07 ENCOUNTER — Encounter: Payer: Self-pay | Admitting: Emergency Medicine

## 2019-11-07 ENCOUNTER — Emergency Department
Admission: EM | Admit: 2019-11-07 | Discharge: 2019-11-07 | Disposition: A | Discharge status: Home / Self Care | Payer: Medicare Other | Attending: Student in an Organized Health Care Education/Training Program | Admitting: Student in an Organized Health Care Education/Training Program

## 2019-11-07 ENCOUNTER — Emergency Department: Payer: Medicare Other

## 2019-11-07 DIAGNOSIS — F0390 Unspecified dementia without behavioral disturbance: Secondary | ICD-10-CM | POA: Insufficient documentation

## 2019-11-07 DIAGNOSIS — F17210 Nicotine dependence, cigarettes, uncomplicated: Secondary | ICD-10-CM | POA: Insufficient documentation

## 2019-11-07 DIAGNOSIS — Y929 Unspecified place or not applicable: Secondary | ICD-10-CM | POA: Insufficient documentation

## 2019-11-07 DIAGNOSIS — S80811A Abrasion, right lower leg, initial encounter: Secondary | ICD-10-CM | POA: Insufficient documentation

## 2019-11-07 DIAGNOSIS — S8991XA Unspecified injury of right lower leg, initial encounter: Secondary | ICD-10-CM | POA: Diagnosis present

## 2019-11-07 DIAGNOSIS — Y999 Unspecified external cause status: Secondary | ICD-10-CM | POA: Diagnosis not present

## 2019-11-07 DIAGNOSIS — J45909 Unspecified asthma, uncomplicated: Secondary | ICD-10-CM | POA: Diagnosis not present

## 2019-11-07 DIAGNOSIS — Z79899 Other long term (current) drug therapy: Secondary | ICD-10-CM | POA: Diagnosis not present

## 2019-11-07 DIAGNOSIS — Z7982 Long term (current) use of aspirin: Secondary | ICD-10-CM | POA: Insufficient documentation

## 2019-11-07 DIAGNOSIS — Z7951 Long term (current) use of inhaled steroids: Secondary | ICD-10-CM | POA: Diagnosis not present

## 2019-11-07 DIAGNOSIS — I10 Essential (primary) hypertension: Secondary | ICD-10-CM | POA: Insufficient documentation

## 2019-11-07 DIAGNOSIS — W19XXXA Unspecified fall, initial encounter: Secondary | ICD-10-CM

## 2019-11-07 DIAGNOSIS — S8001XA Contusion of right knee, initial encounter: Secondary | ICD-10-CM

## 2019-11-07 DIAGNOSIS — Y939 Activity, unspecified: Secondary | ICD-10-CM | POA: Diagnosis not present

## 2019-11-07 DIAGNOSIS — T148XXA Other injury of unspecified body region, initial encounter: Secondary | ICD-10-CM

## 2019-11-07 DIAGNOSIS — W1789XA Other fall from one level to another, initial encounter: Secondary | ICD-10-CM | POA: Insufficient documentation

## 2019-11-07 MED ORDER — BACITRACIN-NEOMYCIN-POLYMYXIN 400-5-5000 EX OINT
TOPICAL_OINTMENT | Freq: Once | CUTANEOUS | Status: AC
  Administered 2019-11-07: 1 via TOPICAL
  Filled 2019-11-07: qty 1
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
## 2019-11-07 NOTE — ED Notes (Addendum)
Pt states he tripped on a rock and "skinned up" his knee that is no longer having pain- pt able to transfer self to bed from wheelchair

## 2019-11-07 NOTE — Discharge Instructions (Addendum)
Follow-up with your regular doctor if not improving in 2 to 3 days.  Return emergency department worsening.  Keep the areas clean and dry as possible.  Wash with mild soap and water daily.
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
## 2019-12-30 ENCOUNTER — Other Ambulatory Visit: Payer: Self-pay

## 2019-12-30 ENCOUNTER — Encounter: Payer: Self-pay | Admitting: Podiatry

## 2019-12-30 ENCOUNTER — Ambulatory Visit (INDEPENDENT_AMBULATORY_CARE_PROVIDER_SITE_OTHER): Payer: Medicare Other | Admitting: Podiatry

## 2019-12-30 DIAGNOSIS — B351 Tinea unguium: Secondary | ICD-10-CM

## 2019-12-30 DIAGNOSIS — M79675 Pain in left toe(s): Secondary | ICD-10-CM

## 2019-12-30 DIAGNOSIS — M79674 Pain in right toe(s): Secondary | ICD-10-CM

## 2019-12-30 NOTE — Progress Notes (Signed)
This patient returns to the office for evaluation and treatment of long thick painful nails .  This patient is unable to trim his own nails since the patient cannot reach his feet.  Patient says the nails are painful walking and wearing his shoes.  He returns for preventive foot care services.  General Appearance  Alert, conversant and in no acute stress.  Vascular  Dorsalis pedis and posterior tibial  pulses are weakly  palpable  bilaterally.  Capillary return is within normal limits  bilaterally. Temperature is within normal limits  bilaterally.  Neurologic  Senn-Weinstein monofilament wire test within normal limits  bilaterally. Muscle power within normal limits bilaterally.  Nails Thick disfigured discolored nails with subungual debris  from hallux to fifth toes bilaterally. No evidence of bacterial infection or drainage bilaterally.  Orthopedic  No limitations of motion  feet .  No crepitus or effusions noted.  No bony pathology or digital deformities noted.  Skin  normotropic skin with no porokeratosis noted bilaterally.  No signs of infections or ulcers noted.     Onychomycosis  Pain in toes right foot  Pain in toes left foot  Debridement  of nails  1-5  B/L with a nail nipper.  Nails were then filed using a dremel tool with no incidents.    RTC 3 months   Catherina Pates DPM  

## 2020-02-22 ENCOUNTER — Encounter: Payer: Self-pay | Admitting: Emergency Medicine

## 2020-02-22 ENCOUNTER — Other Ambulatory Visit: Payer: Self-pay

## 2020-02-22 ENCOUNTER — Emergency Department: Payer: Medicare Other

## 2020-02-22 ENCOUNTER — Emergency Department
Admission: EM | Admit: 2020-02-22 | Discharge: 2020-02-22 | Disposition: A | Discharge status: Home / Self Care | Payer: Medicare Other | Attending: Student in an Organized Health Care Education/Training Program | Admitting: Student in an Organized Health Care Education/Training Program

## 2020-02-22 DIAGNOSIS — W19XXXA Unspecified fall, initial encounter: Secondary | ICD-10-CM

## 2020-02-22 DIAGNOSIS — F0390 Unspecified dementia without behavioral disturbance: Secondary | ICD-10-CM | POA: Insufficient documentation

## 2020-02-22 DIAGNOSIS — Z7982 Long term (current) use of aspirin: Secondary | ICD-10-CM | POA: Insufficient documentation

## 2020-02-22 DIAGNOSIS — W010XXA Fall on same level from slipping, tripping and stumbling without subsequent striking against object, initial encounter: Secondary | ICD-10-CM | POA: Diagnosis not present

## 2020-02-22 DIAGNOSIS — F17200 Nicotine dependence, unspecified, uncomplicated: Secondary | ICD-10-CM | POA: Diagnosis not present

## 2020-02-22 DIAGNOSIS — J45909 Unspecified asthma, uncomplicated: Secondary | ICD-10-CM | POA: Insufficient documentation

## 2020-02-22 DIAGNOSIS — I10 Essential (primary) hypertension: Secondary | ICD-10-CM | POA: Diagnosis not present

## 2020-02-22 DIAGNOSIS — M25512 Pain in left shoulder: Secondary | ICD-10-CM | POA: Insufficient documentation

## 2020-02-22 DIAGNOSIS — Z79899 Other long term (current) drug therapy: Secondary | ICD-10-CM | POA: Diagnosis not present

## 2020-02-22 DIAGNOSIS — M79662 Pain in left lower leg: Secondary | ICD-10-CM | POA: Diagnosis not present

## 2020-02-22 DIAGNOSIS — M79605 Pain in left leg: Secondary | ICD-10-CM

## 2020-02-22 MED ORDER — TRAMADOL HCL 50 MG PO TABS
50.0000 mg | ORAL_TABLET | Freq: Once | ORAL | Status: AC
  Administered 2020-02-22: 50 mg via ORAL
  Filled 2020-02-22: qty 1
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
## 2020-02-22 NOTE — ED Triage Notes (Signed)
Pt to the er for injuries sustained in a fall. Pt states his leg gave out while getting stuff out of a drawer and hit his left shoulder left knee. Pt denies LOC or hitting head.

## 2020-02-22 NOTE — ED Notes (Signed)
Pt ambulated with use of walker.  Pt in wheelchair awaiting discharge papers

## 2020-02-22 NOTE — ED Triage Notes (Signed)
Triage done by Lynn Ito RN

## 2020-03-03 ENCOUNTER — Emergency Department
Admission: EM | Admit: 2020-03-03 | Discharge: 2020-03-03 | Disposition: A | Discharge status: Home / Self Care | Payer: Medicare Other | Attending: Student in an Organized Health Care Education/Training Program | Admitting: Student in an Organized Health Care Education/Training Program

## 2020-03-03 ENCOUNTER — Emergency Department: Payer: Medicare Other

## 2020-03-03 ENCOUNTER — Other Ambulatory Visit: Payer: Self-pay

## 2020-03-03 DIAGNOSIS — R197 Diarrhea, unspecified: Secondary | ICD-10-CM | POA: Insufficient documentation

## 2020-03-03 DIAGNOSIS — Z7982 Long term (current) use of aspirin: Secondary | ICD-10-CM | POA: Insufficient documentation

## 2020-03-03 DIAGNOSIS — J45909 Unspecified asthma, uncomplicated: Secondary | ICD-10-CM | POA: Diagnosis not present

## 2020-03-03 DIAGNOSIS — Z0389 Encounter for observation for other suspected diseases and conditions ruled out: Secondary | ICD-10-CM | POA: Diagnosis not present

## 2020-03-03 DIAGNOSIS — Z79899 Other long term (current) drug therapy: Secondary | ICD-10-CM | POA: Diagnosis not present

## 2020-03-03 DIAGNOSIS — K117 Disturbances of salivary secretion: Secondary | ICD-10-CM | POA: Diagnosis present

## 2020-03-03 DIAGNOSIS — I10 Essential (primary) hypertension: Secondary | ICD-10-CM | POA: Insufficient documentation

## 2020-03-03 DIAGNOSIS — Z711 Person with feared health complaint in whom no diagnosis is made: Secondary | ICD-10-CM

## 2020-03-03 DIAGNOSIS — F0390 Unspecified dementia without behavioral disturbance: Secondary | ICD-10-CM | POA: Insufficient documentation

## 2020-03-03 DIAGNOSIS — F17200 Nicotine dependence, unspecified, uncomplicated: Secondary | ICD-10-CM | POA: Insufficient documentation

## 2020-03-03 DIAGNOSIS — I252 Old myocardial infarction: Secondary | ICD-10-CM | POA: Insufficient documentation

## 2020-03-03 NOTE — Discharge Instructions (Addendum)
Patient has no active drooling throughout his stay in the ED.  Patient able to tolerate eating a sandwich, chips and drinking water.  Patient was alert and talking to staff throughout his stay.  Discussed no acute findings or changes from previous CT taken on taken on 02/22/2020.  Continue previous medication follow-up with PCP.

## 2020-03-03 NOTE — ED Notes (Addendum)
Legal guardian [facility] called with no answer.

## 2020-03-03 NOTE — ED Notes (Signed)
Pt tolerating sandwich with no complications

## 2020-03-03 NOTE — ED Notes (Signed)
Patient verbalizes understanding of discharge instructions. Opportunity for questioning and answers were provided. Armband removed by staff, pt discharged from ED. Wheeled out to lobby, assisted to car, and [facility] staff.

## 2020-03-03 NOTE — ED Triage Notes (Signed)
Pt comes via EMS from group home with c/o drooling. Pt denies any complaints. Pt states he always drools. Pt states he does sometime have diarrhea when he eats chocolate candy.

## 2020-03-03 NOTE — ED Notes (Signed)
Mikael Spray, [facility] director and notified him of pt's d/c. He will arrive to transport pt home around 1430.

## 2020-03-03 NOTE — ED Triage Notes (Signed)
Pt comes into the ED via EMS from group home [address] with c/o drooling , pt reports he this is normal for him but the staff, pt states he ate a bunch of candy last night and has diarrhea.  VSS, 135/66 95%RA, HR 56-60.the patient is a/ox2
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
## 2020-03-03 NOTE — ED Notes (Addendum)
Spoke to [employer], legal guardian. She states that the drooling is unusual for pt.

## 2020-04-06 ENCOUNTER — Ambulatory Visit: Payer: Medicare Other | Admitting: Podiatry

## 2020-05-03 ENCOUNTER — Encounter: Payer: Self-pay | Admitting: Internal Medicine

## 2020-05-03 ENCOUNTER — Emergency Department: Payer: Medicare Other

## 2020-05-03 ENCOUNTER — Inpatient Hospital Stay
Admission: EM | Admit: 2020-05-03 | Discharge: 2020-05-19 | DRG: 689 | Disposition: A | Discharge status: Skilled Nursing Facility | Payer: Medicare Other | Attending: Internal Medicine | Admitting: Internal Medicine

## 2020-05-03 ENCOUNTER — Other Ambulatory Visit: Payer: Self-pay

## 2020-05-03 DIAGNOSIS — K649 Unspecified hemorrhoids: Secondary | ICD-10-CM | POA: Diagnosis present

## 2020-05-03 DIAGNOSIS — R296 Repeated falls: Secondary | ICD-10-CM | POA: Diagnosis present

## 2020-05-03 DIAGNOSIS — Z7189 Other specified counseling: Secondary | ICD-10-CM

## 2020-05-03 DIAGNOSIS — E871 Hypo-osmolality and hyponatremia: Secondary | ICD-10-CM

## 2020-05-03 DIAGNOSIS — Z7951 Long term (current) use of inhaled steroids: Secondary | ICD-10-CM

## 2020-05-03 DIAGNOSIS — Z1623 Resistance to quinolones and fluoroquinolones: Secondary | ICD-10-CM | POA: Diagnosis present

## 2020-05-03 DIAGNOSIS — N390 Urinary tract infection, site not specified: Secondary | ICD-10-CM

## 2020-05-03 DIAGNOSIS — J189 Pneumonia, unspecified organism: Secondary | ICD-10-CM | POA: Diagnosis present

## 2020-05-03 DIAGNOSIS — Z1611 Resistance to penicillins: Secondary | ICD-10-CM | POA: Diagnosis present

## 2020-05-03 DIAGNOSIS — G9341 Metabolic encephalopathy: Secondary | ICD-10-CM | POA: Diagnosis present

## 2020-05-03 DIAGNOSIS — G250 Essential tremor: Secondary | ICD-10-CM | POA: Diagnosis present

## 2020-05-03 DIAGNOSIS — R410 Disorientation, unspecified: Secondary | ICD-10-CM | POA: Diagnosis present

## 2020-05-03 DIAGNOSIS — J9621 Acute and chronic respiratory failure with hypoxia: Secondary | ICD-10-CM | POA: Diagnosis present

## 2020-05-03 DIAGNOSIS — Z20822 Contact with and (suspected) exposure to covid-19: Secondary | ICD-10-CM | POA: Diagnosis present

## 2020-05-03 DIAGNOSIS — J441 Chronic obstructive pulmonary disease with (acute) exacerbation: Secondary | ICD-10-CM

## 2020-05-03 DIAGNOSIS — N3000 Acute cystitis without hematuria: Principal | ICD-10-CM | POA: Diagnosis present

## 2020-05-03 DIAGNOSIS — R531 Weakness: Secondary | ICD-10-CM

## 2020-05-03 DIAGNOSIS — Z9049 Acquired absence of other specified parts of digestive tract: Secondary | ICD-10-CM | POA: Diagnosis not present

## 2020-05-03 DIAGNOSIS — I5031 Acute diastolic (congestive) heart failure: Secondary | ICD-10-CM | POA: Diagnosis not present

## 2020-05-03 DIAGNOSIS — N179 Acute kidney failure, unspecified: Secondary | ICD-10-CM | POA: Diagnosis present

## 2020-05-03 DIAGNOSIS — I2699 Other pulmonary embolism without acute cor pulmonale: Secondary | ICD-10-CM | POA: Diagnosis present

## 2020-05-03 DIAGNOSIS — J4540 Moderate persistent asthma, uncomplicated: Secondary | ICD-10-CM | POA: Diagnosis present

## 2020-05-03 DIAGNOSIS — I5033 Acute on chronic diastolic (congestive) heart failure: Secondary | ICD-10-CM | POA: Diagnosis present

## 2020-05-03 DIAGNOSIS — I10 Essential (primary) hypertension: Secondary | ICD-10-CM

## 2020-05-03 DIAGNOSIS — F0390 Unspecified dementia without behavioral disturbance: Secondary | ICD-10-CM | POA: Diagnosis present

## 2020-05-03 DIAGNOSIS — Z7982 Long term (current) use of aspirin: Secondary | ICD-10-CM

## 2020-05-03 DIAGNOSIS — K5900 Constipation, unspecified: Secondary | ICD-10-CM

## 2020-05-03 DIAGNOSIS — J439 Emphysema, unspecified: Secondary | ICD-10-CM | POA: Diagnosis present

## 2020-05-03 DIAGNOSIS — I110 Hypertensive heart disease with heart failure: Secondary | ICD-10-CM | POA: Diagnosis present

## 2020-05-03 DIAGNOSIS — F319 Bipolar disorder, unspecified: Secondary | ICD-10-CM | POA: Diagnosis present

## 2020-05-03 DIAGNOSIS — Z79899 Other long term (current) drug therapy: Secondary | ICD-10-CM

## 2020-05-03 DIAGNOSIS — E785 Hyperlipidemia, unspecified: Secondary | ICD-10-CM | POA: Diagnosis present

## 2020-05-03 DIAGNOSIS — F05 Delirium due to known physiological condition: Secondary | ICD-10-CM | POA: Diagnosis present

## 2020-05-03 DIAGNOSIS — R059 Cough, unspecified: Secondary | ICD-10-CM

## 2020-05-03 DIAGNOSIS — I2694 Multiple subsegmental pulmonary emboli without acute cor pulmonale: Secondary | ICD-10-CM

## 2020-05-03 DIAGNOSIS — J9601 Acute respiratory failure with hypoxia: Secondary | ICD-10-CM | POA: Diagnosis not present

## 2020-05-03 DIAGNOSIS — Z8673 Personal history of transient ischemic attack (TIA), and cerebral infarction without residual deficits: Secondary | ICD-10-CM

## 2020-05-03 DIAGNOSIS — R0902 Hypoxemia: Secondary | ICD-10-CM

## 2020-05-03 DIAGNOSIS — F17210 Nicotine dependence, cigarettes, uncomplicated: Secondary | ICD-10-CM | POA: Diagnosis present

## 2020-05-03 DIAGNOSIS — B9620 Unspecified Escherichia coli [E. coli] as the cause of diseases classified elsewhere: Secondary | ICD-10-CM | POA: Diagnosis present

## 2020-05-03 DIAGNOSIS — Z515 Encounter for palliative care: Secondary | ICD-10-CM

## 2020-05-03 DIAGNOSIS — R4182 Altered mental status, unspecified: Secondary | ICD-10-CM

## 2020-05-03 LAB — URINALYSIS, COMPLETE (UACMP) WITH MICROSCOPIC
Glucose, UA: NEGATIVE mg/dL
Hgb urine dipstick: NEGATIVE
Ketones, ur: NEGATIVE mg/dL
Nitrite: NEGATIVE
Protein, ur: NEGATIVE mg/dL
Specific Gravity, Urine: 1.023 (ref 1.005–1.030)
WBC, UA: >50 WBC/hpf — ABNORMAL HIGH (ref 0–5)
pH: 5 (ref 5.0–8.0)

## 2020-05-03 LAB — CBC WITH DIFFERENTIAL/PLATELET
Abs Immature Granulocytes: 0.02 10*3/uL (ref 0.00–0.07)
Basophils Absolute: 0.1 10*3/uL (ref 0.0–0.1)
Basophils Relative: 1 %
Eosinophils Absolute: 0.1 10*3/uL (ref 0.0–0.5)
Eosinophils Relative: 1 %
HCT: 32.7 % — ABNORMAL LOW (ref 39.0–52.0)
Hemoglobin: 10.7 g/dL — ABNORMAL LOW (ref 13.0–17.0)
Immature Granulocytes: 0 %
Lymphocytes Relative: 13 %
Lymphs Abs: 0.9 10*3/uL (ref 0.7–4.0)
MCH: 33.5 pg (ref 26.0–34.0)
MCHC: 32.7 g/dL (ref 30.0–36.0)
MCV: 102.5 fL — ABNORMAL HIGH (ref 80.0–100.0)
Monocytes Absolute: 0.9 10*3/uL (ref 0.1–1.0)
Monocytes Relative: 14 %
Neutro Abs: 4.6 10*3/uL (ref 1.7–7.7)
Neutrophils Relative %: 71 %
Platelets: 254 10*3/uL (ref 150–400)
RBC: 3.19 MIL/uL — ABNORMAL LOW (ref 4.22–5.81)
RDW: 14.4 % (ref 11.5–15.5)
WBC: 6.5 10*3/uL (ref 4.0–10.5)
nRBC: 0 % (ref 0.0–0.2)

## 2020-05-03 LAB — COMPREHENSIVE METABOLIC PANEL
ALT: 15 U/L (ref 0–44)
AST: 15 U/L (ref 15–41)
Albumin: 3.6 g/dL (ref 3.5–5.0)
Alkaline Phosphatase: 85 U/L (ref 38–126)
Anion gap: 6 (ref 5–15)
BUN: 26 mg/dL — ABNORMAL HIGH (ref 8–23)
CO2: 30 mmol/L (ref 22–32)
Calcium: 9.2 mg/dL (ref 8.9–10.3)
Chloride: 101 mmol/L (ref 98–111)
Creatinine, Ser: 1.34 mg/dL — ABNORMAL HIGH (ref 0.61–1.24)
GFR, Estimated: 55 mL/min — ABNORMAL LOW (ref >60)
Glucose, Bld: 135 mg/dL — ABNORMAL HIGH (ref 70–99)
Potassium: 4.5 mmol/L (ref 3.5–5.1)
Sodium: 137 mmol/L (ref 135–145)
Total Bilirubin: 0.5 mg/dL (ref 0.3–1.2)
Total Protein: 6.8 g/dL (ref 6.5–8.1)

## 2020-05-03 LAB — MAGNESIUM: Magnesium: 2.1 mg/dL (ref 1.7–2.4)

## 2020-05-03 LAB — CREATININE, URINE, RANDOM: Creatinine, Urine: 67 mg/dL

## 2020-05-03 LAB — HEPARIN LEVEL (UNFRACTIONATED): Heparin Unfractionated: 0.9 IU/mL — ABNORMAL HIGH (ref 0.30–0.70)

## 2020-05-03 LAB — RESP PANEL BY RT-PCR (FLU A&B, COVID) ARPGX2
Influenza A by PCR: NEGATIVE
Influenza B by PCR: NEGATIVE
SARS Coronavirus 2 by RT PCR: NEGATIVE

## 2020-05-03 LAB — TROPONIN I (HIGH SENSITIVITY)
Troponin I (High Sensitivity): 3 ng/L (ref <18)
Troponin I (High Sensitivity): 4 ng/L (ref <18)

## 2020-05-03 LAB — SODIUM, URINE, RANDOM: Sodium, Ur: 138 mmol/L

## 2020-05-03 LAB — LITHIUM LEVEL: Lithium Lvl: 1.33 mmol/L — ABNORMAL HIGH (ref 0.60–1.20)

## 2020-05-03 LAB — BRAIN NATRIURETIC PEPTIDE: B Natriuretic Peptide: 72.8 pg/mL (ref 0.0–100.0)

## 2020-05-03 LAB — PROTIME-INR
INR: 1 (ref 0.8–1.2)
Prothrombin Time: 12.7 seconds (ref 11.4–15.2)

## 2020-05-03 LAB — APTT: aPTT: 28 seconds (ref 24–36)

## 2020-05-03 LAB — PROCALCITONIN: Procalcitonin: <0.1 ng/mL

## 2020-05-03 MED ORDER — SODIUM CHLORIDE 0.9 % IV SOLN
1.0000 g | Freq: Once | INTRAVENOUS | Status: AC
  Administered 2020-05-03: 1 g via INTRAVENOUS
  Filled 2020-05-03: qty 10

## 2020-05-03 MED ORDER — POLYETHYLENE GLYCOL 3350 17 G PO PACK
17.0000 g | PACK | Freq: Two times a day (BID) | ORAL | Status: DC
  Administered 2020-05-04 – 2020-05-07 (×6): 17 g via ORAL
  Filled 2020-05-03 (×6): qty 1

## 2020-05-03 MED ORDER — ACETAMINOPHEN 325 MG PO TABS
650.0000 mg | ORAL_TABLET | Freq: Four times a day (QID) | ORAL | Status: DC | PRN
  Administered 2020-05-04 – 2020-05-18 (×7): 650 mg via ORAL
  Filled 2020-05-03 (×7): qty 2

## 2020-05-03 MED ORDER — SODIUM CHLORIDE 0.9 % IV SOLN: INTRAVENOUS | Status: DC

## 2020-05-03 MED ORDER — CARBAMAZEPINE 200 MG PO TABS
200.0000 mg | ORAL_TABLET | Freq: Two times a day (BID) | ORAL | Status: DC
  Administered 2020-05-03 – 2020-05-19 (×31): 200 mg via ORAL
  Filled 2020-05-03 (×33): qty 1

## 2020-05-03 MED ORDER — LUBIPROSTONE 8 MCG PO CAPS
8.0000 ug | ORAL_CAPSULE | Freq: Two times a day (BID) | ORAL | Status: DC
  Administered 2020-05-04 – 2020-05-19 (×24): 8 ug via ORAL
  Filled 2020-05-03 (×33): qty 1

## 2020-05-03 MED ORDER — CEPHALEXIN 500 MG PO CAPS: 500.0000 mg | ORAL_CAPSULE | Freq: Three times a day (TID) | ORAL | 0 refills | Status: DC

## 2020-05-03 MED ORDER — ACETAMINOPHEN 650 MG RE SUPP: 650.0000 mg | Freq: Four times a day (QID) | RECTAL | Status: DC | PRN

## 2020-05-03 MED ORDER — IOHEXOL 350 MG/ML SOLN
75.0000 mL | Freq: Once | INTRAVENOUS | Status: AC | PRN
  Administered 2020-05-03: 75 mL via INTRAVENOUS

## 2020-05-03 MED ORDER — LITHIUM CARBONATE 150 MG PO CAPS
150.0000 mg | ORAL_CAPSULE | Freq: Two times a day (BID) | ORAL | Status: DC
  Administered 2020-05-04 – 2020-05-05 (×3): 150 mg via ORAL
  Filled 2020-05-03 (×5): qty 1

## 2020-05-03 MED ORDER — MOMETASONE FURO-FORMOTEROL FUM 100-5 MCG/ACT IN AERO
2.0000 | INHALATION_SPRAY | Freq: Two times a day (BID) | RESPIRATORY_TRACT | Status: DC
  Administered 2020-05-03 – 2020-05-19 (×31): 2 via RESPIRATORY_TRACT
  Filled 2020-05-03: qty 8.8

## 2020-05-03 MED ORDER — HEPARIN BOLUS VIA INFUSION
5500.0000 [IU] | Freq: Once | INTRAVENOUS | Status: AC
  Administered 2020-05-03: 5500 [IU] via INTRAVENOUS
  Filled 2020-05-03: qty 5500

## 2020-05-03 MED ORDER — SODIUM CHLORIDE 0.9 % IV SOLN
1.0000 g | INTRAVENOUS | Status: DC
  Administered 2020-05-04 – 2020-05-05 (×2): 1 g via INTRAVENOUS
  Filled 2020-05-03 (×2): qty 10
  Filled 2020-05-03: qty 1

## 2020-05-03 MED ORDER — HEPARIN (PORCINE) 25000 UT/250ML-% IV SOLN
1200.0000 [IU]/h | INTRAVENOUS | Status: DC
  Administered 2020-05-03 – 2020-05-04 (×2): 1350 [IU]/h via INTRAVENOUS
  Filled 2020-05-03 (×2): qty 250

## 2020-05-03 MED ORDER — RISPERIDONE 1 MG PO TABS
1.0000 mg | ORAL_TABLET | Freq: Three times a day (TID) | ORAL | Status: DC
  Administered 2020-05-03 – 2020-05-19 (×45): 1 mg via ORAL
  Filled 2020-05-03 (×50): qty 1

## 2020-05-03 MED ORDER — ALBUTEROL SULFATE HFA 108 (90 BASE) MCG/ACT IN AERS
2.0000 | INHALATION_SPRAY | Freq: Once | RESPIRATORY_TRACT | Status: AC
  Administered 2020-05-03: 2 via RESPIRATORY_TRACT
  Filled 2020-05-03: qty 6.7

## 2020-05-03 MED ORDER — ALBUTEROL SULFATE (2.5 MG/3ML) 0.083% IN NEBU: 2.5000 mg | INHALATION_SOLUTION | RESPIRATORY_TRACT | Status: DC | PRN

## 2020-05-03 NOTE — Progress Notes (Signed)
Brief note regarding plan, with full H&P to follow:  75 year old male with history of dementia, who is admitted for acute hypoxic respiratory distress in the setting of acute pulmonary embolism after presenting to [facility] emergency department for evaluation of confusion superimposed on dementia.  On heparin drip.  No evidence of associated hypotension.      Newton Pigg, DO Hospitalist

## 2020-05-03 NOTE — ED Notes (Signed)
Attempted to call legal guardian at this time. No answer, will attempt again.

## 2020-05-03 NOTE — Progress Notes (Signed)
ANTICOAGULATION CONSULT NOTE - Initial Consult  Pharmacy Consult for Heparin Drip Indication: pulmonary embolus  No Known Allergies  Patient Measurements: Height: 5\' 10"  (177.8 cm) Weight: 79.4 kg (175 lb 0.7 oz) IBW/kg (Calculated) : 73 Heparin Dosing Weight: 79.4 kg  Vital Signs: Temp: 98.5 F (36.9 C) (01/02 1255) BP: 115/48 (01/02 1746) Pulse Rate: 72 (01/02 1746)  Labs: Recent Labs    05/03/20 1416 05/03/20 1810  HGB 10.7*  --   HCT 32.7*  --   PLT 254  --   CREATININE 1.34*  --   TROPONINIHS 3 4    Estimated Creatinine Clearance: 48.4 mL/min (A) (by C-G formula based on SCr of 1.34 mg/dL (H)).   Medical History: Past Medical History:  Diagnosis Date  . Asthma   . Bipolar 1 disorder (HCC)   . Chronic constipation   . Dementia (HCC)   . History of small bowel obstruction   . Hyperlipidemia   . Hypertension   . Microalbuminuria   . Neuromuscular disorder (HCC)    tremors  . Stroke [facility])     Assessment: Pharmacy consulted for Heparin dosing in this 75yo male for PE with right heart strain. Reviewed prior to admission medication, no anticoagulants noted.  Goal of Therapy:  Heparin level 0.3-0.7 units/ml Monitor platelets by anticoagulation protocol: Yes   Plan:  Give 5500 units bolus x 1 Start heparin infusion at 1350 units/hr Check anti-Xa level in 8 hours and daily while on heparin Continue to monitor H&H and platelets  75yo, PharmD, BCPS 05/03/2020 8:08 PM

## 2020-05-03 NOTE — ED Notes (Signed)
Pt to CT at this time.

## 2020-05-03 NOTE — ED Triage Notes (Signed)
Pt arrived via [facility] from a group home with c/o increased AMS over the last few days with repeated falls. Per EMS, pt has been A&Ox4, VSS, CBG 197.   Per EMS, pt has hx of dementia, BPD, and can have combative episodes periodically.

## 2020-05-03 NOTE — ED Provider Notes (Addendum)
I seen care of this patient approximately 1500.  Please see ongoing Fridays note for additional details regarding patient initial evaluation assessment.  In brief patient presented with worsening confusion over the last couple days from group home in setting of known dementia.  Patient is completely nonoriented here which is off baseline per report.  He is hemodynamically stable.  CT head is unremarkable and he has a nonfocal neuro exam.  UA is consistent with possible cystitis.  Patient treated with Rocephin.  While undergoing assessment ED patient was noted to have hypoxia with SPO2 of 87% on room air.  He was placed on 2 L nasal cannula with improvement in 86%.  Chest x-ray obtained is unremarkable for evidence of pneumonia, no thorax, or acute volume overload.  EKG is not clearly suggestive of acute ischemia and troponin of 3 is not suggestive of ACS.  CTA of chest obtained due to concern for possible PE is positive for PE.  Heparin ordered.  I will plan to admit to hospital service for further evaluation and management.   .Critical Care Performed by: Gilles Chiquito, MD Authorized by: Gilles Chiquito, MD   Critical care provider statement:    Critical care time (minutes):  45   Critical care time was exclusive of:  Separately billable procedures and treating other patients   Critical care was necessary to treat or prevent imminent or life-threatening deterioration of the following conditions:  Respiratory failure and circulatory failure   Critical care was time spent personally by me on the following activities:  Discussions with consultants, evaluation of patient's response to treatment, examination of patient, ordering and performing treatments and interventions, ordering and review of laboratory studies, ordering and review of radiographic studies, pulse oximetry, re-evaluation of patient's condition, obtaining history from patient or surrogate and review of old charts   I assumed direction of  critical care for this patient from another provider in my specialty: yes   .1-3 Lead EKG Interpretation Performed by: Gilles Chiquito, MD Authorized by: Gilles Chiquito, MD     Interpretation: normal     ECG rate assessment: normal     Rhythm: sinus rhythm     Ectopy: none     Conduction: normal     Medications  0.9 %  sodium chloride infusion (has no administration in time range)  heparin ADULT infusion 100 units/mL (25000 units/256mL) (1,350 Units/hr Intravenous New Bag/Given 05/03/20 1938)  cefTRIAXone (ROCEPHIN) 1 g in sodium chloride 0.9 % 100 mL IVPB (0 g Intravenous Stopped 05/03/20 1530)  albuterol (VENTOLIN HFA) 108 (90 Base) MCG/ACT inhaler 2 puff (2 puffs Inhalation Given 05/03/20 1939)  iohexol (OMNIPAQUE) 350 MG/ML injection 75 mL (75 mLs Intravenous Contrast Given 05/03/20 1749)  heparin bolus via infusion 5,500 Units (5,500 Units Intravenous Bolus from Bag 05/03/20 1938)     Gilles Chiquito, MD 05/03/20 1854    Gilles Chiquito, MD 05/03/20 2024

## 2020-05-03 NOTE — ED Notes (Signed)
Pt assisted with urinal at this time.  

## 2020-05-03 NOTE — ED Notes (Signed)
Pt assisted with urinal at this time. This RN and Tammy RN cleaned pt and applied clean chux and gown at this time
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
## 2020-05-03 NOTE — ED Notes (Signed)
Took over care of pt. Pt moved to room and placed on cardiac monitor. Pt in NAD at this time. VSS. Awaiting further orders. Will continue to monitor.
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
## 2020-05-04 DIAGNOSIS — G9341 Metabolic encephalopathy: Secondary | ICD-10-CM | POA: Diagnosis not present

## 2020-05-04 DIAGNOSIS — N179 Acute kidney failure, unspecified: Secondary | ICD-10-CM | POA: Diagnosis not present

## 2020-05-04 DIAGNOSIS — I2699 Other pulmonary embolism without acute cor pulmonale: Secondary | ICD-10-CM | POA: Diagnosis not present

## 2020-05-04 DIAGNOSIS — N3000 Acute cystitis without hematuria: Secondary | ICD-10-CM | POA: Diagnosis not present

## 2020-05-04 LAB — COMPREHENSIVE METABOLIC PANEL
ALT: 13 U/L (ref 0–44)
AST: 17 U/L (ref 15–41)
Albumin: 3.7 g/dL (ref 3.5–5.0)
Alkaline Phosphatase: 94 U/L (ref 38–126)
Anion gap: 9 (ref 5–15)
BUN: 19 mg/dL (ref 8–23)
CO2: 27 mmol/L (ref 22–32)
Calcium: 8.9 mg/dL (ref 8.9–10.3)
Chloride: 102 mmol/L (ref 98–111)
Creatinine, Ser: 0.96 mg/dL (ref 0.61–1.24)
GFR, Estimated: >60 mL/min (ref >60)
Glucose, Bld: 106 mg/dL — ABNORMAL HIGH (ref 70–99)
Potassium: 4.4 mmol/L (ref 3.5–5.1)
Sodium: 138 mmol/L (ref 135–145)
Total Bilirubin: 0.6 mg/dL (ref 0.3–1.2)
Total Protein: 6.8 g/dL (ref 6.5–8.1)

## 2020-05-04 LAB — CBC
HCT: 32.6 % — ABNORMAL LOW (ref 39.0–52.0)
Hemoglobin: 10.6 g/dL — ABNORMAL LOW (ref 13.0–17.0)
MCH: 33.4 pg (ref 26.0–34.0)
MCHC: 32.5 g/dL (ref 30.0–36.0)
MCV: 102.8 fL — ABNORMAL HIGH (ref 80.0–100.0)
Platelets: 259 10*3/uL (ref 150–400)
RBC: 3.17 MIL/uL — ABNORMAL LOW (ref 4.22–5.81)
RDW: 14.1 % (ref 11.5–15.5)
WBC: 8.2 10*3/uL (ref 4.0–10.5)
nRBC: 0 % (ref 0.0–0.2)

## 2020-05-04 LAB — BLOOD GAS, VENOUS
Acid-Base Excess: 3.2 mmol/L — ABNORMAL HIGH (ref 0.0–2.0)
Bicarbonate: 30.6 mmol/L — ABNORMAL HIGH (ref 20.0–28.0)
O2 Saturation: 58.4 %
Patient temperature: 37
pCO2, Ven: 58 mmHg (ref 44.0–60.0)
pH, Ven: 7.33 (ref 7.250–7.430)
pO2, Ven: 33 mmHg (ref 32.0–45.0)

## 2020-05-04 LAB — HEPARIN LEVEL (UNFRACTIONATED)
Heparin Unfractionated: 0.7 IU/mL (ref 0.30–0.70)
Heparin Unfractionated: 0.86 IU/mL — ABNORMAL HIGH (ref 0.30–0.70)

## 2020-05-04 LAB — MAGNESIUM: Magnesium: 1.7 mg/dL (ref 1.7–2.4)

## 2020-05-04 LAB — TSH: TSH: 0.727 u[IU]/mL (ref 0.350–4.500)

## 2020-05-04 LAB — PHOSPHORUS: Phosphorus: 3.7 mg/dL (ref 2.5–4.6)

## 2020-05-04 MED ORDER — LACTATED RINGERS IV BOLUS
1000.0000 mL | Freq: Once | INTRAVENOUS | Status: AC
  Administered 2020-05-04: 1000 mL via INTRAVENOUS

## 2020-05-04 MED ORDER — LORATADINE 10 MG PO TABS
10.0000 mg | ORAL_TABLET | Freq: Every day | ORAL | Status: DC
  Administered 2020-05-04 – 2020-05-19 (×16): 10 mg via ORAL
  Filled 2020-05-04 (×16): qty 1

## 2020-05-04 NOTE — Plan of Care (Signed)

## 2020-05-04 NOTE — Plan of Care (Signed)
  Problem: Cardiac: Goal: Ability to achieve and maintain adequate cardiopulmonary perfusion will improve Outcome: Progressing   

## 2020-05-04 NOTE — ED Notes (Signed)
Patient declined repositioning.

## 2020-05-04 NOTE — Progress Notes (Signed)
ANTICOAGULATION CONSULT NOTE  Pharmacy Consult for Heparin Drip Indication: pulmonary embolus  Patient Measurements: Heparin Dosing Weight: 79.4 kg  Labs: Recent Labs    05/03/20 1416 05/03/20 1810 05/03/20 1930 05/03/20 2246 05/04/20 0529 05/04/20 0621  HGB 10.7*  --   --   --  10.6*  --   HCT 32.7*  --   --   --  32.6*  --   PLT 254  --   --   --  259  --   APTT  --   --  28  --   --   --   LABPROT  --   --  12.7  --   --   --   INR  --   --  1.0  --   --   --   HEPARINUNFRC  --   --   --  0.90*  --  0.70  CREATININE 1.34*  --   --   --  0.96  --   TROPONINIHS 3 4  --   --   --   --     Estimated Creatinine Clearance: 67.6 mL/min (by C-G formula based on SCr of 0.96 mg/dL).   Medical History: Past Medical History:  Diagnosis Date  . Asthma   . Bipolar 1 disorder (HCC)   . Chronic constipation   . Dementia (HCC)   . History of small bowel obstruction   . Hyperlipidemia   . Hypertension   . Microalbuminuria   . Neuromuscular disorder (HCC)    tremors  . Stroke [facility])     Assessment: Pharmacy consulted for Heparin dosing in this 75yo male for PE with right heart strain. Reviewed prior to admission medication, no anticoagulants noted.  Goal of Therapy:  Heparin level 0.3-0.7 units/ml Monitor platelets by anticoagulation protocol: Yes   Plan:  --1/3 at 0621 HL = 0.70, therapeutic x 1 at upper limit of therapeutic range. Continue heparin infusion at 1350 units/hr --Will re-check confirmatory HL at 1400 --Daily CBC per protocol; currently stable  Tressie Ellis 05/04/2020 8:06 AM

## 2020-05-04 NOTE — ED Notes (Signed)
Dr. Georgeann Oppenheim at bedside and notified of Pt BP. MD states to just monitor pt at this time and notify him if BP continues to decrease.

## 2020-05-04 NOTE — ED Notes (Signed)
Patient resting in stretcher. Oxygen [place] placed back in patient's nose. VSS, NAD noted.

## 2020-05-04 NOTE — Progress Notes (Signed)
Writer spoke with administrator at group home community care [facility] years, spoke with Maurine Minister [phone]) he called to check on status of patient and provided guardianship information. Patient is ward of the state Cameron Ford is his appointed guardian with [place] [employer] her contact number is [phone]. Guardian was provided fax number to nursing unit to fax guardianship paperwork to be placed in patient's chart.

## 2020-05-04 NOTE — Progress Notes (Signed)
ANTICOAGULATION CONSULT NOTE  Pharmacy Consult for Heparin Drip Indication: pulmonary embolus  Patient Measurements: Heparin Dosing Weight: 79.4 kg  Labs: Recent Labs    05/03/20 1416 05/03/20 1810 05/03/20 1930 05/03/20 2246 05/04/20 0529 05/04/20 0621 05/04/20 1400  HGB 10.7*  --   --   --  10.6*  --   --   HCT 32.7*  --   --   --  32.6*  --   --   PLT 254  --   --   --  259  --   --   APTT  --   --  28  --   --   --   --   LABPROT  --   --  12.7  --   --   --   --   INR  --   --  1.0  --   --   --   --   HEPARINUNFRC  --   --   --  0.90*  --  0.70 0.86*  CREATININE 1.34*  --   --   --  0.96  --   --   TROPONINIHS 3 4  --   --   --   --   --     Estimated Creatinine Clearance: 67.6 mL/min (by C-G formula based on SCr of 0.96 mg/dL).   Medical History: Past Medical History:  Diagnosis Date  . Asthma   . Bipolar 1 disorder (HCC)   . Chronic constipation   . Dementia (HCC)   . History of small bowel obstruction   . Hyperlipidemia   . Hypertension   . Microalbuminuria   . Neuromuscular disorder (HCC)    tremors  . Stroke [facility])     Assessment: Pharmacy consulted for Heparin dosing in this 75yo male for PE with right heart strain. Reviewed prior to admission medication, no anticoagulants noted.  1/3 0621 HL 0.70, therapeutic x 1 1/3 1400 HL 0.86  Goal of Therapy:  Heparin level 0.3-0.7 units/ml Monitor platelets by anticoagulation protocol: Yes   Plan:  --1/3 at 1400 HL = 0.86 is supratherapeutic. Will decrease heparin infusion to 1200 units/hr --Will re-check confirmatory HL in 8 hours. --Daily CBC per protocol; currently stable  Clovia Cuff, PharmD, BCPS 05/04/2020 4:04 PM
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
## 2020-05-04 NOTE — ED Notes (Signed)
RN attempted to retry swallow screen and pt refused stating he could not drink it all. Pt did not attempt a second time.
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
## 2020-05-04 NOTE — ED Notes (Addendum)
RN notified MD of pt current BP. Orders to be placed for bolus by MD

## 2020-05-04 NOTE — ED Notes (Signed)
Patient given apple juice, and graham crackers with peanut butter per request. Patient repositioned to sit up straight while eating. Patient encouraged to take small bites to avoid aspiration.

## 2020-05-04 NOTE — ED Notes (Signed)
Took over care for patient. Patient resting in stretcher. VSS.

## 2020-05-04 NOTE — ED Notes (Signed)
SLT at bedside to evaluate pt. Currently attempting to assist with eating

## 2020-05-05 ENCOUNTER — Inpatient Hospital Stay: Payer: Medicare Other

## 2020-05-05 ENCOUNTER — Inpatient Hospital Stay
Admit: 2020-05-05 | Discharge: 2020-05-05 | Disposition: A | Discharge status: Home / Self Care | Payer: Medicare Other | Attending: Internal Medicine | Admitting: Internal Medicine

## 2020-05-05 DIAGNOSIS — N3000 Acute cystitis without hematuria: Secondary | ICD-10-CM | POA: Diagnosis not present

## 2020-05-05 DIAGNOSIS — I2699 Other pulmonary embolism without acute cor pulmonale: Secondary | ICD-10-CM | POA: Diagnosis not present

## 2020-05-05 DIAGNOSIS — G9341 Metabolic encephalopathy: Secondary | ICD-10-CM | POA: Diagnosis not present

## 2020-05-05 LAB — CBC
HCT: 30.3 % — ABNORMAL LOW (ref 39.0–52.0)
Hemoglobin: 9.9 g/dL — ABNORMAL LOW (ref 13.0–17.0)
MCH: 33.6 pg (ref 26.0–34.0)
MCHC: 32.7 g/dL (ref 30.0–36.0)
MCV: 102.7 fL — ABNORMAL HIGH (ref 80.0–100.0)
Platelets: 246 10*3/uL (ref 150–400)
RBC: 2.95 MIL/uL — ABNORMAL LOW (ref 4.22–5.81)
RDW: 14.6 % (ref 11.5–15.5)
WBC: 7.1 10*3/uL (ref 4.0–10.5)
nRBC: 0 % (ref 0.0–0.2)

## 2020-05-05 LAB — URINE CULTURE: Culture: >=100000 — AB

## 2020-05-05 LAB — HEPARIN LEVEL (UNFRACTIONATED)
Heparin Unfractionated: 0.67 IU/mL (ref 0.30–0.70)
Heparin Unfractionated: 0.76 IU/mL — ABNORMAL HIGH (ref 0.30–0.70)

## 2020-05-05 MED ORDER — FUROSEMIDE 10 MG/ML IJ SOLN
40.0000 mg | Freq: Once | INTRAMUSCULAR | Status: AC
  Administered 2020-05-05: 40 mg via INTRAVENOUS
  Filled 2020-05-05: qty 4

## 2020-05-05 MED ORDER — LITHIUM CARBONATE 150 MG PO CAPS
150.0000 mg | ORAL_CAPSULE | Freq: Two times a day (BID) | ORAL | Status: DC
  Administered 2020-05-06 – 2020-05-19 (×26): 150 mg via ORAL
  Filled 2020-05-05 (×28): qty 1

## 2020-05-05 MED ORDER — APIXABAN 5 MG PO TABS
10.0000 mg | ORAL_TABLET | Freq: Two times a day (BID) | ORAL | Status: AC
  Administered 2020-05-05 – 2020-05-11 (×14): 10 mg via ORAL
  Filled 2020-05-05 (×14): qty 2

## 2020-05-05 MED ORDER — APIXABAN 5 MG PO TABS
5.0000 mg | ORAL_TABLET | Freq: Two times a day (BID) | ORAL | Status: DC
  Administered 2020-05-12 – 2020-05-19 (×15): 5 mg via ORAL
  Filled 2020-05-05 (×16): qty 1

## 2020-05-05 NOTE — Progress Notes (Signed)
  Speech Language Pathology Treatment: Dysphagia  Patient Details Name: Cameron Ford MRN: [id number] DOB: 1945/12/02 Today's Date: 05/05/2020 Time: 2897-9150 SLP Time Calculation (min) (ACUTE ONLY): 20 min  Assessment / Plan / Recommendation Clinical Impression  Skilled treatment session focused on diet management. Pt was free overt s/s of aspiration during session but does continue to exhibit stable lengthy mastication and he requests liquids "in order to swallow." This appears to be a baseline behavior that doesn't change across varying consistencies. He is not able to state why he needs to have "something to drink" to swallow. Given that he oral phase patterns are stable across all consistencies and he does achieve complete oral clearing, he has not had any respiratory decline related to aspiration, recommend pt continue regular diet with thin liquids. Education provided to pt's MD and nurse. At this time, skilled ST intervention is no longer indicated.    HPI HPI: 75 y.o. male with medical history significant for dementia, type I bipolar disorder, benign essential tremors, hypertension, moderate persistent asthma who is admitted to [facility] on 05/03/2020 with acute hypoxic respiratory distress in the setting of acute pulmonary embolism after presenting from group home to [facility] Emergency Department for evaluation of confusion. Pt with no history of dysphagia.      SLP Plan  All goals met       Recommendations  Diet recommendations: Regular;Thin liquid Liquids provided via: Straw Medication Administration: Whole meds with liquid Supervision: Staff to assist with self feeding Compensations: Minimize environmental distractions;Slow rate;Small sips/bites Postural Changes and/or Swallow Maneuvers: Seated upright 90 degrees                Oral Care Recommendations: Oral care BID Follow up Recommendations: None SLP Visit Diagnosis: Dysphagia, oral phase  (R13.11) Plan: All goals met       GO               Arliss Frisina B. Rutherford Nail M.S., CCC-SLP, [place] Office [phone]  Stormy Fabian 05/05/2020, 8:41 PM

## 2020-05-05 NOTE — Progress Notes (Signed)
ANTICOAGULATION CONSULT NOTE  Pharmacy Consult for Heparin Drip Indication: pulmonary embolus  Patient Measurements: Heparin Dosing Weight: 79.4 kg  Labs: Recent Labs    05/03/20 1416 05/03/20 1810 05/03/20 1930 05/03/20 2246 05/04/20 0529 05/04/20 0621 05/04/20 1400 05/04/20 2346  HGB 10.7*  --   --   --  10.6*  --   --   --   HCT 32.7*  --   --   --  32.6*  --   --   --   PLT 254  --   --   --  259  --   --   --   APTT  --   --  28  --   --   --   --   --   LABPROT  --   --  12.7  --   --   --   --   --   INR  --   --  1.0  --   --   --   --   --   HEPARINUNFRC  --   --   --    < >  --  0.70 0.86* 0.67  CREATININE 1.34*  --   --   --  0.96  --   --   --   TROPONINIHS 3 4  --   --   --   --   --   --    < > = values in this interval not displayed.    Estimated Creatinine Clearance: 67.6 mL/min (by C-G formula based on SCr of 0.96 mg/dL).  Medical History: Past Medical History:  Diagnosis Date  . Asthma   . Bipolar 1 disorder (HCC)   . Chronic constipation   . Dementia (HCC)   . History of small bowel obstruction   . Hyperlipidemia   . Hypertension   . Microalbuminuria   . Neuromuscular disorder (HCC)    tremors  . Stroke [facility])     Assessment: Pharmacy consulted for Heparin dosing in this 75yo male for PE with right heart strain. Reviewed prior to admission medication, no anticoagulants noted.  1/3 0621 HL 0.70, therapeutic x 1 1/3 1400 HL 0.86 1/4 2346 HL 0.67, therapeutic x 1  Goal of Therapy:  Heparin level 0.3-0.7 units/ml Monitor platelets by anticoagulation protocol: Yes   Plan:  --1/3 at 2346 HL = 0.67 is therapeutic x 1. Will continue heparin infusion at 1200 units/hr --Will re-check confirmatory HL at 0800 --Daily CBC per protocol; currently stable  Valrie Hart, PharmD Clinical Pharmacist  05/05/2020 12:54 AM

## 2020-05-05 NOTE — Evaluation (Addendum)
Occupational Therapy Evaluation Patient Details Name: Cameron Ford MRN: [id number] DOB: 1945/12/10 Today's Date: 05/05/2020    History of Present Illness Cameron Ford is a 75 y.o. male with medical history significant for dementia, type I bipolar disorder, benign essential tremors, hypertension, moderate persistent asthma who is admitted to [facility] on 05/03/2020 with acute hypoxic respiratory distress in the setting of acute pulmonary embolism after presenting from group home to [facility] Emergency Department for evaluation of confusion   Clinical Impression   Cameron Ford was seen for OT evaluation this date. Per phone call with group home, PTA pt was MOD I for mobility using 4WW and required assist for dressing, bathing, feeding. Pt presents to acute OT demonstrating impaired ADL performance and functional mobility 2/2 decreased activity tolerance and functional strength/ROM deficits. A&O x2 self and location (responds to time as winter given cues). History of dementia, presents for worsening confusion, no caregivers present to determine if near baseline. Pt mutters throughout interaction calling OT "you bitch" and stating "if I get a gun and shoot you in the head you would understand that."  Pt currently requires MAX A self-feeding at bed level - pt refuses most food stating he has already eaten despite full breakfast tray next to him and NT reporting he refused to eat this AM. MAX A don gown bed level. Attempted exiting bed, MAX A for BLE mgmt, attempted to assist c trunk elevation and pt hit OT - mobility trial terminated. Pt able to assist with returning BLE to bed. MAX Ax2 to scoot pt higher in bed. Pt would benefit from skilled OT to address noted impairments and functional limitations (see below for any additional details) in order to maximize safety and independence while minimizing falls risk and caregiver burden. Upon hospital discharge, recommend STR to maximize pt  safety and return to PLOF.     Follow Up Recommendations  SNF;Supervision/Assistance - 24 hour (may improve pending participation in mobility)    Equipment Recommendations  Other (comment) (TBD)    Recommendations for Other Services       Precautions / Restrictions Precautions Precautions: Fall Restrictions Weight Bearing Restrictions: No      Mobility Bed Mobility Overal bed mobility: Needs Assistance Bed Mobility: Supine to Sit;Sit to Supine     Supine to sit: Max assist Sit to supine: Mod assist   General bed mobility comments: Attempted exiting bed, MAX A for BLE mgmt, attempted to assist c trunk elevation and pt hit OT, terminated mobility. Pt able to assist with returning BLE to bed. MAX Ax2 to scoot pt higher in bed    Transfers                 General transfer comment: Not tested        ADL either performed or assessed with clinical judgement   ADL Overall ADL's : Needs assistance/impaired                                       General ADL Comments: MAX A self-feeding at bed level - pt refuses most food stating he has already eaten despite full breakfast tray next to him and NT reporting he refused to eat this AM. MAX A don gown bed level                  Pertinent Vitals/Pain Pain Assessment: No/denies pain  Hand Dominance     Extremity/Trunk Assessment Upper Extremity Assessment Upper Extremity Assessment: RUE deficits/detail;LUE deficits/detail RUE Deficits / Details: Involuntary tremor, can grasp empty cup and place on table. Resists MMT LUE Deficits / Details: tremor, resists MMT   Lower Extremity Assessment Lower Extremity Assessment: Difficult to assess due to impaired cognition (resists MMT, Able to slide legs back across bed)       Communication Communication Communication: Expressive difficulties   Cognition Arousal/Alertness: Awake/alert Behavior During Therapy: Agitated Overall Cognitive Status: No  family/caregiver present to determine baseline cognitive functioning                                 General Comments: History of dementia, presents for worsening confusion, no caregivers present to determine baseline. Pt mutters throughout interaction calling OT "you bitch" and stating "if I get a gun and shoot you in the head you would understand that"   General Comments       Exercises Exercises: Other exercises Other Exercises Other Exercises: Pt educated re: OT role, importance of mobility for functional strengthening Other Exercises: UBD, self-feeding, sup<>sit, bed mobility   Shoulder Instructions      Home Living Family/patient expects to be discharged to:: Group home                                        Prior Functioning/Environment Level of Independence: Needs assistance  Gait / Transfers Assistance Needed: MOD I using Rollator ADL's / Homemaking Assistance Needed: Assist for feeding, dressing, bathing   Comments: Per phone call to pt's group home        OT Problem List: Decreased strength;Decreased range of motion;Decreased activity tolerance;Decreased cognition;Decreased safety awareness      OT Treatment/Interventions: Self-care/ADL training;Therapeutic exercise;Energy conservation;DME and/or AE instruction;Therapeutic activities;Patient/family education;Balance training    OT Goals(Current goals can be found in the care plan section) Acute Rehab OT Goals Patient Stated Goal: To be left alone OT Goal Formulation: With patient Time For Goal Achievement: 05/19/20 Potential to Achieve Goals: Fair ADL Goals Pt Will Perform Eating: with min assist;bed level Pt Will Perform Grooming: with mod assist;sitting Pt Will Transfer to Toilet: with min assist;stand pivot transfer;bedside commode (c LRAD PRN)  OT Frequency: Min 1X/week    AM-PAC OT "6 Clicks" Daily Activity     Outcome Measure Help from another person eating meals?: A  Lot Help from another person taking care of personal grooming?: A Lot Help from another person toileting, which includes using toliet, bedpan, or urinal?: A Lot Help from another person bathing (including washing, rinsing, drying)?: A Lot Help from another person to put on and taking off regular upper body clothing?: A Lot Help from another person to put on and taking off regular lower body clothing?: A Lot 6 Click Score: 12   End of Session Equipment Utilized During Treatment: Oxygen Nurse Communication: Mobility status  Activity Tolerance: Treatment limited secondary to agitation Patient left: in bed;with call bell/phone within reach;with nursing/sitter in room  OT Visit Diagnosis: Other abnormalities of gait and mobility (R26.89);Muscle weakness (generalized) (M62.81)                Time: 0950-1003 OT Time Calculation (min): 13 min Charges:  OT General Charges $OT Visit: 1 Visit OT Evaluation $OT Eval Low Complexity: 1 Low OT Treatments $Self Care/Home Management :  8-22 mins  Cameron Ford, M.S. OTR/L  05/05/20, 10:40 AM  ascom [phone]

## 2020-05-05 NOTE — Progress Notes (Signed)
ANTICOAGULATION CONSULT NOTE  Pharmacy Consult for Apixaban dosing  Indication: pulmonary embolus  Patient Measurements: Heparin Dosing Weight: 79.4 kg  Labs: Recent Labs    05/03/20 1416 05/03/20 1810 05/03/20 1930 05/03/20 2246 05/04/20 0529 05/04/20 0621 05/04/20 1400 05/04/20 2346 05/05/20 0812  HGB 10.7*  --   --   --  10.6*  --   --   --  9.9*  HCT 32.7*  --   --   --  32.6*  --   --   --  30.3*  PLT 254  --   --   --  259  --   --   --  246  APTT  --   --  28  --   --   --   --   --   --   LABPROT  --   --  12.7  --   --   --   --   --   --   INR  --   --  1.0  --   --   --   --   --   --   HEPARINUNFRC  --   --   --    < >  --    < > 0.86* 0.67 0.76*  CREATININE 1.34*  --   --   --  0.96  --   --   --   --   TROPONINIHS 3 4  --   --   --   --   --   --   --    < > = values in this interval not displayed.    Estimated Creatinine Clearance: 67.6 mL/min (by C-G formula based on SCr of 0.96 mg/dL).  Medical History: Past Medical History:  Diagnosis Date  . Asthma   . Bipolar 1 disorder (HCC)   . Chronic constipation   . Dementia (HCC)   . History of small bowel obstruction   . Hyperlipidemia   . Hypertension   . Microalbuminuria   . Neuromuscular disorder (HCC)    tremors  . Stroke [facility])     Assessment: Pharmacy consulted for Heparin dosing in this 75yo male for PE with right heart strain on 1/2. Pharmacy consulted for transition from heparin to oral anticoagulant on 1/4.  Patient is prescribed carbamazepine.  The effects of apixaban/Rivarxoaban may be decreased in patients on a strong CYP3A4 Inducer and P-gp Inducers (carbamazepine) Concomitant use should be avoided. The best [facility] option for this patient would be warfarin, so that INR could be monitored.  MD would like to proceed with Apixaban due to patient living in group home and not sure that INR level monitoring would be done appropriately.   Plan:  Start apixaban 10mg  BID x 7 days, followed by  apixaban 5mg  daily.  Monitor CBC every 3 days per protocol while inpatient.   , PharmD, BCPS Clinical Pharmacist 05/05/2020 10:47 AM
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
## 2020-05-05 NOTE — Discharge Instructions (Signed)
Information on my medicine - ELIQUIS (apixaban)  This medication education was reviewed with me or my healthcare representative as part of my discharge preparation.    Why was Eliquis prescribed for you? Eliquis was prescribed to treat blood clots that may have been found in the veins of your legs (deep vein thrombosis) or in your lungs (pulmonary embolism) and to reduce the risk of them occurring again.  What do You need to know about Eliquis ? The starting dose is 10 mg (two 5 mg tablets) taken TWICE daily for the FIRST SEVEN (7) DAYS, then on 05/13/19  the dose is reduced to ONE 5 mg tablet taken TWICE daily.  Eliquis may be taken with or without food.   Try to take the dose about the same time in the morning and in the evening. If you have difficulty swallowing the tablet whole please discuss with your pharmacist how to take the medication safely.  Take Eliquis exactly as prescribed and DO NOT stop taking Eliquis without talking to the doctor who prescribed the medication.  Stopping may increase your risk of developing a new blood clot.  Refill your prescription before you run out.  After discharge, you should have regular check-up appointments with your healthcare provider that is prescribing your Eliquis.    What do you do if you miss a dose? If a dose of ELIQUIS is not taken at the scheduled time, take it as soon as possible on the same day and twice-daily administration should be resumed. The dose should not be doubled to make up for a missed dose.  Important Safety Information A possible side effect of Eliquis is bleeding. You should call your healthcare provider right away if you experience any of the following: ? Bleeding from an injury or your nose that does not stop. ? Unusual colored urine (red or dark brown) or unusual colored stools (red or black). ? Unusual bruising for unknown reasons. ? A serious fall or if you hit your head (even if there is no bleeding).  Some  medicines may interact with Eliquis and might increase your risk of bleeding or clotting while on Eliquis. To help avoid this, consult your healthcare provider or pharmacist prior to using any new prescription or non-prescription medications, including herbals, vitamins, non-steroidal anti-inflammatory drugs (NSAIDs) and supplements.  This website has more information on Eliquis (apixaban): http://www.eliquis.com/eliquis/home

## 2020-05-05 NOTE — Progress Notes (Signed)
PT Cancellation Note  Patient Details Name: Cameron Ford MRN: [id number] DOB: 08-12-1945   Cancelled Treatment:    Reason Eval/Treat Not Completed: Medical issues which prohibited therapy (Consult received and chart reviewed.  Patient noted with acute PE, anticoag initiated 1/2 at 1900; however, cleared for initiation of evaluation per physician.  Upon arrival to room, patient notably agitated, cussing, spitting at nurse (trying to administer meds at bedside).  Sats noted at 84-85% on 2L; improved to 88% with 4-5L.   RN informed/aware and to review with MD.)   Tommy Rainwater. Manson Passey, PT, DPT, NCS 05/05/20, 12:16 PM [phone]

## 2020-05-06 DIAGNOSIS — N390 Urinary tract infection, site not specified: Secondary | ICD-10-CM | POA: Diagnosis not present

## 2020-05-06 DIAGNOSIS — J9601 Acute respiratory failure with hypoxia: Secondary | ICD-10-CM | POA: Diagnosis not present

## 2020-05-06 DIAGNOSIS — I5031 Acute diastolic (congestive) heart failure: Secondary | ICD-10-CM | POA: Diagnosis not present

## 2020-05-06 DIAGNOSIS — B9620 Unspecified Escherichia coli [E. coli] as the cause of diseases classified elsewhere: Secondary | ICD-10-CM

## 2020-05-06 DIAGNOSIS — I2699 Other pulmonary embolism without acute cor pulmonale: Secondary | ICD-10-CM | POA: Diagnosis not present

## 2020-05-06 DIAGNOSIS — R531 Weakness: Secondary | ICD-10-CM

## 2020-05-06 DIAGNOSIS — K5900 Constipation, unspecified: Secondary | ICD-10-CM

## 2020-05-06 LAB — ECHOCARDIOGRAM COMPLETE
AR max vel: 1.78 cm2
AV Area VTI: 2.05 cm2
AV Area mean vel: 1.92 cm2
AV Mean grad: 9.5 mmHg
AV Peak grad: 14.4 mmHg
Ao pk vel: 1.9 m/s
Area-P 1/2: 3.37 cm2
Height: 70 in
S' Lateral: 2.94 cm
Weight: 2800.72 oz

## 2020-05-06 MED ORDER — POTASSIUM CHLORIDE CRYS ER 20 MEQ PO TBCR
20.0000 meq | EXTENDED_RELEASE_TABLET | Freq: Once | ORAL | Status: AC
  Administered 2020-05-06: 20 meq via ORAL
  Filled 2020-05-06: qty 1

## 2020-05-06 MED ORDER — FUROSEMIDE 20 MG PO TABS
20.0000 mg | ORAL_TABLET | Freq: Every day | ORAL | Status: DC
  Administered 2020-05-07: 20 mg via ORAL
  Filled 2020-05-06: qty 1

## 2020-05-06 MED ORDER — BISACODYL 5 MG PO TBEC: 5.0000 mg | DELAYED_RELEASE_TABLET | Freq: Once | ORAL | Status: DC

## 2020-05-06 MED ORDER — CEPHALEXIN 500 MG PO CAPS
500.0000 mg | ORAL_CAPSULE | Freq: Two times a day (BID) | ORAL | Status: DC
  Administered 2020-05-06 – 2020-05-08 (×5): 500 mg via ORAL
  Filled 2020-05-06 (×5): qty 1

## 2020-05-06 MED ORDER — FUROSEMIDE 10 MG/ML IJ SOLN
40.0000 mg | Freq: Once | INTRAMUSCULAR | Status: AC
  Administered 2020-05-06: 40 mg via INTRAVENOUS
  Filled 2020-05-06: qty 4

## 2020-05-06 MED ORDER — FLEET ENEMA 7-19 GM/118ML RE ENEM: 1.0000 | ENEMA | Freq: Once | RECTAL | Status: DC

## 2020-05-06 NOTE — TOC Progression Note (Signed)
[facility]) - Progression Note    Patient Details  Name: Cameron Ford MRN: [id number] Date of Birth: March 19, 1946  [facility]) CM/SW Contact  Trenton Founds, RN Phone Number: 05/06/2020, 8:16 AM  Clinical Narrative:   RNCM reached out and left voice mail for patient' legal guardian [place].          Expected Discharge Plan and Services                                                 Social Determinants of Health (SDOH) Interventions    Readmission Risk Interventions No flowsheet data found.
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
## 2020-05-06 NOTE — Evaluation (Signed)
Physical Therapy Evaluation Patient Details Name: Cameron Ford MRN: [id number] DOB: 1945/06/16 Today's Date: 05/06/2020   History of Present Illness  Cameron Ford is a 75 y.o. male with medical history significant for dementia, type I bipolar disorder, benign essential tremors, hypertension, moderate persistent asthma who is admitted to [facility] on 05/03/2020 with acute hypoxic respiratory distress in the setting of acute pulmonary embolism after presenting from group home to [facility] Emergency Department for evaluation of confusion  Clinical Impression  Patient resting in bed upon arrival to room.  Alert and oriented to self only.  Intermittently follows simple commands, optimized with demonstration/return demonstration from therapist.  Mild agitation noted this date, but redirectable and notably improved from previous session attempts.  Patient appears generally weak and deconditioned; baseline tremors bilat UEs and limited L shoulder ROM noted. Maintains LEs in generally extended position with sustained adduction; generally resistive to act assist movement attempts from therapist throughout all planes.  Currently requiring max/dep assist +2 for rolling, supine/sit; max assist +1-2 for unsupported sitting balance.  Does progress to periods of min/mod assist, for unsupported sitting balance.  Very rounded shoulders, forward head, downward gaze; limited/no spontaneous righting reactions noted. Unsafe/unable to attempt additional transfers, standing or OOB activities at this time.  Will continue to assess/progress as appropriate. Of note, sats >92% on 4L at rest and with exertion throughout session.  Less work of breathing noted today. Would benefit from skilled PT to address above deficits and promote optimal return to PLOF.; recommend transition to STR upon discharge from acute hospitalization.     Follow Up Recommendations SNF    Equipment Recommendations        Recommendations for Other Services       Precautions / Restrictions Precautions Precautions: Fall Restrictions Weight Bearing Restrictions: No      Mobility  Bed Mobility Overal bed mobility: Needs Assistance Bed Mobility: Supine to Sit;Sit to Supine     Supine to sit: Max assist;Total assist;+2 for physical assistance Sit to supine: Max assist;Total assist;+2 for physical assistance   General bed mobility comments: max/total assist for LE management, truncal elevation; limited ability to dissociated extremities from trunk, limited attempts to actively assist with movement transition    Transfers                 General transfer comment: unsafe/unable  Ambulation/Gait             General Gait Details: unsafe/unable  Stairs            Wheelchair Mobility    Modified Rankin (Stroke Patients Only)       Balance Overall balance assessment: Needs assistance Sitting-balance support: No upper extremity supported;Feet supported Sitting balance-Leahy Scale: Poor Sitting balance - Comments: max/dep assist, progressing to periods of min/mod assist, for unsupported sitting balance.  Very rounded shoulders, forward head, downward gaze; limited/no spontaneous righting reactions noted. Postural control: Posterior lean                                   Pertinent Vitals/Pain Pain Assessment: Faces Faces Pain Scale: No hurt    Home Living Family/patient expects to be discharged to:: Group home                      Prior Function Level of Independence: Needs assistance         Comments: Per OT documentation (  info obtained via phone call to group home), patient sup/mod indep with rollator for household distances; assist from staff for feeding, dressing and bathing due to UE tremors.     Hand Dominance   Dominant Hand: Right    Extremity/Trunk Assessment   Upper Extremity Assessment Upper Extremity Assessment: Generalized  weakness (R UE grossly 4-/5 throughout, L UE grossly 3-/5 throughout.  Resting < intention tremors bilat UEs)    Lower Extremity Assessment Lower Extremity Assessment: Generalized weakness (grossly 2-/5 throughout bilat LEs; generally resistive to act assist movement of bilat LEs (all joints, all planes).  Difficult to fully assess strength and ROM)       Communication   Communication:  (speech generally garbled)  Cognition Arousal/Alertness: Lethargic Behavior During Therapy: Agitated;Flat affect Overall Cognitive Status: No family/caregiver present to determine baseline cognitive functioning                                 General Comments: oriented to self only; does intermittently follow simple commands, optimized with demonstration/return demonstration from therapist.  Decreased agitation and combativeness compared to previous date.      General Comments      Exercises Other Exercises Other Exercises: Rollin bilat, max/total assist, for linen change, repositioning.  Very limited ability to actively assist   Assessment/Plan    PT Assessment Patient needs continued PT services  PT Problem List Decreased strength;Decreased range of motion;Decreased activity tolerance;Decreased balance;Decreased mobility;Decreased coordination;Decreased cognition;Decreased knowledge of use of DME;Decreased safety awareness;Decreased knowledge of precautions;Cardiopulmonary status limiting activity       PT Treatment Interventions DME instruction;Gait training;Functional mobility training;Therapeutic activities;Therapeutic exercise;Balance training;Cognitive remediation;Patient/family education    PT Goals (Current goals can be found in the Care Plan section)  Acute Rehab PT Goals Patient Stated Goal: To be left alone PT Goal Formulation: With patient Time For Goal Achievement: 05/20/20 Potential to Achieve Goals: Fair    Frequency Min 2X/week   Barriers to discharge         Co-evaluation               AM-PAC PT "6 Clicks" Mobility  Outcome Measure Help needed turning from your back to your side while in a flat bed without using bedrails?: A Lot Help needed moving from lying on your back to sitting on the side of a flat bed without using bedrails?: A Lot Help needed moving to and from a bed to a chair (including a wheelchair)?: Total Help needed standing up from a chair using your arms (e.g., wheelchair or bedside chair)?: Total Help needed to walk in hospital room?: Total Help needed climbing 3-5 steps with a railing? : Total 6 Click Score: 8    End of Session   Activity Tolerance: Patient tolerated treatment well Patient left: in bed;with call bell/phone within reach;with bed alarm set Nurse Communication: Mobility status PT Visit Diagnosis: Muscle weakness (generalized) (M62.81);Difficulty in walking, not elsewhere classified (R26.2)    Time: 7939-0300 PT Time Calculation (min) (ACUTE ONLY): 24 min   Charges:   PT Evaluation $PT Eval Moderate Complexity: 1 Mod PT Treatments $Therapeutic Activity: 8-22 mins      Suhaib Guzzo H. Manson Passey, PT, DPT, NCS 05/06/20, 9:55 AM [phone]

## 2020-05-06 NOTE — NC FL2 (Signed)
[place] MEDICAID FL2 LEVEL OF CARE SCREENING TOOL     IDENTIFICATION  Patient Name: Cameron Ford Birthdate: 1945/08/07 Sex: male Admission Date (Current Location): 05/03/2020  [place] and [place] Number:  Chiropodist and Address:  [facility], [address] [place], [place] [address]      Provider Number: [id number]  Attending Physician Name and Address:  Alford Highland, MD  Relative Name and Phone Number:  Darden Dates legal guardian [phone]    Current Level of Care: Hospital Recommended Level of Care: Skilled Nursing Facility Prior Approval Number:    Date Approved/Denied:   PASRR Number: [id number]  Discharge Plan: SNF    Current Diagnoses: Patient Active Problem List   Diagnosis Date Noted  . Acute metabolic encephalopathy 05/03/2020  . Acute pulmonary embolism (HCC) 05/03/2020  . Acute cystitis 05/03/2020  . AKI (acute kidney injury) (HCC) 05/03/2020  . Pain due to onychomycosis of toenails of both feet 04/15/2019  . SBO (small bowel obstruction) (HCC) 08/31/2018  . Cellulitis 08/17/2016  . Chronic constipation 02/02/2016  . HLD (hyperlipidemia) 02/02/2016  . Hypertension 02/02/2016  . Has a tremor 02/02/2016  . GI bleed 03/12/2015    Orientation RESPIRATION BLADDER Height & Weight        Normal Incontinent Weight: 79.4 kg Height:  5\' 10"  (177.8 cm)  BEHAVIORAL SYMPTOMS/MOOD NEUROLOGICAL BOWEL NUTRITION STATUS      Incontinent Diet (Dysphagia 3 with thin liquids)  AMBULATORY STATUS COMMUNICATION OF NEEDS Skin   Extensive Assist Verbally Normal                       Personal Care Assistance Level of Assistance  Total care       Total Care Assistance: Maximum assistance   Functional Limitations Info  Sight,Hearing,Speech Sight Info: Adequate Hearing Info: Adequate Speech Info: Adequate    SPECIAL CARE FACTORS FREQUENCY  PT (By licensed PT),OT (By licensed OT)                     Contractures Contractures Info: Not present    Additional Factors Info  Code Status,Allergies Code Status Info: Full Allergies Info: No known allergies           Current Medications (05/06/2020):  This is the current hospital active medication list Current Facility-Administered Medications  Medication Dose Route Frequency Provider Last Rate Last Admin  . acetaminophen (TYLENOL) tablet 650 mg  650 mg Oral Q6H PRN Howerter, Justin B, DO   650 mg at 05/05/20 07/03/20   Or  . acetaminophen (TYLENOL) suppository 650 mg  650 mg Rectal Q6H PRN Howerter, Justin B, DO      . albuterol (PROVENTIL) (2.5 MG/3ML) 0.083% nebulizer solution 2.5 mg  2.5 mg Nebulization Q4H PRN Howerter, Justin B, DO      . apixaban (ELIQUIS) tablet 10 mg  10 mg Oral BID [id number] B, MD   10 mg at 05/06/20 0846   Followed by  . [START ON 05/12/2020] apixaban (ELIQUIS) tablet 5 mg  5 mg Oral BID 07/10/2020 B, MD      . carbamazepine (TEGRETOL) tablet 200 mg  200 mg Oral BID Howerter, Justin B, DO   200 mg at 05/06/20 0844  . cephALEXin (KEFLEX) capsule 500 mg  500 mg Oral Q12H Wieting, Richard, MD      . lithium carbonate capsule 150 mg  150 mg Oral BID WC Hallaji, Sheema M, RPH   150 mg  at 05/06/20 0844  . loratadine (CLARITIN) tablet 10 mg  10 mg Oral Daily Manuela Schwartz, NP   10 mg at 05/06/20 0846  . lubiprostone (AMITIZA) capsule 8 mcg  8 mcg Oral BID WC Howerter, Justin B, DO   8 mcg at 05/04/20 1744  . mometasone-formoterol (DULERA) 100-5 MCG/ACT inhaler 2 puff  2 puff Inhalation BID Howerter, Justin B, DO   2 puff at 05/06/20 0846  . polyethylene glycol (MIRALAX / GLYCOLAX) packet 17 g  17 g Oral BID Howerter, Justin B, DO   17 g at 05/06/20 0846  . risperiDONE (RISPERDAL) tablet 1 mg  1 mg Oral Q8H Howerter, Justin B, DO   1 mg at 05/06/20 5003     Discharge Medications: Please see discharge summary for a list of discharge medications.  Relevant Imaging Results:  Relevant Lab  Results:   Additional Information SS# [id number]  Trenton Founds, RN

## 2020-05-06 NOTE — TOC Progression Note (Signed)
Transition of Care [facility]) - Progression Note    Patient Details  Name: Cameron Ford MRN: [id number] Date of Birth: 1945-09-23  [facility] CM/SW Contact  Trenton Founds, RN Phone Number: 05/06/2020, 1:30 PM  Clinical Narrative:   RNCM reached out to facility and spoke with Maurine Minister, facility staff. He reports that patient has lived in that facility for about the last 5 years but over the last couple of weeks he has become more dependent and less able to help care of himself.  RNCM placed additional call to [facility], [name] reports that she understands that patient will need skilled nursing at discharge and is open to [facility] in [place] or any facilities in [place] that might be available.  RNCM verified PASSR, completed FL2 and started bed search.           Expected Discharge Plan and Services                                                 Social Determinants of Health (SDOH) Interventions    Readmission Risk Interventions No flowsheet data found.

## 2020-05-07 DIAGNOSIS — N390 Urinary tract infection, site not specified: Secondary | ICD-10-CM | POA: Diagnosis not present

## 2020-05-07 DIAGNOSIS — I2699 Other pulmonary embolism without acute cor pulmonale: Secondary | ICD-10-CM | POA: Diagnosis not present

## 2020-05-07 DIAGNOSIS — I5031 Acute diastolic (congestive) heart failure: Secondary | ICD-10-CM | POA: Diagnosis not present

## 2020-05-07 DIAGNOSIS — J9601 Acute respiratory failure with hypoxia: Secondary | ICD-10-CM | POA: Diagnosis not present

## 2020-05-07 LAB — CBC
HCT: 34.9 % — ABNORMAL LOW (ref 39.0–52.0)
Hemoglobin: 11.4 g/dL — ABNORMAL LOW (ref 13.0–17.0)
MCH: 33.4 pg (ref 26.0–34.0)
MCHC: 32.7 g/dL (ref 30.0–36.0)
MCV: 102.3 fL — ABNORMAL HIGH (ref 80.0–100.0)
Platelets: 272 10*3/uL (ref 150–400)
RBC: 3.41 MIL/uL — ABNORMAL LOW (ref 4.22–5.81)
RDW: 14.4 % (ref 11.5–15.5)
WBC: 6.7 10*3/uL (ref 4.0–10.5)
nRBC: 0 % (ref 0.0–0.2)

## 2020-05-07 LAB — BASIC METABOLIC PANEL
Anion gap: 10 (ref 5–15)
BUN: 19 mg/dL (ref 8–23)
CO2: 36 mmol/L — ABNORMAL HIGH (ref 22–32)
Calcium: 9.2 mg/dL (ref 8.9–10.3)
Chloride: 91 mmol/L — ABNORMAL LOW (ref 98–111)
Creatinine, Ser: 0.8 mg/dL (ref 0.61–1.24)
GFR, Estimated: >60 mL/min (ref >60)
Glucose, Bld: 106 mg/dL — ABNORMAL HIGH (ref 70–99)
Potassium: 4.1 mmol/L (ref 3.5–5.1)
Sodium: 137 mmol/L (ref 135–145)

## 2020-05-07 MED ORDER — BISACODYL 10 MG RE SUPP
10.0000 mg | Freq: Once | RECTAL | Status: AC
  Administered 2020-05-07: 10 mg via RECTAL
  Filled 2020-05-07: qty 1

## 2020-05-07 MED ORDER — METOPROLOL SUCCINATE ER 25 MG PO TB24
12.5000 mg | ORAL_TABLET | Freq: Every day | ORAL | Status: DC
  Filled 2020-05-07: qty 1

## 2020-05-07 MED ORDER — METOPROLOL TARTRATE 25 MG/10 ML ORAL SUSPENSION
6.2500 mg | Freq: Two times a day (BID) | ORAL | Status: DC
  Administered 2020-05-08: 6.25 mg via ORAL
  Filled 2020-05-07 (×5): qty 2.5

## 2020-05-07 MED ORDER — LACTULOSE 10 GM/15ML PO SOLN
30.0000 g | Freq: Two times a day (BID) | ORAL | Status: DC
  Filled 2020-05-07 (×2): qty 60

## 2020-05-07 NOTE — Care Management Important Message (Signed)
Important Message  Patient Details  Name: Cameron Ford MRN: [id number] Date of Birth: 02-15-1946   Medicare Important Message Given:  Yes     [name] [name] 05/07/2020, 11:01 AM

## 2020-05-07 NOTE — Progress Notes (Signed)
Patient ID: Cameron Ford, male   DOB: February 10, 1946, 75 y.o.   MRN: [id number] [facility] PROGRESS NOTE  Cameron Ford [id number] DOB: 08-26-1945 DOA: 05/03/2020 PCP: Kaleen Mask, MD  HPI/Subjective: The patient asked me when he can get out of the hospital.  No complaints of chest pain or shortness of breath.  Patient admitted with acute hypoxic respiratory failure secondary to pulmonary embolism and fluid overload.  Objective: Vitals:   05/07/20 1136 05/07/20 1555  BP: 122/64 113/65  Pulse: 85 89  Resp: 18 15  Temp: 98.9 F (37.2 C) 98.8 F (37.1 C)  SpO2: 93% 94%   No intake or output data in the 24 hours ending 05/07/20 1611 Filed Weights   05/03/20 1256 05/04/20 1600  Weight: 79.4 kg 79.4 kg    ROS: Review of Systems  Respiratory: Negative for shortness of breath.   Cardiovascular: Negative for chest pain.  Gastrointestinal: Negative for abdominal pain, nausea and vomiting.   Exam: Physical Exam HENT:     Head: Normocephalic.     Mouth/Throat:     Pharynx: No oropharyngeal exudate.  Eyes:     General: Lids are normal.     Conjunctiva/sclera: Conjunctivae normal.  Cardiovascular:     Rate and Rhythm: Normal rate and regular rhythm.     Heart sounds: Normal heart sounds, S1 normal and S2 normal.  Pulmonary:     Breath sounds: Examination of the right-lower field reveals decreased breath sounds. Examination of the left-lower field reveals decreased breath sounds. Decreased breath sounds present. No wheezing, rhonchi or rales.  Abdominal:     Palpations: Abdomen is soft.     Tenderness: There is no abdominal tenderness.  Musculoskeletal:     Right lower leg: Swelling present.     Left lower leg: Swelling present.  Skin:    General: Skin is warm.     Findings: No rash.  Neurological:     Mental Status: He is alert.     Comments: Answers some yes or no questions       Data Reviewed: Basic Metabolic Panel: Recent Labs  Lab  05/03/20 1416 05/04/20 0529 05/07/20 0355  NA 137 138 137  K 4.5 4.4 4.1  CL 101 102 91*  CO2 30 27 36*  GLUCOSE 135* 106* 106*  BUN 26* 19 19  CREATININE 1.34* 0.96 0.80  CALCIUM 9.2 8.9 9.2  MG 2.1 1.7  --   PHOS  --  3.7  --    Liver Function Tests: Recent Labs  Lab 05/03/20 1416 05/04/20 0529  AST 15 17  ALT 15 13  ALKPHOS 85 94  BILITOT 0.5 0.6  PROT 6.8 6.8  ALBUMIN 3.6 3.7   CBC: Recent Labs  Lab 05/03/20 1416 05/04/20 0529 05/05/20 0812 05/07/20 0355  WBC 6.5 8.2 7.1 6.7  NEUTROABS 4.6  --   --   --   HGB 10.7* 10.6* 9.9* 11.4*  HCT 32.7* 32.6* 30.3* 34.9*  MCV 102.5* 102.8* 102.7* 102.3*  PLT 254 259 246 272   BNP (last 3 results) Recent Labs    05/03/20 1416  BNP 72.8      Recent Results (from the past 240 hour(s))  Urine Culture     Status: Abnormal   Collection Time: 05/03/20  2:16 PM   Specimen: Urine, Random  Result Value Ref Range Status   Specimen Description   Final    URINE, RANDOM Performed at [facility], [address], [place], [place]  [address]    Special Requests   Final    NONE Performed at [facility], [address] [place], [place] [address]    Culture >=100,000 COLONIES/mL ESCHERICHIA COLI (A)  Final   Report Status 05/05/2020 FINAL  Final   Organism ID, Bacteria ESCHERICHIA COLI (A)  Final      Susceptibility   Escherichia coli - MIC*    AMPICILLIN 16 INTERMEDIATE Intermediate     CEFAZOLIN <=4 SENSITIVE Sensitive     CEFEPIME <=0.12 SENSITIVE Sensitive     CEFTRIAXONE <=0.25 SENSITIVE Sensitive     CIPROFLOXACIN >=4 RESISTANT Resistant     GENTAMICIN <=1 SENSITIVE Sensitive     IMIPENEM <=0.25 SENSITIVE Sensitive     NITROFURANTOIN 32 SENSITIVE Sensitive     TRIMETH/SULFA <=20 SENSITIVE Sensitive     AMPICILLIN/SULBACTAM 8 SENSITIVE Sensitive     PIP/TAZO <=4 SENSITIVE Sensitive     * >=100,000 COLONIES/mL ESCHERICHIA COLI  Resp Panel by RT-PCR (Flu A&B, Covid) Nasopharyngeal Swab      Status: None   Collection Time: 05/03/20  5:29 PM   Specimen: Nasopharyngeal Swab; Nasopharyngeal(NP) swabs in vial transport medium  Result Value Ref Range Status   SARS Coronavirus 2 by RT PCR NEGATIVE NEGATIVE Final    Comment: (NOTE) SARS-CoV-2 target nucleic acids are NOT DETECTED.  The SARS-CoV-2 RNA is generally detectable in upper respiratory specimens during the acute phase of infection. The lowest concentration of SARS-CoV-2 viral copies this assay can detect is 138 copies/mL. A negative result does not preclude SARS-Cov-2 infection and should not be used as the sole basis for treatment or other patient management decisions. A negative result may occur with  improper specimen collection/handling, submission of specimen other than nasopharyngeal swab, presence of viral mutation(s) within the areas targeted by this assay, and inadequate number of viral copies(<138 copies/mL). A negative result must be combined with clinical observations, patient history, and epidemiological information. The expected result is Negative.  Fact Sheet for Patients:  EntrepreneurPulse.com.au  Fact Sheet for Healthcare Providers:  IncredibleEmployment.be  This test is no t yet approved or cleared by the [place] FDA and  has been authorized for detection and/or diagnosis of SARS-CoV-2 by FDA under an Emergency Use Authorization (EUA). This EUA will remain  in effect (meaning this test can be used) for the duration of the COVID-19 declaration under Section 564(b)(1) of the Act, 21 U.S.C.section 360bbb-3(b)(1), unless the authorization is terminated  or revoked sooner.       Influenza A by PCR NEGATIVE NEGATIVE Final   Influenza B by PCR NEGATIVE NEGATIVE Final    Comment: (NOTE) The Xpert Xpress SARS-CoV-2/FLU/RSV plus assay is intended as an aid in the diagnosis of influenza from Nasopharyngeal swab specimens and should not be used as a sole basis  for treatment. Nasal washings and aspirates are unacceptable for Xpert Xpress SARS-CoV-2/FLU/RSV testing.  Fact Sheet for Patients: EntrepreneurPulse.com.au  Fact Sheet for Healthcare Providers: IncredibleEmployment.be  This test is not yet approved or cleared by the [place] FDA and has been authorized for detection and/or diagnosis of SARS-CoV-2 by FDA under an Emergency Use Authorization (EUA). This EUA will remain in effect (meaning this test can be used) for the duration of the COVID-19 declaration under Section 564(b)(1) of the Act, 21 U.S.C. section 360bbb-3(b)(1), unless the authorization is terminated or revoked.  Performed at [facility], [address] [place], [place] [address]   Blood culture (routine x 2)     Status: None (  Preliminary result)   Collection Time: 05/03/20  6:10 PM   Specimen: BLOOD  Result Value Ref Range Status   Specimen Description BLOOD LEFT ANTECUBITAL  Final   Special Requests   Final    BOTTLES DRAWN AEROBIC AND ANAEROBIC Blood Culture adequate volume   Culture   Final    NO GROWTH 4 DAYS Performed at [facility], [address] [place], [place] [address]    Report Status PENDING  Incomplete  Blood culture (routine x 2)     Status: None (Preliminary result)   Collection Time: 05/03/20  6:23 PM   Specimen: BLOOD  Result Value Ref Range Status   Specimen Description BLOOD BLOOD LEFT HAND  Final   Special Requests   Final    BOTTLES DRAWN AEROBIC AND ANAEROBIC Blood Culture results may not be optimal due to an excessive volume of blood received in culture bottles   Culture   Final    NO GROWTH 4 DAYS Performed at [facility], [address] [place], [place] [address]    Report Status PENDING  Incomplete     Studies: ECHOCARDIOGRAM COMPLETE  Result Date: 05/06/2020    ECHOCARDIOGRAM REPORT   Patient Name:   Cameron Ford Date of Exam: 05/05/2020 Medical Rec #:   [id number]       Height:       70.0 in Accession #:    [id number]      Weight:       175.0 lb Date of Birth:  25-Nov-1945       BSA:          1.972 m Patient Age:    74 years        BP:           140/59 mmHg Patient Gender: M               HR:           101 bpm. Exam Location:  [facility] Procedure: 2D Echo, Cardiac Doppler and Color Doppler Indications:     I50.31 Acute Diastolic CHF  History:         Patient has no prior history of Echocardiogram examinations.                  Risk Factors:Hypertension and Dyslipidemia. Stroke. Dementia.  Sonographer:     Sedonia Small Rodgers-Jones Referring Phys:  [id number] Tresa Moore Diagnosing Phys: Arnoldo Hooker MD IMPRESSIONS  1. Left ventricular ejection fraction, by estimation, is 60 to 65%. The left ventricle has normal function. The left ventricle has no regional wall motion abnormalities. Left ventricular diastolic parameters were normal.  2. Right ventricular systolic function is normal. The right ventricular size is normal.  3. The mitral valve is normal in structure. Trivial mitral valve regurgitation.  4. The aortic valve is normal in structure. Aortic valve regurgitation is trivial. FINDINGS  Left Ventricle: Left ventricular ejection fraction, by estimation, is 60 to 65%. The left ventricle has normal function. The left ventricle has no regional wall motion abnormalities. The left ventricular internal cavity size was normal in size. There is  no left ventricular hypertrophy. Left ventricular diastolic parameters were normal. Right Ventricle: The right ventricular size is normal. No increase in right ventricular wall thickness. Right ventricular systolic function is normal. Left Atrium: Left atrial size was normal in size. Right Atrium: Right atrial size was normal in size. Pericardium: There is no evidence of pericardial effusion. Mitral Valve: The mitral valve  is normal in structure. Trivial mitral valve regurgitation. Tricuspid Valve: The tricuspid valve is normal in  structure. Tricuspid valve regurgitation is trivial. Aortic Valve: The aortic valve is normal in structure. Aortic valve regurgitation is trivial. Aortic valve mean gradient measures 9.5 mmHg. Aortic valve peak gradient measures 14.4 mmHg. Aortic valve area, by VTI measures 2.05 cm. Pulmonic Valve: The pulmonic valve was normal in structure. Pulmonic valve regurgitation is not visualized. Aorta: The aortic root and ascending aorta are structurally normal, with no evidence of dilitation. IAS/Shunts: No atrial level shunt detected by color flow Doppler.  LEFT VENTRICLE PLAX 2D LVIDd:         4.60 cm  Diastology LVIDs:         2.94 cm  LV e' medial:    7.40 cm/s LV PW:         0.75 cm  LV E/e' medial:  8.1 LV IVS:        0.92 cm  LV e' lateral:   7.72 cm/s LVOT diam:     2.10 cm  LV E/e' lateral: 7.7 LV SV:         68 LV SV Index:   34 LVOT Area:     3.46 cm  RIGHT VENTRICLE             IVC RV Basal diam:  4.33 cm     IVC diam: 1.11 cm RV S prime:     24.90 cm/s TAPSE (M-mode): 2.5 cm LEFT ATRIUM             Index       RIGHT ATRIUM           Index LA diam:        4.30 cm 2.18 cm/m  RA Area:     15.20 cm LA Vol (A2C):   58.1 ml 29.46 ml/m RA Volume:   38.30 ml  19.42 ml/m LA Vol (A4C):   48.0 ml 24.34 ml/m LA Biplane Vol: 53.8 ml 27.28 ml/m  AORTIC VALVE AV Area (Vmax):    1.78 cm AV Area (Vmean):   1.92 cm AV Area (VTI):     2.05 cm AV Vmax:           190.00 cm/s AV Vmean:          148.500 cm/s AV VTI:            0.330 m AV Peak Grad:      14.4 mmHg AV Mean Grad:      9.5 mmHg LVOT Vmax:         97.90 cm/s LVOT Vmean:        82.500 cm/s LVOT VTI:          0.195 m LVOT/AV VTI ratio: 0.59  AORTA Ao Root diam: 3.30 cm MITRAL VALVE MV Area (PHT): 3.37 cm    SHUNTS MV Decel Time: 225 msec    Systemic VTI:  0.20 m MV E velocity: 59.70 cm/s  Systemic Diam: 2.10 cm MV A velocity: 75.70 cm/s MV E/A ratio:  0.79 Arnoldo Hooker MD Electronically signed by Arnoldo Hooker MD Signature Date/Time: 05/06/2020/11:21:57 AM     Final     Scheduled Meds: . apixaban  10 mg Oral BID   Followed by  . [START ON 05/12/2020] apixaban  5 mg Oral BID  . bisacodyl  5 mg Oral Once  . carbamazepine  200 mg Oral BID  . cephALEXin  500 mg Oral Q12H  . furosemide  20  mg Oral Daily  . lithium carbonate  150 mg Oral BID WC  . loratadine  10 mg Oral Daily  . lubiprostone  8 mcg Oral BID WC  . mometasone-formoterol  2 puff Inhalation BID  . polyethylene glycol  17 g Oral BID  . risperiDONE  1 mg Oral Q8H  . sodium phosphate  1 enema Rectal Once    Assessment/Plan:  1. Acute hypoxic respiratory failure secondary to pulmonary embolism with fluid overload.  Room air saturation 86% on 05/05/2019.  Patient also had a VBG showing a pH of 7.33 (less than 7.35).  Check a pulse ox on room air. 2. Acute pulmonary embolism.  Patient initially on heparin infusion and switched over to Eliquis. 3. Acute diastolic congestive heart failure with fluid overload.  IV Lasix given yesterday and the day before and switched over to oral Lasix for today.  We will start low-dose Toprol-XL. 4. E. coli urinary tract infection with delirium and underlying dementia.  Sepsis ruled out.  Currently on Keflex. 5. Constipation.  Will give a Dulcolax suppository.  Looks like my Fleet enema ordered from yesterday was not given.  Will give lactulose twice daily until bowel movement. 6. Dementia, bipolar disorder.  On lithium and risperidone 7. Essential hypertension.  Lisinopril on hold.  Will start low-dose Toprol XL. 8. Weakness.  Physical therapy recommends rehab.    Code Status:     Code Status Orders  (From admission, onward)         Start     Ordered   05/03/20 2143  Full code  Continuous        05/03/20 2143        Code Status History    Date Active Date Inactive Code Status Order ID Comments User Context   08/31/2018 1814 09/04/2018 1944 Full Code [id number]  Chauncey Mann ED   08/17/2016 0926 08/19/2016 1813 Full Code [id number]  Ihor Austin, MD Inpatient   03/12/2015 2128 03/14/2015 1845 Full Code [id number]  Houston Siren, MD Inpatient   Advance Care Planning Activity     Family Communication: Case discussed with legal guardian.  Mentioned that the patient is still listed as a full code.  The patient's guardian knows that the patient has been declining. disposition Plan: Status is: Inpatient  Dispo: The patient is from: Group home              Anticipated d/c is to: Rehab              Anticipated d/c date is: Whenever rehab bed obtained              Patient currently awaiting to hear back from rehab facilities  Time spent: 27 minutes  Auna Mikkelsen [employer]

## 2020-05-08 ENCOUNTER — Inpatient Hospital Stay: Payer: Medicare Other

## 2020-05-08 DIAGNOSIS — J441 Chronic obstructive pulmonary disease with (acute) exacerbation: Secondary | ICD-10-CM

## 2020-05-08 DIAGNOSIS — I5031 Acute diastolic (congestive) heart failure: Secondary | ICD-10-CM | POA: Diagnosis not present

## 2020-05-08 DIAGNOSIS — I2699 Other pulmonary embolism without acute cor pulmonale: Secondary | ICD-10-CM | POA: Diagnosis not present

## 2020-05-08 DIAGNOSIS — J9601 Acute respiratory failure with hypoxia: Secondary | ICD-10-CM | POA: Diagnosis not present

## 2020-05-08 LAB — CULTURE, BLOOD (ROUTINE X 2)
Culture: NO GROWTH
Culture: NO GROWTH
Special Requests: ADEQUATE

## 2020-05-08 MED ORDER — AMOXICILLIN-POT CLAVULANATE 875-125 MG PO TABS
1.0000 | ORAL_TABLET | Freq: Two times a day (BID) | ORAL | Status: AC
  Administered 2020-05-08 – 2020-05-13 (×10): 1 via ORAL
  Filled 2020-05-08 (×10): qty 1

## 2020-05-08 MED ORDER — HYDROCORTISONE (PERIANAL) 2.5 % EX CREA
TOPICAL_CREAM | Freq: Four times a day (QID) | CUTANEOUS | Status: DC
  Administered 2020-05-12 (×2): 1 via TOPICAL
  Filled 2020-05-08 (×2): qty 28.35

## 2020-05-08 MED ORDER — HYDROCORTISONE ACETATE 25 MG RE SUPP
25.0000 mg | Freq: Two times a day (BID) | RECTAL | Status: DC
  Administered 2020-05-08 – 2020-05-09 (×2): 25 mg via RECTAL
  Filled 2020-05-08 (×4): qty 1

## 2020-05-08 MED ORDER — FUROSEMIDE 10 MG/ML IJ SOLN
40.0000 mg | Freq: Once | INTRAMUSCULAR | Status: AC
  Administered 2020-05-08: 40 mg via INTRAVENOUS
  Filled 2020-05-08: qty 4

## 2020-05-08 MED ORDER — FUROSEMIDE 20 MG PO TABS
20.0000 mg | ORAL_TABLET | Freq: Two times a day (BID) | ORAL | Status: DC
  Administered 2020-05-08 – 2020-05-09 (×2): 20 mg via ORAL
  Filled 2020-05-08 (×2): qty 1

## 2020-05-08 MED ORDER — BISOPROLOL FUMARATE 5 MG PO TABS
5.0000 mg | ORAL_TABLET | Freq: Every day | ORAL | Status: DC
  Administered 2020-05-09: 5 mg via ORAL
  Filled 2020-05-08 (×2): qty 1

## 2020-05-08 MED ORDER — IPRATROPIUM-ALBUTEROL 0.5-2.5 (3) MG/3ML IN SOLN
3.0000 mL | Freq: Four times a day (QID) | RESPIRATORY_TRACT | Status: DC
  Administered 2020-05-09 – 2020-05-10 (×5): 3 mL via RESPIRATORY_TRACT
  Filled 2020-05-08 (×5): qty 3

## 2020-05-08 MED ORDER — POLYETHYLENE GLYCOL 3350 17 G PO PACK: 17.0000 g | PACK | Freq: Every day | ORAL | Status: DC | PRN

## 2020-05-08 MED ORDER — METHYLPREDNISOLONE SODIUM SUCC 40 MG IJ SOLR
40.0000 mg | Freq: Every day | INTRAMUSCULAR | Status: DC
  Administered 2020-05-08 – 2020-05-10 (×3): 40 mg via INTRAVENOUS
  Filled 2020-05-08 (×4): qty 1

## 2020-05-08 NOTE — Progress Notes (Signed)
ANTICOAGULATION CONSULT NOTE  Pharmacy Consult for Apixaban dosing  Indication: pulmonary embolus  Patient Measurements: Heparin Dosing Weight: 79.4 kg  Labs: Recent Labs    05/05/20 0812 05/07/20 0355  HGB 9.9* 11.4*  HCT 30.3* 34.9*  PLT 246 272  HEPARINUNFRC 0.76*  --   CREATININE  --  0.80    Estimated Creatinine Clearance: 81.1 mL/min (by C-G formula based on SCr of 0.8 mg/dL).  Medical History: Past Medical History:  Diagnosis Date  . Asthma   . Bipolar 1 disorder (HCC)   . Chronic constipation   . Dementia (HCC)   . History of small bowel obstruction   . Hyperlipidemia   . Hypertension   . Microalbuminuria   . Neuromuscular disorder (HCC)    tremors  . Stroke [facility])     Assessment: Pharmacy consulted for Heparin dosing in this 75yo male for PE with right heart strain on 1/2. Pharmacy consulted for transition from heparin to oral anticoagulant on 1/4.  Patient is prescribed carbamazepine.  The effects of apixaban/Rivarxoaban may be decreased in patients on a strong CYP3A4 Inducer and P-gp Inducers (carbamazepine) Concomitant use should be avoided. The best [facility] option for this patient would be warfarin, so that INR could be monitored.  MD would like to proceed with Apixaban due to patient living in group home and not sure that INR level monitoring would be done appropriately.   Plan:  Continue apixaban 10mg  BID x 4 more days, followed by apixaban 5mg  daily.  Monitor CBC every 3 days per protocol while inpatient.   , PharmD, BCPS Clinical Pharmacist 05/08/2020 7:34 AM

## 2020-05-08 NOTE — Progress Notes (Signed)
Patient ID: Cameron Ford, male   DOB: May 27, 1945, 75 y.o.   MRN: [id number] [facility] PROGRESS NOTE  Cameron Ford:[id number] DOB: 1945-09-03 DOA: 05/03/2020 PCP: Kaleen Mask, MD  HPI/Subjective: Patient stated he needed surgery for a hemorrhoid but it was bothering him.  Apparently had quite a few bowel movements yesterday with the regimen that I prescribed.  No complaints of shortness of breath but does have some cough.  Objective: Vitals:   05/08/20 1132 05/08/20 1558  BP: 130/69 123/64  Pulse: 94 82  Resp: 18 17  Temp: 98.8 F (37.1 C) 98.2 F (36.8 C)  SpO2: 93% 95%    Intake/Output Summary (Last 24 hours) at 05/08/2020 1655 Last data filed at 05/08/2020 1413 Gross per 24 hour  Intake 480 ml  Output 600 ml  Net -120 ml   Filed Weights   05/03/20 1256 05/04/20 1600 05/08/20 0500  Weight: 79.4 kg 79.4 kg 77 kg    ROS: Review of Systems  Respiratory: Negative for shortness of breath.   Cardiovascular: Negative for chest pain.  Gastrointestinal: Negative for abdominal pain, nausea and vomiting.   Exam: Physical Exam HENT:     Head: Normocephalic.     Mouth/Throat:     Pharynx: No oropharyngeal exudate.  Eyes:     General: Lids are normal.     Conjunctiva/sclera: Conjunctivae normal.     Pupils: Pupils are equal, round, and reactive to light.  Cardiovascular:     Rate and Rhythm: Normal rate and regular rhythm.     Heart sounds: Normal heart sounds, S1 normal and S2 normal.  Pulmonary:     Breath sounds: Examination of the right-middle field reveals decreased breath sounds and rhonchi. Examination of the left-middle field reveals decreased breath sounds and rhonchi. Examination of the right-lower field reveals decreased breath sounds and rhonchi. Examination of the left-lower field reveals decreased breath sounds and rhonchi. Decreased breath sounds and rhonchi present. No wheezing or rales.  Abdominal:     Palpations: Abdomen is soft.      Tenderness: There is no abdominal tenderness.  Musculoskeletal:     Right ankle: Swelling present.     Left ankle: Swelling present.  Skin:    General: Skin is warm.     Findings: No rash.  Neurological:     Mental Status: He is alert.       Data Reviewed: Basic Metabolic Panel: Recent Labs  Lab 05/03/20 1416 05/04/20 0529 05/07/20 0355  NA 137 138 137  K 4.5 4.4 4.1  CL 101 102 91*  CO2 30 27 36*  GLUCOSE 135* 106* 106*  BUN 26* 19 19  CREATININE 1.34* 0.96 0.80  CALCIUM 9.2 8.9 9.2  MG 2.1 1.7  --   PHOS  --  3.7  --    Liver Function Tests: Recent Labs  Lab 05/03/20 1416 05/04/20 0529  AST 15 17  ALT 15 13  ALKPHOS 85 94  BILITOT 0.5 0.6  PROT 6.8 6.8  ALBUMIN 3.6 3.7   CBC: Recent Labs  Lab 05/03/20 1416 05/04/20 0529 05/05/20 0812 05/07/20 0355  WBC 6.5 8.2 7.1 6.7  NEUTROABS 4.6  --   --   --   HGB 10.7* 10.6* 9.9* 11.4*  HCT 32.7* 32.6* 30.3* 34.9*  MCV 102.5* 102.8* 102.7* 102.3*  PLT 254 259 246 272   BNP (last 3 results) Recent Labs    05/03/20 1416  BNP 72.8     Recent Results (from the  past 240 hour(s))  Urine Culture     Status: Abnormal   Collection Time: 05/03/20  2:16 PM   Specimen: Urine, Random  Result Value Ref Range Status   Specimen Description   Final    URINE, RANDOM Performed at [facility], [address] [place], [place] [address]    Special Requests   Final    NONE Performed at [facility], [address] [place], [place] [address]    Culture >=100,000 COLONIES/mL ESCHERICHIA COLI (A)  Final   Report Status 05/05/2020 FINAL  Final   Organism ID, Bacteria ESCHERICHIA COLI (A)  Final      Susceptibility   Escherichia coli - MIC*    AMPICILLIN 16 INTERMEDIATE Intermediate     CEFAZOLIN <=4 SENSITIVE Sensitive     CEFEPIME <=0.12 SENSITIVE Sensitive     CEFTRIAXONE <=0.25 SENSITIVE Sensitive     CIPROFLOXACIN >=4 RESISTANT Resistant     GENTAMICIN <=1 SENSITIVE Sensitive      IMIPENEM <=0.25 SENSITIVE Sensitive     NITROFURANTOIN 32 SENSITIVE Sensitive     TRIMETH/SULFA <=20 SENSITIVE Sensitive     AMPICILLIN/SULBACTAM 8 SENSITIVE Sensitive     PIP/TAZO <=4 SENSITIVE Sensitive     * >=100,000 COLONIES/mL ESCHERICHIA COLI  Resp Panel by RT-PCR (Flu A&B, Covid) Nasopharyngeal Swab     Status: None   Collection Time: 05/03/20  5:29 PM   Specimen: Nasopharyngeal Swab; Nasopharyngeal(NP) swabs in vial transport medium  Result Value Ref Range Status   SARS Coronavirus 2 by RT PCR NEGATIVE NEGATIVE Final    Comment: (NOTE) SARS-CoV-2 target nucleic acids are NOT DETECTED.  The SARS-CoV-2 RNA is generally detectable in upper respiratory specimens during the acute phase of infection. The lowest concentration of SARS-CoV-2 viral copies this assay can detect is 138 copies/mL. A negative result does not preclude SARS-Cov-2 infection and should not be used as the sole basis for treatment or other patient management decisions. A negative result may occur with  improper specimen collection/handling, submission of specimen other than nasopharyngeal swab, presence of viral mutation(s) within the areas targeted by this assay, and inadequate number of viral copies(<138 copies/mL). A negative result must be combined with clinical observations, patient history, and epidemiological information. The expected result is Negative.  Fact Sheet for Patients:  BloggerCourse.com  Fact Sheet for Healthcare Providers:  SeriousBroker.it  This test is no t yet approved or cleared by the [place] FDA and  has been authorized for detection and/or diagnosis of SARS-CoV-2 by FDA under an Emergency Use Authorization (EUA). This EUA will remain  in effect (meaning this test can be used) for the duration of the COVID-19 declaration under Section 564(b)(1) of the Act, 21 U.S.C.section 360bbb-3(b)(1), unless the authorization is terminated   or revoked sooner.       Influenza A by PCR NEGATIVE NEGATIVE Final   Influenza B by PCR NEGATIVE NEGATIVE Final    Comment: (NOTE) The Xpert Xpress SARS-CoV-2/FLU/RSV plus assay is intended as an aid in the diagnosis of influenza from Nasopharyngeal swab specimens and should not be used as a sole basis for treatment. Nasal washings and aspirates are unacceptable for Xpert Xpress SARS-CoV-2/FLU/RSV testing.  Fact Sheet for Patients: BloggerCourse.com  Fact Sheet for Healthcare Providers: SeriousBroker.it  This test is not yet approved or cleared by the [place] FDA and has been authorized for detection and/or diagnosis of SARS-CoV-2 by FDA under an Emergency Use Authorization (EUA). This EUA will remain in effect (meaning this  test can be used) for the duration of the COVID-19 declaration under Section 564(b)(1) of the Act, 21 U.S.C. section 360bbb-3(b)(1), unless the authorization is terminated or revoked.  Performed at [facility], [address] [place], [place] [address]   Blood culture (routine x 2)     Status: None   Collection Time: 05/03/20  6:10 PM   Specimen: BLOOD  Result Value Ref Range Status   Specimen Description BLOOD LEFT ANTECUBITAL  Final   Special Requests   Final    BOTTLES DRAWN AEROBIC AND ANAEROBIC Blood Culture adequate volume   Culture   Final    NO GROWTH 5 DAYS Performed at [facility], [address] [place], [place] [address]    Report Status 05/08/2020 FINAL  Final  Blood culture (routine x 2)     Status: None   Collection Time: 05/03/20  6:23 PM   Specimen: BLOOD  Result Value Ref Range Status   Specimen Description BLOOD BLOOD LEFT HAND  Final   Special Requests   Final    BOTTLES DRAWN AEROBIC AND ANAEROBIC Blood Culture results may not be optimal due to an excessive volume of blood received in culture bottles   Culture   Final    NO GROWTH 5  DAYS Performed at [facility], [address] [place], [place] [address]    Report Status 05/08/2020 FINAL  Final     Studies: DG Chest Port 1 View  Result Date: 05/08/2020 CLINICAL DATA:  75 year old male with a history altered mental status, cough EXAM: PORTABLE CHEST 1 VIEW COMPARISON:  05/05/2020, 05/03/2020, 08/31/2018 FINDINGS: Cardiomediastinal silhouette unchanged in size and contour. Partial obscuration of the left hemidiaphragm and the left heart border persists secondary to opacity at the left lung base. Similar appearance of metallic shrapnel overlying the lower left chest, unchanged from the prior. Similar appearance of coarsened interstitial markings throughout the lungs with mild interlobular septal thickening. No pneumothorax.  Pleuroparenchymal thickening at the apices. Similar appearance of chronic deformity of the left humerus. Osteopenia. Chronic left sided rib deformities. IMPRESSION: Chronic lung changes and mild edema. Similar appearance of opacity at the left lung base, likely combination of atelectasis/consolidation, and known left-sided diaphragmatic hernia/eventration. Electronically Signed   By: Gilmer Mor D.O.   On: 05/08/2020 10:10    Scheduled Meds: . amoxicillin-clavulanate  1 tablet Oral Q12H  . apixaban  10 mg Oral BID   Followed by  . [START ON 05/12/2020] apixaban  5 mg Oral BID  . carbamazepine  200 mg Oral BID  . furosemide  20 mg Oral BID  . hydrocortisone   Topical QID  . hydrocortisone  25 mg Rectal BID  . ipratropium-albuterol  3 mL Nebulization Q6H  . lactulose  30 g Oral BID  . lithium carbonate  150 mg Oral BID WC  . loratadine  10 mg Oral Daily  . lubiprostone  8 mcg Oral BID WC  . metoprolol tartrate  6.25 mg Oral BID  . mometasone-formoterol  2 puff Inhalation BID  . polyethylene glycol  17 g Oral BID  . risperiDONE  1 mg Oral Q8H  . sodium phosphate  1 enema Rectal Once    Assessment/Plan:  1. Acute hypoxic respiratory  failure secondary to pulmonary embolism with fluid overload.  Room air saturation 86% on 05/05/2019.  Patient also had a VBG showing a pH of 7.33.  Patient has more rhonchi in the lungs today. 2. Acute pulmonary embolism.  Patient initially  on heparin infusion and switched over to Eliquis. 3. Acute diastolic congestive heart failure with fluid overload.  Increase Lasix to twice daily dosing with chest x-ray showing possible fluid.  Change to bisoprolol. 4. Possible pneumonia, COPD exacerbation.  Switch antibiotics over to Augmentin for 5 days.  Nebulizer treatment.  We will give a dose of Solu-Medrol. 5. Hemorrhoids will give suppository and rectal cream 6. Constipation resolved. 7. Dementia and bipolar disorder on lithium and risperidone 8. Essential hypertension switch Toprol over to bisoprolol. 9. Weakness.  Physical therapy recommends rehab 10. E. coli UTI treated initially with IV Rocephin switched over to Keflex but will finish up the course with Augmentin    Code Status:     Code Status Orders  (From admission, onward)         Start     Ordered   05/03/20 2143  Full code  Continuous        05/03/20 2143        Code Status History    Date Active Date Inactive Code Status Order ID Comments User Context   08/31/2018 1814 09/04/2018 1944 Full Code [id number]  Carlus Pavlov ED   08/17/2016 0926 08/19/2016 1813 Full Code [id number]  Saundra Shelling, MD Inpatient   03/12/2015 2128 03/14/2015 1845 Full Code [id number]  Henreitta Leber, MD Inpatient   Advance Care Planning Activity     Family Communication: Spoke with guardian yesterday Disposition Plan: Status is: Inpatient  Dispo: The patient is from: Group home              Anticipated d/c is to: Rehab              Anticipated d/c date is: No rehab beds yet              Patient currently awaiting a rehab bed then I will assess on whether stable to go.  Since more wheezing in the lung today could be secondary to beta-blocker  started yesterday.  Time spent: 27 minutes  [place]

## 2020-05-09 DIAGNOSIS — I5031 Acute diastolic (congestive) heart failure: Secondary | ICD-10-CM | POA: Diagnosis not present

## 2020-05-09 DIAGNOSIS — K649 Unspecified hemorrhoids: Secondary | ICD-10-CM

## 2020-05-09 DIAGNOSIS — J441 Chronic obstructive pulmonary disease with (acute) exacerbation: Secondary | ICD-10-CM | POA: Diagnosis not present

## 2020-05-09 DIAGNOSIS — I2699 Other pulmonary embolism without acute cor pulmonale: Secondary | ICD-10-CM | POA: Diagnosis not present

## 2020-05-09 DIAGNOSIS — J9601 Acute respiratory failure with hypoxia: Secondary | ICD-10-CM | POA: Diagnosis not present

## 2020-05-09 LAB — BASIC METABOLIC PANEL
Anion gap: 10 (ref 5–15)
BUN: 30 mg/dL — ABNORMAL HIGH (ref 8–23)
CO2: 35 mmol/L — ABNORMAL HIGH (ref 22–32)
Calcium: 9.6 mg/dL (ref 8.9–10.3)
Chloride: 89 mmol/L — ABNORMAL LOW (ref 98–111)
Creatinine, Ser: 0.75 mg/dL (ref 0.61–1.24)
GFR, Estimated: >60 mL/min (ref >60)
Glucose, Bld: 118 mg/dL — ABNORMAL HIGH (ref 70–99)
Potassium: 4.5 mmol/L (ref 3.5–5.1)
Sodium: 134 mmol/L — ABNORMAL LOW (ref 135–145)

## 2020-05-09 LAB — CBC
HCT: 33.8 % — ABNORMAL LOW (ref 39.0–52.0)
Hemoglobin: 11.1 g/dL — ABNORMAL LOW (ref 13.0–17.0)
MCH: 32.6 pg (ref 26.0–34.0)
MCHC: 32.8 g/dL (ref 30.0–36.0)
MCV: 99.4 fL (ref 80.0–100.0)
Platelets: 315 10*3/uL (ref 150–400)
RBC: 3.4 MIL/uL — ABNORMAL LOW (ref 4.22–5.81)
RDW: 14.5 % (ref 11.5–15.5)
WBC: 7.6 10*3/uL (ref 4.0–10.5)
nRBC: 0.3 % — ABNORMAL HIGH (ref 0.0–0.2)

## 2020-05-09 MED ORDER — FUROSEMIDE 20 MG PO TABS: 20.0000 mg | ORAL_TABLET | Freq: Every day | ORAL | Status: DC

## 2020-05-09 NOTE — Progress Notes (Signed)
Patient ID: Cameron Ford, male   DOB: 1945/10/10, 75 y.o.   MRN: [id number] [facility] PROGRESS NOTE  Cameron Ford BSJ:[id number] DOB: 1945-06-19 DOA: 05/03/2020 PCP: Kaleen Mask, MD  HPI/Subjective: Patient awakened from sleep.  No complaints of shortness of breath or cough.  He states his hemorrhoids are better.  Admitted with PE and acute cystitis.  Objective: Vitals:   05/09/20 0503 05/09/20 0901  BP: (!) 115/55 109/65  Pulse: 67 86  Resp: 15 16  Temp: 98.3 F (36.8 C) 98.8 F (37.1 C)  SpO2: 94% 95%    Intake/Output Summary (Last 24 hours) at 05/09/2020 1347 Last data filed at 05/09/2020 1101 Gross per 24 hour  Intake 240 ml  Output 500 ml  Net -260 ml   Filed Weights   05/04/20 1600 05/08/20 0500 05/09/20 0503  Weight: 79.4 kg 77 kg 73.9 kg    ROS: Review of Systems  Respiratory: Negative for shortness of breath.   Cardiovascular: Negative for chest pain.  Gastrointestinal: Negative for abdominal pain, nausea and vomiting.   Exam: Physical Exam HENT:     Head: Normocephalic.     Mouth/Throat:     Pharynx: No oropharyngeal exudate.  Eyes:     General: Lids are normal.     Conjunctiva/sclera: Conjunctivae normal.  Cardiovascular:     Rate and Rhythm: Normal rate and regular rhythm.     Heart sounds: Normal heart sounds, S1 normal and S2 normal.  Pulmonary:     Breath sounds: Examination of the right-lower field reveals decreased breath sounds and rhonchi. Examination of the left-lower field reveals decreased breath sounds and rhonchi. Decreased breath sounds and rhonchi present. No wheezing or rales.  Abdominal:     Palpations: Abdomen is soft.     Tenderness: There is no abdominal tenderness.  Musculoskeletal:     Right lower leg: Swelling present.     Left lower leg: Swelling present.  Skin:    General: Skin is warm.     Findings: No rash.  Neurological:     Mental Status: He is alert.     Comments: Answers some yes or no questions.        Data Reviewed: Basic Metabolic Panel: Recent Labs  Lab 05/03/20 1416 05/04/20 0529 05/07/20 0355 05/09/20 0532  NA 137 138 137 134*  K 4.5 4.4 4.1 4.5  CL 101 102 91* 89*  CO2 30 27 36* 35*  GLUCOSE 135* 106* 106* 118*  BUN 26* 19 19 30*  CREATININE 1.34* 0.96 0.80 0.75  CALCIUM 9.2 8.9 9.2 9.6  MG 2.1 1.7  --   --   PHOS  --  3.7  --   --    Liver Function Tests: Recent Labs  Lab 05/03/20 1416 05/04/20 0529  AST 15 17  ALT 15 13  ALKPHOS 85 94  BILITOT 0.5 0.6  PROT 6.8 6.8  ALBUMIN 3.6 3.7   CBC: Recent Labs  Lab 05/03/20 1416 05/04/20 0529 05/05/20 0812 05/07/20 0355 05/09/20 0532  WBC 6.5 8.2 7.1 6.7 7.6  NEUTROABS 4.6  --   --   --   --   HGB 10.7* 10.6* 9.9* 11.4* 11.1*  HCT 32.7* 32.6* 30.3* 34.9* 33.8*  MCV 102.5* 102.8* 102.7* 102.3* 99.4  PLT 254 259 246 272 315   BNP (last 3 results) Recent Labs    05/03/20 1416  BNP 72.8     Recent Results (from the past 240 hour(s))  Urine Culture  Status: Abnormal   Collection Time: 05/03/20  2:16 PM   Specimen: Urine, Random  Result Value Ref Range Status   Specimen Description   Final    URINE, RANDOM Performed at [facility], [address] [place], [place] [address]    Special Requests   Final    NONE Performed at [facility], [address] [place], [place] [address]    Culture >=100,000 COLONIES/mL ESCHERICHIA COLI (A)  Final   Report Status 05/05/2020 FINAL  Final   Organism ID, Bacteria ESCHERICHIA COLI (A)  Final      Susceptibility   Escherichia coli - MIC*    AMPICILLIN 16 INTERMEDIATE Intermediate     CEFAZOLIN <=4 SENSITIVE Sensitive     CEFEPIME <=0.12 SENSITIVE Sensitive     CEFTRIAXONE <=0.25 SENSITIVE Sensitive     CIPROFLOXACIN >=4 RESISTANT Resistant     GENTAMICIN <=1 SENSITIVE Sensitive     IMIPENEM <=0.25 SENSITIVE Sensitive     NITROFURANTOIN 32 SENSITIVE Sensitive     TRIMETH/SULFA <=20 SENSITIVE Sensitive      AMPICILLIN/SULBACTAM 8 SENSITIVE Sensitive     PIP/TAZO <=4 SENSITIVE Sensitive     * >=100,000 COLONIES/mL ESCHERICHIA COLI  Resp Panel by RT-PCR (Flu A&B, Covid) Nasopharyngeal Swab     Status: None   Collection Time: 05/03/20  5:29 PM   Specimen: Nasopharyngeal Swab; Nasopharyngeal(NP) swabs in vial transport medium  Result Value Ref Range Status   SARS Coronavirus 2 by RT PCR NEGATIVE NEGATIVE Final    Comment: (NOTE) SARS-CoV-2 target nucleic acids are NOT DETECTED.  The SARS-CoV-2 RNA is generally detectable in upper respiratory specimens during the acute phase of infection. The lowest concentration of SARS-CoV-2 viral copies this assay can detect is 138 copies/mL. A negative result does not preclude SARS-Cov-2 infection and should not be used as the sole basis for treatment or other patient management decisions. A negative result may occur with  improper specimen collection/handling, submission of specimen other than nasopharyngeal swab, presence of viral mutation(s) within the areas targeted by this assay, and inadequate number of viral copies(<138 copies/mL). A negative result must be combined with clinical observations, patient history, and epidemiological information. The expected result is Negative.  Fact Sheet for Patients:  BloggerCourse.com  Fact Sheet for Healthcare Providers:  SeriousBroker.it  This test is no t yet approved or cleared by the [place] FDA and  has been authorized for detection and/or diagnosis of SARS-CoV-2 by FDA under an Emergency Use Authorization (EUA). This EUA will remain  in effect (meaning this test can be used) for the duration of the COVID-19 declaration under Section 564(b)(1) of the Act, 21 U.S.C.section 360bbb-3(b)(1), unless the authorization is terminated  or revoked sooner.       Influenza A by PCR NEGATIVE NEGATIVE Final   Influenza B by PCR NEGATIVE NEGATIVE Final     Comment: (NOTE) The Xpert Xpress SARS-CoV-2/FLU/RSV plus assay is intended as an aid in the diagnosis of influenza from Nasopharyngeal swab specimens and should not be used as a sole basis for treatment. Nasal washings and aspirates are unacceptable for Xpert Xpress SARS-CoV-2/FLU/RSV testing.  Fact Sheet for Patients: BloggerCourse.com  Fact Sheet for Healthcare Providers: SeriousBroker.it  This test is not yet approved or cleared by the [place] FDA and has been authorized for detection and/or diagnosis of SARS-CoV-2 by FDA under an Emergency Use Authorization (EUA). This EUA will remain in effect (meaning this test can be used) for the duration of the COVID-19  declaration under Section 564(b)(1) of the Act, 21 U.S.C. section 360bbb-3(b)(1), unless the authorization is terminated or revoked.  Performed at [facility], [address] [place], [place] [address]   Blood culture (routine x 2)     Status: None   Collection Time: 05/03/20  6:10 PM   Specimen: BLOOD  Result Value Ref Range Status   Specimen Description BLOOD LEFT ANTECUBITAL  Final   Special Requests   Final    BOTTLES DRAWN AEROBIC AND ANAEROBIC Blood Culture adequate volume   Culture   Final    NO GROWTH 5 DAYS Performed at [facility], [address] [place], [place] [address]    Report Status 05/08/2020 FINAL  Final  Blood culture (routine x 2)     Status: None   Collection Time: 05/03/20  6:23 PM   Specimen: BLOOD  Result Value Ref Range Status   Specimen Description BLOOD BLOOD LEFT HAND  Final   Special Requests   Final    BOTTLES DRAWN AEROBIC AND ANAEROBIC Blood Culture results may not be optimal due to an excessive volume of blood received in culture bottles   Culture   Final    NO GROWTH 5 DAYS Performed at [facility], [address], [place], [place] [address]    Report Status 05/08/2020 FINAL  Final      Studies: DG Chest Port 1 View  Result Date: 05/08/2020 CLINICAL DATA:  75 year old male with a history altered mental status, cough EXAM: PORTABLE CHEST 1 VIEW COMPARISON:  05/05/2020, 05/03/2020, 08/31/2018 FINDINGS: Cardiomediastinal silhouette unchanged in size and contour. Partial obscuration of the left hemidiaphragm and the left heart border persists secondary to opacity at the left lung base. Similar appearance of metallic shrapnel overlying the lower left chest, unchanged from the prior. Similar appearance of coarsened interstitial markings throughout the lungs with mild interlobular septal thickening. No pneumothorax.  Pleuroparenchymal thickening at the apices. Similar appearance of chronic deformity of the left humerus. Osteopenia. Chronic left sided rib deformities. IMPRESSION: Chronic lung changes and mild edema. Similar appearance of opacity at the left lung base, likely combination of atelectasis/consolidation, and known left-sided diaphragmatic hernia/eventration. Electronically Signed   By: Gilmer Mor D.O.   On: 05/08/2020 10:10    Scheduled Meds: . amoxicillin-clavulanate  1 tablet Oral Q12H  . apixaban  10 mg Oral BID   Followed by  . [START ON 05/12/2020] apixaban  5 mg Oral BID  . bisoprolol  5 mg Oral Daily  . carbamazepine  200 mg Oral BID  . [START ON 05/10/2020] furosemide  20 mg Oral Daily  . hydrocortisone   Topical QID  . ipratropium-albuterol  3 mL Nebulization Q6H  . lithium carbonate  150 mg Oral BID WC  . loratadine  10 mg Oral Daily  . lubiprostone  8 mcg Oral BID WC  . methylPREDNISolone (SOLU-MEDROL) injection  40 mg Intravenous Daily  . mometasone-formoterol  2 puff Inhalation BID  . risperiDONE  1 mg Oral Q8H    Assessment/Plan: 1. Acute hypoxic respiratory failure secondary to pulmonary embolism with fluid overload.  Room air saturation 86% on 05/05/2019.  Patient's lungs sound better today we will see if we can taper off oxygen. 2. Acute pulmonary  embolism.  Patient initially was placed on heparin drip and switched over to Eliquis. 3. Acute diastolic congestive heart failure with fluid overload.  With wheezing beta-blocker changed over to bisoprolol.  Low-dose Lasix. 4. COPD exacerbation with possible pneumonia.  I  switched antibiotics to Augmentin.  Nebulizer treatments.  Daily Solu-Medrol and hopefully quick taper.  Lungs sound better today. 5. Hemorrhoids.  Continue rectal steroid cream. 6. Constipation resolved with diarrhea after lactulose given. 7. Dementia and bipolar disorder on lithium and risperidone 8. Essential hypertension Toprol switched over to bisoprolol with wheeze. 9. Weakness.  Physical therapy recommends rehab. 10. E. coli UTI initially treated with IV Rocephin and switch over to Keflex but will finish up the course with Augmentin at this point.       Code Status:     Code Status Orders  (From admission, onward)         Start     Ordered   05/03/20 2143  Full code  Continuous        05/03/20 2143        Code Status History    Date Active Date Inactive Code Status Order ID Comments User Context   08/31/2018 1814 09/04/2018 1944 Full Code [id number]  Chauncey MannSchulz, Zachary R, PA-C ED   08/17/2016 0926 08/19/2016 1813 Full Code [id number]  Ihor AustinPyreddy, Pavan, MD Inpatient   03/12/2015 2128 03/14/2015 1845 Full Code [id number]  Houston SirenSainani, Vivek J, MD Inpatient   Advance Care Planning Activity     Disposition Plan: Status is: Inpatient  Dispo: The patient is from: Group home              Anticipated d/c is to: Rehab once bed available              Anticipated d/c date is: Rehab once bed available              Patient currently will be able to discharge once rehab bed available.  Time spent: 27 minutes  Akim Watkinson [employer]  [facility]

## 2020-05-10 DIAGNOSIS — I5031 Acute diastolic (congestive) heart failure: Secondary | ICD-10-CM | POA: Diagnosis not present

## 2020-05-10 DIAGNOSIS — I2699 Other pulmonary embolism without acute cor pulmonale: Secondary | ICD-10-CM | POA: Diagnosis not present

## 2020-05-10 DIAGNOSIS — J9601 Acute respiratory failure with hypoxia: Secondary | ICD-10-CM | POA: Diagnosis not present

## 2020-05-10 DIAGNOSIS — J441 Chronic obstructive pulmonary disease with (acute) exacerbation: Secondary | ICD-10-CM | POA: Diagnosis not present

## 2020-05-10 MED ORDER — IPRATROPIUM-ALBUTEROL 0.5-2.5 (3) MG/3ML IN SOLN: 3.0000 mL | RESPIRATORY_TRACT | Status: DC | PRN

## 2020-05-10 MED ORDER — BISOPROLOL FUMARATE 5 MG PO TABS
2.5000 mg | ORAL_TABLET | Freq: Every day | ORAL | Status: DC
  Administered 2020-05-10 – 2020-05-19 (×10): 2.5 mg via ORAL
  Filled 2020-05-10 (×10): qty 0.5

## 2020-05-10 MED ORDER — FUROSEMIDE 20 MG PO TABS
10.0000 mg | ORAL_TABLET | Freq: Every day | ORAL | Status: DC
  Administered 2020-05-10 – 2020-05-19 (×10): 10 mg via ORAL
  Filled 2020-05-10 (×10): qty 1

## 2020-05-10 NOTE — Progress Notes (Signed)
Patient ID: Cameron Ford, male   DOB: 08/13/1945, 75 y.o.   MRN: [id number] [facility] PROGRESS NOTE  Cameron Ford [id number]:[id number] DOB: 08/13/1945 DOA: 05/03/2020 PCP: Kaleen MaskElkins, Wilson Oliver, MD  HPI/Subjective: Patient feels okay.  Offers complaints of his hemorrhoid bothering him again.  No shortness of breath.  No chest pain.  Objective: Vitals:   05/10/20 1121 05/10/20 1514  BP: 95/72 118/83  Pulse: 71 70  Resp: 16 16  Temp: 98.1 F (36.7 C) 98.2 F (36.8 C)  SpO2: 94% 94%    Intake/Output Summary (Last 24 hours) at 05/10/2020 1555 Last data filed at 05/10/2020 1450 Gross per 24 hour  Intake 240 ml  Output 1100 ml  Net -860 ml   Filed Weights   05/08/20 0500 05/09/20 0503 05/10/20 0512  Weight: 77 kg 73.9 kg 73.9 kg    ROS: Review of Systems  Respiratory: Negative for shortness of breath.   Cardiovascular: Negative for chest pain.  Gastrointestinal: Negative for abdominal pain, nausea and vomiting.   Exam: Physical Exam HENT:     Head: Normocephalic.     Mouth/Throat:     Pharynx: No oropharyngeal exudate.  Eyes:     General: Lids are normal.     Conjunctiva/sclera: Conjunctivae normal.  Cardiovascular:     Rate and Rhythm: Normal rate and regular rhythm.     Heart sounds: Normal heart sounds, S1 normal and S2 normal.  Pulmonary:     Breath sounds: Examination of the right-lower field reveals decreased breath sounds. Examination of the left-lower field reveals decreased breath sounds. Decreased breath sounds present. No wheezing, rhonchi or rales.  Abdominal:     Palpations: Abdomen is soft.     Tenderness: There is no abdominal tenderness.  Musculoskeletal:     Right ankle: Swelling present.     Left ankle: Swelling present.  Skin:    General: Skin is warm.     Findings: No rash.  Neurological:     Mental Status: He is alert.       Data Reviewed: Basic Metabolic Panel: Recent Labs  Lab 05/04/20 0529 05/07/20 0355 05/09/20 0532  NA 138  137 134*  K 4.4 4.1 4.5  CL 102 91* 89*  CO2 27 36* 35*  GLUCOSE 106* 106* 118*  BUN 19 19 30*  CREATININE 0.96 0.80 0.75  CALCIUM 8.9 9.2 9.6  MG 1.7  --   --   PHOS 3.7  --   --    Liver Function Tests: Recent Labs  Lab 05/04/20 0529  AST 17  ALT 13  ALKPHOS 94  BILITOT 0.6  PROT 6.8  ALBUMIN 3.7   No results for input(s): LIPASE, AMYLASE in the last 168 hours. No results for input(s): AMMONIA in the last 168 hours. CBC: Recent Labs  Lab 05/04/20 0529 05/05/20 0812 05/07/20 0355 05/09/20 0532  WBC 8.2 7.1 6.7 7.6  HGB 10.6* 9.9* 11.4* 11.1*  HCT 32.6* 30.3* 34.9* 33.8*  MCV 102.8* 102.7* 102.3* 99.4  PLT 259 246 272 315   BNP (last 3 results) Recent Labs    05/03/20 1416  BNP 72.8      Recent Results (from the past 240 hour(s))  Urine Culture     Status: Abnormal   Collection Time: 05/03/20  2:16 PM   Specimen: Urine, Random  Result Value Ref Range Status   Specimen Description   Final    URINE, RANDOM Performed at [facility], [address] [place], [place] [address]  Special Requests   Final    NONE Performed at [facility], [address] [place], [place] [address]    Culture >=100,000 COLONIES/mL ESCHERICHIA COLI (A)  Final   Report Status 05/05/2020 FINAL  Final   Organism ID, Bacteria ESCHERICHIA COLI (A)  Final      Susceptibility   Escherichia coli - MIC*    AMPICILLIN 16 INTERMEDIATE Intermediate     CEFAZOLIN <=4 SENSITIVE Sensitive     CEFEPIME <=0.12 SENSITIVE Sensitive     CEFTRIAXONE <=0.25 SENSITIVE Sensitive     CIPROFLOXACIN >=4 RESISTANT Resistant     GENTAMICIN <=1 SENSITIVE Sensitive     IMIPENEM <=0.25 SENSITIVE Sensitive     NITROFURANTOIN 32 SENSITIVE Sensitive     TRIMETH/SULFA <=20 SENSITIVE Sensitive     AMPICILLIN/SULBACTAM 8 SENSITIVE Sensitive     PIP/TAZO <=4 SENSITIVE Sensitive     * >=100,000 COLONIES/mL ESCHERICHIA COLI  Resp Panel by RT-PCR (Flu A&B, Covid) Nasopharyngeal Swab      Status: None   Collection Time: 05/03/20  5:29 PM   Specimen: Nasopharyngeal Swab; Nasopharyngeal(NP) swabs in vial transport medium  Result Value Ref Range Status   SARS Coronavirus 2 by RT PCR NEGATIVE NEGATIVE Final    Comment: (NOTE) SARS-CoV-2 target nucleic acids are NOT DETECTED.  The SARS-CoV-2 RNA is generally detectable in upper respiratory specimens during the acute phase of infection. The lowest concentration of SARS-CoV-2 viral copies this assay can detect is 138 copies/mL. A negative result does not preclude SARS-Cov-2 infection and should not be used as the sole basis for treatment or other patient management decisions. A negative result may occur with  improper specimen collection/handling, submission of specimen other than nasopharyngeal swab, presence of viral mutation(s) within the areas targeted by this assay, and inadequate number of viral copies(<138 copies/mL). A negative result must be combined with clinical observations, patient history, and epidemiological information. The expected result is Negative.  Fact Sheet for Patients:  BloggerCourse.com  Fact Sheet for Healthcare Providers:  SeriousBroker.it  This test is no t yet approved or cleared by the [place] FDA and  has been authorized for detection and/or diagnosis of SARS-CoV-2 by FDA under an Emergency Use Authorization (EUA). This EUA will remain  in effect (meaning this test can be used) for the duration of the COVID-19 declaration under Section 564(b)(1) of the Act, 21 U.S.C.section 360bbb-3(b)(1), unless the authorization is terminated  or revoked sooner.       Influenza A by PCR NEGATIVE NEGATIVE Final   Influenza B by PCR NEGATIVE NEGATIVE Final    Comment: (NOTE) The Xpert Xpress SARS-CoV-2/FLU/RSV plus assay is intended as an aid in the diagnosis of influenza from Nasopharyngeal swab specimens and should not be used as a sole basis  for treatment. Nasal washings and aspirates are unacceptable for Xpert Xpress SARS-CoV-2/FLU/RSV testing.  Fact Sheet for Patients: BloggerCourse.com  Fact Sheet for Healthcare Providers: SeriousBroker.it  This test is not yet approved or cleared by the [place] FDA and has been authorized for detection and/or diagnosis of SARS-CoV-2 by FDA under an Emergency Use Authorization (EUA). This EUA will remain in effect (meaning this test can be used) for the duration of the COVID-19 declaration under Section 564(b)(1) of the Act, 21 U.S.C. section 360bbb-3(b)(1), unless the authorization is terminated or revoked.  Performed at [facility], [address] [place], [place] [address]   Blood culture (routine x 2)     Status: None   Collection Time:  05/03/20  6:10 PM   Specimen: BLOOD  Result Value Ref Range Status   Specimen Description BLOOD LEFT ANTECUBITAL  Final   Special Requests   Final    BOTTLES DRAWN AEROBIC AND ANAEROBIC Blood Culture adequate volume   Culture   Final    NO GROWTH 5 DAYS Performed at [facility], [address] [place], [place] [address]    Report Status 05/08/2020 FINAL  Final  Blood culture (routine x 2)     Status: None   Collection Time: 05/03/20  6:23 PM   Specimen: BLOOD  Result Value Ref Range Status   Specimen Description BLOOD BLOOD LEFT HAND  Final   Special Requests   Final    BOTTLES DRAWN AEROBIC AND ANAEROBIC Blood Culture results may not be optimal due to an excessive volume of blood received in culture bottles   Culture   Final    NO GROWTH 5 DAYS Performed at [facility] [address], [place], [place] [address]    Report Status 05/08/2020 FINAL  Final      Scheduled Meds: . amoxicillin-clavulanate  1 tablet Oral Q12H  . apixaban  10 mg Oral BID   Followed by  . [START ON 05/12/2020] apixaban  5 mg Oral BID  . bisoprolol  2.5 mg Oral Daily   . carbamazepine  200 mg Oral BID  . furosemide  10 mg Oral Daily  . hydrocortisone   Topical QID  . lithium carbonate  150 mg Oral BID WC  . loratadine  10 mg Oral Daily  . lubiprostone  8 mcg Oral BID WC  . methylPREDNISolone (SOLU-MEDROL) injection  40 mg Intravenous Daily  . mometasone-formoterol  2 puff Inhalation BID  . risperiDONE  1 mg Oral Q8H   Continuous Infusions:  Assessment/Plan:  1. Acute hypoxic respiratory failure secondary to pulmonary embolism with fluid overload.  Room air saturation 86% on 05/05/2019.  Trying to see if we can taper off oxygen. 2. Acute pulmonary embolism.  The patient was initially on heparin drip and then switched over to Eliquis orally. 3. Acute diastolic congestive heart failure with fluid overload.  Continue low-dose bisoprolol.  Lasix dose also decreased. 4. COPD exacerbation with possibility of pneumonia left lung base.  Continue Solu-Medrol and Augmentin both day 3 of 5 5. Hemorrhoids.  Continue rectal steroid cream 6. Constipation resolved with lactulose previously 7. Dementia and bipolar disorder on lithium and risperidone 8. Essential hypertension on low-dose bisoprolol and Lasix 9. Weakness.  Physical therapy recommends rehab 10. E. coli UTI treated with IV Rocephin and switched over to Keflex but now finishing up course with Augmentin at this point.     Code Status:     Code Status Orders  (From admission, onward)         Start     Ordered   05/03/20 2143  Full code  Continuous        05/03/20 2143        Code Status History    Date Active Date Inactive Code Status Order ID Comments User Context   08/31/2018 1814 09/04/2018 1944 Full Code [id number]  Chauncey Mann ED   08/17/2016 0926 08/19/2016 1813 Full Code [id number]  Ihor Austin, MD Inpatient   03/12/2015 2128 03/14/2015 1845 Full Code [id number]  Houston Siren, MD Inpatient   Advance Care Planning Activity     Disposition Plan: Status is:  Inpatient  Dispo: The patient is from: Home  Anticipated d/c is to: Rehab              Anticipated d/c date is: Unclear at this point because there are no rehab beds available yet so far.              Patient currently medically stable to go out to rehab once bed available.  Once we get a bed I will send off a Covid test.  Time spent: 26 minutes  [employer]ada [employer]
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
## 2020-05-11 DIAGNOSIS — J9601 Acute respiratory failure with hypoxia: Secondary | ICD-10-CM | POA: Diagnosis not present

## 2020-05-11 DIAGNOSIS — J441 Chronic obstructive pulmonary disease with (acute) exacerbation: Secondary | ICD-10-CM | POA: Diagnosis not present

## 2020-05-11 DIAGNOSIS — I5031 Acute diastolic (congestive) heart failure: Secondary | ICD-10-CM | POA: Diagnosis not present

## 2020-05-11 DIAGNOSIS — I2699 Other pulmonary embolism without acute cor pulmonale: Secondary | ICD-10-CM | POA: Diagnosis not present

## 2020-05-11 DIAGNOSIS — F0390 Unspecified dementia without behavioral disturbance: Secondary | ICD-10-CM

## 2020-05-11 MED ORDER — PREDNISONE 20 MG PO TABS
40.0000 mg | ORAL_TABLET | Freq: Every day | ORAL | Status: DC
  Administered 2020-05-12: 40 mg via ORAL
  Filled 2020-05-11: qty 2

## 2020-05-11 MED ORDER — PREDNISONE 20 MG PO TABS
40.0000 mg | ORAL_TABLET | Freq: Every day | ORAL | Status: DC
  Administered 2020-05-11 – 2020-05-12 (×2): 40 mg via ORAL
  Filled 2020-05-11 (×2): qty 2

## 2020-05-11 NOTE — Care Management Important Message (Signed)
Important Message  Patient Details  Name: Cameron Ford MRN: [id number] Date of Birth: Aug 08, 1945   Medicare Important Message Given:  Yes     Johnell Comings 05/11/2020, 11:15 AM
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
## 2020-05-11 NOTE — Progress Notes (Signed)
Patient ID: Cameron Ford, male   DOB: [birth year], 75 y.o.   MRN: [id number] [facility] PROGRESS NOTE  NISSIM FLEISCHER MWU:[id number] DOB: 1946/03/07 DOA: 05/03/2020 PCP: Kaleen Mask, MD  HPI/Subjective: Patient feels okay.  Offers no complaints again asked about when he can get out of here.  No shortness of breath or cough.  His hemorrhoid is not bothering him.  Objective: Vitals:   05/11/20 0802 05/11/20 1131  BP: (!) 114/51 (!) 101/46  Pulse: 60 62  Resp: 19 20  Temp: 98 F (36.7 C) 97.7 F (36.5 C)  SpO2: 99% 96%    Intake/Output Summary (Last 24 hours) at 05/11/2020 1418 Last data filed at 05/11/2020 1058 Gross per 24 hour  Intake 240 ml  Output 1050 ml  Net -810 ml   Filed Weights   05/08/20 0500 05/09/20 0503 05/10/20 0512  Weight: 77 kg 73.9 kg 73.9 kg    ROS: Review of Systems  Respiratory: Negative for shortness of breath.   Cardiovascular: Negative for chest pain.  Gastrointestinal: Negative for abdominal pain, nausea and vomiting.   Exam: Physical Exam HENT:     Head: Normocephalic.     Mouth/Throat:     Pharynx: No oropharyngeal exudate.  Eyes:     General: Lids are normal.     Conjunctiva/sclera: Conjunctivae normal.     Pupils: Pupils are equal, round, and reactive to light.  Cardiovascular:     Rate and Rhythm: Normal rate and regular rhythm.     Heart sounds: Normal heart sounds, S1 normal and S2 normal.  Pulmonary:     Breath sounds: Examination of the right-lower field reveals decreased breath sounds. Examination of the left-lower field reveals decreased breath sounds. Decreased breath sounds present. No wheezing, rhonchi or rales.  Abdominal:     Palpations: Abdomen is soft.     Tenderness: There is no abdominal tenderness.  Musculoskeletal:     Right ankle: No swelling.     Left ankle: No swelling.  Skin:    General: Skin is warm.     Findings: No rash.  Neurological:     Mental Status: He is alert.     Comments:  Answers questions.       Data Reviewed: Basic Metabolic Panel: Recent Labs  Lab 05/07/20 0355 05/09/20 0532  NA 137 134*  K 4.1 4.5  CL 91* 89*  CO2 36* 35*  GLUCOSE 106* 118*  BUN 19 30*  CREATININE 0.80 0.75  CALCIUM 9.2 9.6   CBC: Recent Labs  Lab 05/05/20 0812 05/07/20 0355 05/09/20 0532  WBC 7.1 6.7 7.6  HGB 9.9* 11.4* 11.1*  HCT 30.3* 34.9* 33.8*  MCV 102.7* 102.3* 99.4  PLT 246 272 315   BNP (last 3 results) Recent Labs    05/03/20 1416  BNP 72.8      Recent Results (from the past 240 hour(s))  Urine Culture     Status: Abnormal   Collection Time: 05/03/20  2:16 PM   Specimen: Urine, Random  Result Value Ref Range Status   Specimen Description   Final    URINE, RANDOM Performed at [facility], [address], [place], [place] [address]    Special Requests   Final    NONE Performed at [facility], [address] [place], [place] [address]    Culture >=100,000 COLONIES/mL ESCHERICHIA COLI (A)  Final   Report Status 05/05/2020 FINAL  Final   Organism ID, Bacteria ESCHERICHIA COLI (A)  Final  Susceptibility   Escherichia coli - MIC*    AMPICILLIN 16 INTERMEDIATE Intermediate     CEFAZOLIN <=4 SENSITIVE Sensitive     CEFEPIME <=0.12 SENSITIVE Sensitive     CEFTRIAXONE <=0.25 SENSITIVE Sensitive     CIPROFLOXACIN >=4 RESISTANT Resistant     GENTAMICIN <=1 SENSITIVE Sensitive     IMIPENEM <=0.25 SENSITIVE Sensitive     NITROFURANTOIN 32 SENSITIVE Sensitive     TRIMETH/SULFA <=20 SENSITIVE Sensitive     AMPICILLIN/SULBACTAM 8 SENSITIVE Sensitive     PIP/TAZO <=4 SENSITIVE Sensitive     * >=100,000 COLONIES/mL ESCHERICHIA COLI  Resp Panel by RT-PCR (Flu A&B, Covid) Nasopharyngeal Swab     Status: None   Collection Time: 05/03/20  5:29 PM   Specimen: Nasopharyngeal Swab; Nasopharyngeal(NP) swabs in vial transport medium  Result Value Ref Range Status   SARS Coronavirus 2 by RT PCR NEGATIVE NEGATIVE Final    Comment:  (NOTE) SARS-CoV-2 target nucleic acids are NOT DETECTED.  The SARS-CoV-2 RNA is generally detectable in upper respiratory specimens during the acute phase of infection. The lowest concentration of SARS-CoV-2 viral copies this assay can detect is 138 copies/mL. A negative result does not preclude SARS-Cov-2 infection and should not be used as the sole basis for treatment or other patient management decisions. A negative result may occur with  improper specimen collection/handling, submission of specimen other than nasopharyngeal swab, presence of viral mutation(s) within the areas targeted by this assay, and inadequate number of viral copies(<138 copies/mL). A negative result must be combined with clinical observations, patient history, and epidemiological information. The expected result is Negative.  Fact Sheet for Patients:  BloggerCourse.comhttps://www.fda.gov/media/152166/download  Fact Sheet for Healthcare Providers:  SeriousBroker.ithttps://www.fda.gov/media/152162/download  This test is no t yet approved or cleared by the [place] FDA and  has been authorized for detection and/or diagnosis of SARS-CoV-2 by FDA under an Emergency Use Authorization (EUA). This EUA will remain  in effect (meaning this test can be used) for the duration of the COVID-19 declaration under Section 564(b)(1) of the Act, 21 U.S.C.section 360bbb-3(b)(1), unless the authorization is terminated  or revoked sooner.       Influenza A by PCR NEGATIVE NEGATIVE Final   Influenza B by PCR NEGATIVE NEGATIVE Final    Comment: (NOTE) The Xpert Xpress SARS-CoV-2/FLU/RSV plus assay is intended as an aid in the diagnosis of influenza from Nasopharyngeal swab specimens and should not be used as a sole basis for treatment. Nasal washings and aspirates are unacceptable for Xpert Xpress SARS-CoV-2/FLU/RSV testing.  Fact Sheet for Patients: BloggerCourse.comhttps://www.fda.gov/media/152166/download  Fact Sheet for Healthcare  Providers: SeriousBroker.ithttps://www.fda.gov/media/152162/download  This test is not yet approved or cleared by the [place] FDA and has been authorized for detection and/or diagnosis of SARS-CoV-2 by FDA under an Emergency Use Authorization (EUA). This EUA will remain in effect (meaning this test can be used) for the duration of the COVID-19 declaration under Section 564(b)(1) of the Act, 21 U.S.C. section 360bbb-3(b)(1), unless the authorization is terminated or revoked.  Performed at [facility], [address] [place], [place] [address]   Blood culture (routine x 2)     Status: None   Collection Time: 05/03/20  6:10 PM   Specimen: BLOOD  Result Value Ref Range Status   Specimen Description BLOOD LEFT ANTECUBITAL  Final   Special Requests   Final    BOTTLES DRAWN AEROBIC AND ANAEROBIC Blood Culture adequate volume   Culture   Final    NO GROWTH 5 DAYS  Performed at [facility], [address] [place], [place] [address]    Report Status 05/08/2020 FINAL  Final  Blood culture (routine x 2)     Status: None   Collection Time: 05/03/20  6:23 PM   Specimen: BLOOD  Result Value Ref Range Status   Specimen Description BLOOD BLOOD LEFT HAND  Final   Special Requests   Final    BOTTLES DRAWN AEROBIC AND ANAEROBIC Blood Culture results may not be optimal due to an excessive volume of blood received in culture bottles   Culture   Final    NO GROWTH 5 DAYS Performed at [facility], [address] [place], [place] [address]    Report Status 05/08/2020 FINAL  Final      Scheduled Meds: . amoxicillin-clavulanate  1 tablet Oral Q12H  . apixaban  10 mg Oral BID   Followed by  . [START ON 05/12/2020] apixaban  5 mg Oral BID  . bisoprolol  2.5 mg Oral Daily  . carbamazepine  200 mg Oral BID  . furosemide  10 mg Oral Daily  . hydrocortisone   Topical QID  . lithium carbonate  150 mg Oral BID WC  . loratadine  10 mg Oral Daily  . lubiprostone  8 mcg Oral BID  WC  . mometasone-formoterol  2 puff Inhalation BID  . [START ON 05/12/2020] predniSONE  40 mg Oral Q breakfast  . predniSONE  40 mg Oral Q breakfast  . risperiDONE  1 mg Oral Q8H     Assessment/Plan:  1. Acute hypoxic respiratory failure secondary to pulmonary embolism with fluid overload.  Room air saturation 86% on 05/05/2019.  Currently still on oxygen 3 L. 2. Acute pulmonary embolism posterior left lower lobe with right heart strain.  Was initially on heparin drip but now on Eliquis. 3. Acute diastolic congestive heart failure with fluid overload.  On low-dose bisoprolol and Lasix. 4. COPD exacerbation with possible pneumonia of the left lung base.  On prednisone and Augmentin both day 4 out of 5. 5. Hemorrhoids.  Continue rectal cream. 6. Dementia and bipolar disorder on lithium and risperidone 7. Essential hypertension on low-dose bisoprolol and Lasix 8. Weakness physical therapy recommends rehab 9. E. coli UTI treated previously with Rocephin and switched over to Keflex but once pneumonia diagnosis I switched over to Augmentin. 10. Will get palliative care consultation     Code Status:     Code Status Orders  (From admission, onward)         Start     Ordered   05/03/20 2143  Full code  Continuous        05/03/20 2143        Code Status History    Date Active Date Inactive Code Status Order ID Comments User Context   08/31/2018 1814 09/04/2018 1944 Full Code [id number]  Chauncey Mann ED   08/17/2016 0926 08/19/2016 1813 Full Code [id number]  Ihor Austin, MD Inpatient   03/12/2015 2128 03/14/2015 1845 Full Code [id number]  Houston Siren, MD Inpatient   Advance Care Planning Activity     Family Communication: Spoke with guardian about no bed offers at rehab for him. Disposition Plan: Status is: Inpatient  Dispo: The patient is from: Group home              Anticipated d/c is to: Rehab              Anticipated d/c date is: As per TOC,  no rehab beds for him at  this point.              Patient currently can go to rehab once bed obtained  Time spent: 27 minutes  Rondi [employer]

## 2020-05-12 DIAGNOSIS — J9601 Acute respiratory failure with hypoxia: Secondary | ICD-10-CM | POA: Diagnosis not present

## 2020-05-12 DIAGNOSIS — J441 Chronic obstructive pulmonary disease with (acute) exacerbation: Secondary | ICD-10-CM | POA: Diagnosis not present

## 2020-05-12 DIAGNOSIS — I5031 Acute diastolic (congestive) heart failure: Secondary | ICD-10-CM | POA: Diagnosis not present

## 2020-05-12 DIAGNOSIS — I2699 Other pulmonary embolism without acute cor pulmonale: Secondary | ICD-10-CM | POA: Diagnosis not present

## 2020-05-12 LAB — BASIC METABOLIC PANEL
Anion gap: 9 (ref 5–15)
BUN: 25 mg/dL — ABNORMAL HIGH (ref 8–23)
CO2: 33 mmol/L — ABNORMAL HIGH (ref 22–32)
Calcium: 9.5 mg/dL (ref 8.9–10.3)
Chloride: 91 mmol/L — ABNORMAL LOW (ref 98–111)
Creatinine, Ser: 0.78 mg/dL (ref 0.61–1.24)
GFR, Estimated: >60 mL/min (ref >60)
Glucose, Bld: 99 mg/dL (ref 70–99)
Potassium: 4.5 mmol/L (ref 3.5–5.1)
Sodium: 133 mmol/L — ABNORMAL LOW (ref 135–145)

## 2020-05-12 LAB — CBC
HCT: 33 % — ABNORMAL LOW (ref 39.0–52.0)
Hemoglobin: 10.7 g/dL — ABNORMAL LOW (ref 13.0–17.0)
MCH: 32.8 pg (ref 26.0–34.0)
MCHC: 32.4 g/dL (ref 30.0–36.0)
MCV: 101.2 fL — ABNORMAL HIGH (ref 80.0–100.0)
Platelets: 363 10*3/uL (ref 150–400)
RBC: 3.26 MIL/uL — ABNORMAL LOW (ref 4.22–5.81)
RDW: 14.2 % (ref 11.5–15.5)
WBC: 6.5 10*3/uL (ref 4.0–10.5)
nRBC: 0 % (ref 0.0–0.2)

## 2020-05-12 NOTE — Progress Notes (Signed)
Patient ID: Cameron Ford, male   DOB: 1945-11-14, 75 y.o.   MRN: [id number] [facility] PROGRESS NOTE  Cameron Ford WEX:[id number] DOB: [birth year] DOA: 05/03/2020 PCP: Kaleen Mask, MD  HPI/Subjective: Patient awakened from sleep this morning.  Feels okay.  Offers no complaints.  His hemorrhoid feels okay.  Breathing okay.  Initially admitted from the group home with confusion.  Objective: Vitals:   05/12/20 0752 05/12/20 1127  BP: (!) 98/52 (!) 92/46  Pulse: 61 68  Resp: 16 15  Temp: 98.8 F (37.1 C) 97.7 F (36.5 C)  SpO2: 93% 92%    Intake/Output Summary (Last 24 hours) at 05/12/2020 1535 Last data filed at 05/12/2020 0428 Gross per 24 hour  Intake 60 ml  Output 500 ml  Net -440 ml   Filed Weights   05/09/20 0503 05/10/20 0512 05/12/20 0427  Weight: 73.9 kg 73.9 kg 75.1 kg    ROS: Review of Systems  Respiratory: Negative for shortness of breath.   Cardiovascular: Negative for chest pain.  Gastrointestinal: Negative for abdominal pain, nausea and vomiting.   Exam: Physical Exam HENT:     Head: Normocephalic.     Mouth/Throat:     Pharynx: No oropharyngeal exudate.  Eyes:     General: Lids are normal.     Conjunctiva/sclera: Conjunctivae normal.     Pupils: Pupils are equal, round, and reactive to light.  Cardiovascular:     Rate and Rhythm: Normal rate and regular rhythm.     Heart sounds: Normal heart sounds, S1 normal and S2 normal.  Pulmonary:     Breath sounds: Examination of the right-lower field reveals decreased breath sounds. Examination of the left-lower field reveals decreased breath sounds. Decreased breath sounds present. No wheezing, rhonchi or rales.  Abdominal:     Palpations: Abdomen is soft.     Tenderness: There is no abdominal tenderness.  Musculoskeletal:     Right lower leg: No swelling.     Left lower leg: No swelling.  Skin:    General: Skin is warm.     Findings: No rash.  Neurological:     Mental Status: He is  alert and oriented to person, place, and time.       Data Reviewed: Basic Metabolic Panel: Recent Labs  Lab 05/07/20 0355 05/09/20 0532 05/12/20 0533  NA 137 134* 133*  K 4.1 4.5 4.5  CL 91* 89* 91*  CO2 36* 35* 33*  GLUCOSE 106* 118* 99  BUN 19 30* 25*  CREATININE 0.80 0.75 0.78  CALCIUM 9.2 9.6 9.5   CBC: Recent Labs  Lab 05/07/20 0355 05/09/20 0532 05/12/20 0533  WBC 6.7 7.6 6.5  HGB 11.4* 11.1* 10.7*  HCT 34.9* 33.8* 33.0*  MCV 102.3* 99.4 101.2*  PLT 272 315 363  BNP (last 3 results) Recent Labs    05/03/20 1416  BNP 72.8     Recent Results (from the past 240 hour(s))  Urine Culture     Status: Abnormal   Collection Time: 05/03/20  2:16 PM   Specimen: Urine, Random  Result Value Ref Range Status   Specimen Description   Final    URINE, RANDOM Performed at [facility], [address] [place], [place] [address]    Special Requests   Final    NONE Performed at [facility], [address], [place], [place] [address]    Culture >=100,000 COLONIES/mL ESCHERICHIA COLI (A)  Final   Report Status 05/05/2020 FINAL  Final  Organism ID, Bacteria ESCHERICHIA COLI (A)  Final      Susceptibility   Escherichia coli - MIC*    AMPICILLIN 16 INTERMEDIATE Intermediate     CEFAZOLIN <=4 SENSITIVE Sensitive     CEFEPIME <=0.12 SENSITIVE Sensitive     CEFTRIAXONE <=0.25 SENSITIVE Sensitive     CIPROFLOXACIN >=4 RESISTANT Resistant     GENTAMICIN <=1 SENSITIVE Sensitive     IMIPENEM <=0.25 SENSITIVE Sensitive     NITROFURANTOIN 32 SENSITIVE Sensitive     TRIMETH/SULFA <=20 SENSITIVE Sensitive     AMPICILLIN/SULBACTAM 8 SENSITIVE Sensitive     PIP/TAZO <=4 SENSITIVE Sensitive     * >=100,000 COLONIES/mL ESCHERICHIA COLI  Resp Panel by RT-PCR (Flu A&B, Covid) Nasopharyngeal Swab     Status: None   Collection Time: 05/03/20  5:29 PM   Specimen: Nasopharyngeal Swab; Nasopharyngeal(NP) swabs in vial transport medium  Result Value Ref Range  Status   SARS Coronavirus 2 by RT PCR NEGATIVE NEGATIVE Final    Comment: (NOTE) SARS-CoV-2 target nucleic acids are NOT DETECTED.  The SARS-CoV-2 RNA is generally detectable in upper respiratory specimens during the acute phase of infection. The lowest concentration of SARS-CoV-2 viral copies this assay can detect is 138 copies/mL. A negative result does not preclude SARS-Cov-2 infection and should not be used as the sole basis for treatment or other patient management decisions. A negative result may occur with  improper specimen collection/handling, submission of specimen other than nasopharyngeal swab, presence of viral mutation(s) within the areas targeted by this assay, and inadequate number of viral copies(<138 copies/mL). A negative result must be combined with clinical observations, patient history, and epidemiological information. The expected result is Negative.  Fact Sheet for Patients:  BloggerCourse.com  Fact Sheet for Healthcare Providers:  SeriousBroker.it  This test is no t yet approved or cleared by the [place] FDA and  has been authorized for detection and/or diagnosis of SARS-CoV-2 by FDA under an Emergency Use Authorization (EUA). This EUA will remain  in effect (meaning this test can be used) for the duration of the COVID-19 declaration under Section 564(b)(1) of the Act, 21 U.S.C.section 360bbb-3(b)(1), unless the authorization is terminated  or revoked sooner.       Influenza A by PCR NEGATIVE NEGATIVE Final   Influenza B by PCR NEGATIVE NEGATIVE Final    Comment: (NOTE) The Xpert Xpress SARS-CoV-2/FLU/RSV plus assay is intended as an aid in the diagnosis of influenza from Nasopharyngeal swab specimens and should not be used as a sole basis for treatment. Nasal washings and aspirates are unacceptable for Xpert Xpress SARS-CoV-2/FLU/RSV testing.  Fact Sheet for  Patients: BloggerCourse.com  Fact Sheet for Healthcare Providers: SeriousBroker.it  This test is not yet approved or cleared by the [place] FDA and has been authorized for detection and/or diagnosis of SARS-CoV-2 by FDA under an Emergency Use Authorization (EUA). This EUA will remain in effect (meaning this test can be used) for the duration of the COVID-19 declaration under Section 564(b)(1) of the Act, 21 U.S.C. section 360bbb-3(b)(1), unless the authorization is terminated or revoked.  Performed at [facility], [address] [place], [place] [address]   Blood culture (routine x 2)     Status: None   Collection Time: 05/03/20  6:10 PM   Specimen: BLOOD  Result Value Ref Range Status   Specimen Description BLOOD LEFT ANTECUBITAL  Final   Special Requests   Final    BOTTLES DRAWN AEROBIC AND ANAEROBIC Blood Culture adequate volume  Culture   Final    NO GROWTH 5 DAYS Performed at [facility], [address] [place], [place] [address]    Report Status 05/08/2020 FINAL  Final  Blood culture (routine x 2)     Status: None   Collection Time: 05/03/20  6:23 PM   Specimen: BLOOD  Result Value Ref Range Status   Specimen Description BLOOD BLOOD LEFT HAND  Final   Special Requests   Final    BOTTLES DRAWN AEROBIC AND ANAEROBIC Blood Culture results may not be optimal due to an excessive volume of blood received in culture bottles   Culture   Final    NO GROWTH 5 DAYS Performed at [facility], [address] [place], [place] [address]    Report Status 05/08/2020 FINAL  Final      Scheduled Meds: . amoxicillin-clavulanate  1 tablet Oral Q12H  . apixaban  5 mg Oral BID  . bisoprolol  2.5 mg Oral Daily  . carbamazepine  200 mg Oral BID  . furosemide  10 mg Oral Daily  . hydrocortisone   Topical QID  . lithium carbonate  150 mg Oral BID WC  . loratadine  10 mg Oral Daily  .  lubiprostone  8 mcg Oral BID WC  . mometasone-formoterol  2 puff Inhalation BID  . predniSONE  40 mg Oral Q breakfast  . risperiDONE  1 mg Oral Q8H   Brief history.  Patient from a group home and was admitted with confusion.  Initially diagnosed with UTI and was on Rocephin.  E. coli grew out of the urine culture.  Physical therapy recommends rehab.  Unfortunately no rehab beds available.  The patient's guardian will also look into other options.  The patient also diagnosed with acute pulmonary embolism with right heart strain and is now on Eliquis.  Also has acute on chronic hypoxic respiratory failure on 2 L of oxygen.  Also diagnosed with COPD exacerbation possible pneumonia and will finish up prednisone and Augmentin.  I also did give low-dose bisoprolol and Lasix for diastolic congestive heart failure.  Placed palliative care consultation. Assessment/Plan:  1. Acute hypoxic respiratory failure secondary to pulmonary embolism and fluid overload.  Room air saturation 86% on 05/05/2019.  Currently on oxygen. 2. Acute pulmonary embolism, posterior left lower lobe with right heart strain.  Initially on heparin drip but now on Eliquis. 3. Acute diastolic congestive heart failure with fluid overload.  We did give IV Lasix a few days and now on oral Lasix and low-dose bisoprolol. 4. COPD exacerbation with possible pneumonia of the left lung base.  On prednisone and Augmentin both will complete today. 5. Hemorrhoids.  Continue rectal cream. 6. Dementia without behavioral disturbance and bipolar disorder on lithium and risperidone. 7. Essential hypertension.  Continue bisoprolol and Lasix low-dose. 8. Weakness.  Physical therapy recommends rehab. 9. E. coli UTI previously treated with Rocephin and switched over to Keflex but is once possible pneumonia I did switch over to Augmentin.  Antibiotics will finish today. 10. Palliative care consultation since still full code.    Code Status:     Code Status  Orders  (From admission, onward)         Start     Ordered   05/03/20 2143  Full code  Continuous        05/03/20 2143        Code Status History    Date Active Date Inactive Code Status Order ID Comments User  Context   08/31/2018 1814 09/04/2018 1944 Full Code [id number]  Chauncey Mann ED   08/17/2016 0926 08/19/2016 1813 Full Code [id number]  Ihor Austin, MD Inpatient   03/12/2015 2128 03/14/2015 1845 Full Code [id number]  Houston Siren, MD Inpatient   Advance Care Planning Activity     Family Communication: Spoke with guardian yesterday about no bed availability's at this point.  Call if Disposition Plan: Status is: Inpatient  Dispo:  Patient From: Group Home  Planned Disposition: Skilled Nursing Facility  Expected discharge date: Will need an accepting rehab facility  Medically stable for discharge: Yes  Time spent: 27 minutes  Clary [employer]

## 2020-05-12 NOTE — Progress Notes (Signed)
Occupational Therapy Treatment Patient Details Name: Cameron Ford MRN: [id number] DOB: [birth year] Today's Date: 05/12/2020    History of present illness Cameron Ford is a 75 y.o. male with medical history significant for dementia, type I bipolar disorder, benign essential tremors, hypertension, moderate persistent asthma who is admitted to [facility] on 05/03/2020 with acute hypoxic respiratory distress in the setting of acute pulmonary embolism after presenting from group home to [facility] Emergency Department for evaluation of confusion   OT comments  Cameron Ford was seen for OT treatment on this date. Upon arrival to room pt semi-supine in bed. Agreeable to OT tx session, states he would like to sit up "to watch for my sister's car out the window". Pt remains generally confused t/o session, but participatory in therapy. OT facilitates ADL tasks as described below. Pt requires MAX A to don bilateral hospital socks as well as MAX A to engage in bed mobility. See ADL section below for additional details regarding occupational performance. Pt remains on 3L  t/o session with spO2 remaining WFL (>/= 92% t/o session). Pt continues to demo decreased cognition, safety awareness, and activity tolerance, however he is progressing with OT goals and continues to benefit from skilled OT services to maximize return to PLOF and minimize risk of future falls, injury, caregiver burden, and readmission. Will continue to follow POC as written. Discharge recommendation remains appropriate.    Follow Up Recommendations  SNF;Supervision/Assistance - 24 hour    Equipment Recommendations  Other (comment) (Defer to next venue of care.)    Recommendations for Other Services      Precautions / Restrictions Precautions Precautions: Fall Restrictions Weight Bearing Restrictions: No       Mobility Bed Mobility Overal bed mobility: Needs Assistance Bed Mobility: Supine to Sit;Sit to  Supine;Rolling Rolling: Min assist   Supine to sit: Max assist Sit to supine: Max assist   General bed mobility comments: max assist for LE management, truncal elevation. requires cueing for log-roll technique with limited success.  Transfers                      Balance Overall balance assessment: Needs assistance Sitting-balance support: Feet supported;Single extremity supported Sitting balance-Leahy Scale: Poor Sitting balance - Comments: very kyphotic, forward flexed posture; lists L, absent attempts at correction; min/mod assist to maintain sitting at EOB. Postural control: Posterior lean;Left lateral lean                                 ADL either performed or assessed with clinical judgement   ADL Overall ADL's : Needs assistance/impaired Eating/Feeding: Supervision/ safety;Set up;Bed level Eating/Feeding Details (indicate cue type and reason): Pt demos improved cognition this date. He is able to engage in cup drinking (with straw) independently during session. Anticipate set-up/supervision for some meal tray items at bed level.                                   General ADL Comments: Pt requires MAX A for bed mobility with consistent cueing for safety/sequencing.     Vision Patient Visual Report: No change from baseline     Perception     Praxis      Cognition Arousal/Alertness: Awake/alert Behavior During Therapy: WFL for tasks assessed/performed Overall Cognitive Status: No family/caregiver present to determine baseline cognitive functioning  General Comments: Pt oriented to self only, able to follow VCs with multimodal cueing.        Exercises Other Exercises Other Exercises: OT facilitates bed mobility and LB dressing task this date. Pt requires MIN A to roll R<>L in bed for linnen change, MAX A to don bilat hospital socks, and MAX A for sup<>sit t/f to EOB with consistent  cueing for safety/sequencing t/o session. Decr. sitting tolerance noted despite cueing and physical assist to maintain upright position at EOB.   Shoulder Instructions       General Comments      Pertinent Vitals/ Pain       Pain Assessment: Faces Faces Pain Scale: Hurts little more Pain Location: Low back pain Pain Descriptors / Indicators: Aching;Sore Pain Intervention(s): Limited activity within patient's tolerance;Repositioned  Home Living                                          Prior Functioning/Environment              Frequency  Min 1X/week        Progress Toward Goals  OT Goals(current goals can now be found in the care plan section)  Progress towards OT goals: Progressing toward goals  Acute Rehab OT Goals Patient Stated Goal: To be left alone OT Goal Formulation: With patient Time For Goal Achievement: 05/19/20 Potential to Achieve Goals: Fair  Plan Discharge plan remains appropriate;Frequency remains appropriate    Co-evaluation                 AM-PAC OT "6 Clicks" Daily Activity     Outcome Measure   Help from another person eating meals?: A Little Help from another person taking care of personal grooming?: A Little Help from another person toileting, which includes using toliet, bedpan, or urinal?: A Lot Help from another person bathing (including washing, rinsing, drying)?: A Lot Help from another person to put on and taking off regular upper body clothing?: A Lot Help from another person to put on and taking off regular lower body clothing?: A Lot 6 Click Score: 14    End of Session Equipment Utilized During Treatment: Oxygen  OT Visit Diagnosis: Other abnormalities of gait and mobility (R26.89);Muscle weakness (generalized) (M62.81)   Activity Tolerance Patient tolerated treatment well   Patient Left in bed;with call bell/phone within reach;with nursing/sitter in room   Nurse Communication Mobility status         Time: 1610-9604 OT Time Calculation (min): 17 min  Charges: OT General Charges $OT Visit: 1 Visit OT Treatments $Therapeutic Activity: 8-22 mins  Rockney Ghee, M.S., OTR/L Ascom: [phone] 05/12/20, 3:14 PM

## 2020-05-12 NOTE — TOC Progression Note (Signed)
Transition of Care [facility]) - Progression Note    Patient Details  Name: Cameron Ford MRN: [id number] Date of Birth: 03-08-1946  Transition of Care [facility]) CM/SW Contact  Trenton Founds, RN Phone Number: 05/12/2020, 7:24 AM  Clinical Narrative:   RNCM reached out to [facility] with [employer] and [place] with [facility] to request they look at patient to see if they can offer bed.          Expected Discharge Plan and Services                                                 Social Determinants of Health (SDOH) Interventions    Readmission Risk Interventions No flowsheet data found.

## 2020-05-12 NOTE — TOC Progression Note (Signed)
Transition of Care [facility]) - Progression Note    Patient Details  Name: Cameron Ford MRN: [id number] Date of Birth: 29-Mar-1946  Transition of Care [facility]) CM/SW Contact  Trenton Founds, RN Phone Number: 05/12/2020, 3:25 PM  Clinical Narrative:   Communication with guardian about lack of available bed offers. She will reach out and make some phone calls to look for availability.           Expected Discharge Plan and Services                                                 Social Determinants of Health (SDOH) Interventions    Readmission Risk Interventions No flowsheet data found.

## 2020-05-13 DIAGNOSIS — I5031 Acute diastolic (congestive) heart failure: Secondary | ICD-10-CM | POA: Diagnosis not present

## 2020-05-13 DIAGNOSIS — G9341 Metabolic encephalopathy: Secondary | ICD-10-CM | POA: Diagnosis not present

## 2020-05-13 DIAGNOSIS — I2699 Other pulmonary embolism without acute cor pulmonale: Secondary | ICD-10-CM | POA: Diagnosis not present

## 2020-05-13 LAB — BRAIN NATRIURETIC PEPTIDE: B Natriuretic Peptide: 39.1 pg/mL (ref 0.0–100.0)

## 2020-05-13 MED ORDER — TIOTROPIUM BROMIDE MONOHYDRATE 18 MCG IN CAPS
18.0000 ug | ORAL_CAPSULE | Freq: Every day | RESPIRATORY_TRACT | Status: DC
  Administered 2020-05-13 – 2020-05-19 (×7): 18 ug via RESPIRATORY_TRACT
  Filled 2020-05-13 (×2): qty 5

## 2020-05-13 NOTE — Progress Notes (Signed)
PMT consult received and chart reviewed. Visited with patient at bedside. He is awake, alert, oriented to self, but otherwise disoriented with pleasant confusion. He does not appear to be in pain or distress during visit. No family at bedside.   Per chart review, patient has a DSS legal guardian [facility]). DSS documentation uploaded in EMR. Call placed to [facility]. No answer. Secure VM left requesting return call for goals of care discussion.   PMT will follow. Thank you.   NO CHARGE  Vennie Homans, DNP, FNP-C Palliative Medicine Team  Phone: [phone] Fax: [phone]

## 2020-05-13 NOTE — Progress Notes (Signed)
Spoke to [employer] [facility] group home [employer] designer, [employer], [employer]. Per Production designer, theatre/television/film, pt has DSS legal guardian, [place]. Legal guardian's number [phone].

## 2020-05-13 NOTE — Progress Notes (Signed)
PROGRESS NOTE    Cameron Ford  ZOX:[id number] DOB: 04/30/1946 DOA: 05/03/2020 PCP: Kaleen Mask, MD   Chief complaint: shortness of breath Brief Narrative:   Patient from a group home and was admitted with confusion.  Initially diagnosed with UTI and was on Rocephin.  E. coli grew out of the urine culture.  The patient also diagnosed with acute pulmonary embolism with right heart strain and is now on Eliquis.  Also has acute on chronic hypoxic respiratory failure on 2 L of oxygen.  Also diagnosed with COPD exacerbation possible pneumonia and will finish up prednisone and Augmentin.  I also did give low-dose bisoprolol and Lasix for diastolic congestive heart failure. Physical therapy recommends rehab.  Unfortunately no rehab beds available. The patient's guardian will also look into other options.     Assessment & Plan:   Principal Problem:   Acute pulmonary embolism (HCC) Active Problems:   Constipation   Essential hypertension   Acute metabolic encephalopathy   Acute cystitis   AKI (acute kidney injury) (HCC)   Acute respiratory failure with hypoxia (HCC)   Acute diastolic CHF (congestive heart failure) (HCC)   E. coli UTI   Weakness   COPD with acute exacerbation (HCC)   Hemorrhoids   Dementia without behavioral disturbance (HCC)  #1. Acute hypoxemic respiratory failure secondary to acute pulmonary embolisms and acute on chronic diastolic congestive heart failure. Acute pulm emboli with right heart strain. Acute diastolic congestive heart failure. COPD exacerbation. Currently, patient condition seem to be improving.  However, is currently on 3 L oxygen.  BNP has normalized, no longer has any volume overload. Patient was also evaluated by speech therapist, no evidence of aspiration. Continue steroid taper, antibiotics is completed. I will maximize COPD treatment. Patient may need home oxygen.  #2.  Bipolar disorder and dementia. Continue home medicines per  3.   E. coli urinary tract infection. Antibiotics completed.         DVT prophylaxis: Eliquis Code Status: Full Family Communication: None Disposition Plan:  .   Status is: Inpatient  Remains inpatient appropriate because:Unsafe d/c plan and Inpatient level of care appropriate due to severity of illness   Dispo:  Patient From: Group Home  Planned Disposition: Skilled Nursing Facility  Expected discharge date: 05/16/2020  Medically stable for discharge: Yes         I/O last 3 completed shifts: In: 240 [P.O.:240] Out: 1525 [Urine:1525] Total I/O In: 480 [P.O.:480] Out: -      Consultants:   None  Procedures: None  Antimicrobials: None  Subjective: Patient is still on 3 L oxygen, no significant short of breath.  He has good appetite, no nausea vomiting abdominal pain.  He does not have any dysphagia or choking. No fever chills No dysuria hematuria  Objective: Vitals:   05/13/20 0136 05/13/20 0412 05/13/20 0749 05/13/20 1134  BP:  (!) 95/39 (!) 97/46 120/90  Pulse:  (!) 56 66 60  Resp:  16 16 18   Temp:  98.4 F (36.9 C) 98.8 F (37.1 C) 98.1 F (36.7 C)  TempSrc:   Oral Oral  SpO2:  99% 93% 95%  Weight: 75.9 kg     Height:        Intake/Output Summary (Last 24 hours) at 05/13/2020 1401 Last data filed at 05/13/2020 1021 Gross per 24 hour  Intake 720 ml  Output 1025 ml  Net -305 ml   Filed Weights   05/10/20 0512 05/12/20 0427 05/13/20 0136  Weight: 73.9  kg 75.1 kg 75.9 kg    Examination:  General exam: Appears calm and comfortable  Respiratory system: Diffuse rhonchi. Respiratory effort normal. Cardiovascular system: S1 & S2 heard, RRR. No JVD, murmurs, rubs, gallops or clicks. No pedal edema. Gastrointestinal system: Abdomen is nondistended, soft and nontender. No organomegaly or masses felt. Normal bowel sounds heard. Central nervous system: Alert and oriented x2. No focal neurological deficits. Extremities: Symmetric  Skin: No rashes,  lesions or ulcers Psychiatry:  Mood & affect appropriate.     Data Reviewed: I have personally reviewed following labs and imaging studies  CBC: Recent Labs  Lab 05/07/20 0355 05/09/20 0532 05/12/20 0533  WBC 6.7 7.6 6.5  HGB 11.4* 11.1* 10.7*  HCT 34.9* 33.8* 33.0*  MCV 102.3* 99.4 101.2*  PLT 272 315 363   Basic Metabolic Panel: Recent Labs  Lab 05/07/20 0355 05/09/20 0532 05/12/20 0533  NA 137 134* 133*  K 4.1 4.5 4.5  CL 91* 89* 91*  CO2 36* 35* 33*  GLUCOSE 106* 118* 99  BUN 19 30* 25*  CREATININE 0.80 0.75 0.78  CALCIUM 9.2 9.6 9.5   GFR: Estimated Creatinine Clearance: 81.1 mL/min (by C-G formula based on SCr of 0.78 mg/dL). Liver Function Tests: No results for input(s): AST, ALT, ALKPHOS, BILITOT, PROT, ALBUMIN in the last 168 hours. No results for input(s): LIPASE, AMYLASE in the last 168 hours. No results for input(s): AMMONIA in the last 168 hours. Coagulation Profile: No results for input(s): INR, PROTIME in the last 168 hours. Cardiac Enzymes: No results for input(s): CKTOTAL, CKMB, CKMBINDEX, TROPONINI in the last 168 hours. BNP (last 3 results) No results for input(s): PROBNP in the last 8760 hours. HbA1C: No results for input(s): HGBA1C in the last 72 hours. CBG: No results for input(s): GLUCAP in the last 168 hours. Lipid Profile: No results for input(s): CHOL, HDL, LDLCALC, TRIG, CHOLHDL, LDLDIRECT in the last 72 hours. Thyroid Function Tests: No results for input(s): TSH, T4TOTAL, FREET4, T3FREE, THYROIDAB in the last 72 hours. Anemia Panel: No results for input(s): VITAMINB12, FOLATE, FERRITIN, TIBC, IRON, RETICCTPCT in the last 72 hours. Sepsis Labs: No results for input(s): PROCALCITON, LATICACIDVEN in the last 168 hours.  Recent Results (from the past 240 hour(s))  Urine Culture     Status: Abnormal   Collection Time: 05/03/20  2:16 PM   Specimen: Urine, Random  Result Value Ref Range Status   Specimen Description   Final     URINE, RANDOM Performed at [facility], [address] [place], [place] [address]    Special Requests   Final    NONE Performed at [facility], [address] [place], [place] [address]    Culture >=100,000 COLONIES/mL ESCHERICHIA COLI (A)  Final   Report Status 05/05/2020 FINAL  Final   Organism ID, Bacteria ESCHERICHIA COLI (A)  Final      Susceptibility   Escherichia coli - MIC*    AMPICILLIN 16 INTERMEDIATE Intermediate     CEFAZOLIN <=4 SENSITIVE Sensitive     CEFEPIME <=0.12 SENSITIVE Sensitive     CEFTRIAXONE <=0.25 SENSITIVE Sensitive     CIPROFLOXACIN >=4 RESISTANT Resistant     GENTAMICIN <=1 SENSITIVE Sensitive     IMIPENEM <=0.25 SENSITIVE Sensitive     NITROFURANTOIN 32 SENSITIVE Sensitive     TRIMETH/SULFA <=20 SENSITIVE Sensitive     AMPICILLIN/SULBACTAM 8 SENSITIVE Sensitive     PIP/TAZO <=4 SENSITIVE Sensitive     * >=100,000 COLONIES/mL ESCHERICHIA COLI  Resp  Panel by RT-PCR (Flu A&B, Covid) Nasopharyngeal Swab     Status: None   Collection Time: 05/03/20  5:29 PM   Specimen: Nasopharyngeal Swab; Nasopharyngeal(NP) swabs in vial transport medium  Result Value Ref Range Status   SARS Coronavirus 2 by RT PCR NEGATIVE NEGATIVE Final    Comment: (NOTE) SARS-CoV-2 target nucleic acids are NOT DETECTED.  The SARS-CoV-2 RNA is generally detectable in upper respiratory specimens during the acute phase of infection. The lowest concentration of SARS-CoV-2 viral copies this assay can detect is 138 copies/mL. A negative result does not preclude SARS-Cov-2 infection and should not be used as the sole basis for treatment or other patient management decisions. A negative result may occur with  improper specimen collection/handling, submission of specimen other than nasopharyngeal swab, presence of viral mutation(s) within the areas targeted by this assay, and inadequate number of viral copies(<138 copies/mL). A negative result must be combined  with clinical observations, patient history, and epidemiological information. The expected result is Negative.  Fact Sheet for Patients:  BloggerCourse.com  Fact Sheet for Healthcare Providers:  SeriousBroker.it  This test is no t yet approved or cleared by the [place] FDA and  has been authorized for detection and/or diagnosis of SARS-CoV-2 by FDA under an Emergency Use Authorization (EUA). This EUA will remain  in effect (meaning this test can be used) for the duration of the COVID-19 declaration under Section 564(b)(1) of the Act, 21 U.S.C.section 360bbb-3(b)(1), unless the authorization is terminated  or revoked sooner.       Influenza A by PCR NEGATIVE NEGATIVE Final   Influenza B by PCR NEGATIVE NEGATIVE Final    Comment: (NOTE) The Xpert Xpress SARS-CoV-2/FLU/RSV plus assay is intended as an aid in the diagnosis of influenza from Nasopharyngeal swab specimens and should not be used as a sole basis for treatment. Nasal washings and aspirates are unacceptable for Xpert Xpress SARS-CoV-2/FLU/RSV testing.  Fact Sheet for Patients: BloggerCourse.com  Fact Sheet for Healthcare Providers: SeriousBroker.it  This test is not yet approved or cleared by the [place] FDA and has been authorized for detection and/or diagnosis of SARS-CoV-2 by FDA under an Emergency Use Authorization (EUA). This EUA will remain in effect (meaning this test can be used) for the duration of the COVID-19 declaration under Section 564(b)(1) of the Act, 21 U.S.C. section 360bbb-3(b)(1), unless the authorization is terminated or revoked.  Performed at [facility], [address] [place], [place] [address]   Blood culture (routine x 2)     Status: None   Collection Time: 05/03/20  6:10 PM   Specimen: BLOOD  Result Value Ref Range Status   Specimen Description BLOOD LEFT  ANTECUBITAL  Final   Special Requests   Final    BOTTLES DRAWN AEROBIC AND ANAEROBIC Blood Culture adequate volume   Culture   Final    NO GROWTH 5 DAYS Performed at [facility], [address] [place], [place] [address]    Report Status 05/08/2020 FINAL  Final  Blood culture (routine x 2)     Status: None   Collection Time: 05/03/20  6:23 PM   Specimen: BLOOD  Result Value Ref Range Status   Specimen Description BLOOD BLOOD LEFT HAND  Final   Special Requests   Final    BOTTLES DRAWN AEROBIC AND ANAEROBIC Blood Culture results may not be optimal due to an excessive volume of blood received in culture bottles   Culture   Final    NO GROWTH  5 DAYS Performed at [facility], [address], [place], [place] [address]    Report Status 05/08/2020 FINAL  Final         Radiology Studies: No results found.      Scheduled Meds: . apixaban  5 mg Oral BID  . bisoprolol  2.5 mg Oral Daily  . carbamazepine  200 mg Oral BID  . furosemide  10 mg Oral Daily  . hydrocortisone   Topical QID  . lithium carbonate  150 mg Oral BID WC  . loratadine  10 mg Oral Daily  . lubiprostone  8 mcg Oral BID WC  . mometasone-formoterol  2 puff Inhalation BID  . risperiDONE  1 mg Oral Q8H  . tiotropium  18 mcg Inhalation Daily   Continuous Infusions:   LOS: 10 days    Time spent: 28 minutes    Marrion Coy, MD [facility]   To contact the attending provider between 7A-7P or the covering provider during after hours 7P-7A, please log into the web site [email or web address] and access using universal [place] password for that web site. If you do not have the password, please call the hospital operator.  05/13/2020, 2:01 PM

## 2020-05-14 DIAGNOSIS — G9341 Metabolic encephalopathy: Secondary | ICD-10-CM | POA: Diagnosis not present

## 2020-05-14 DIAGNOSIS — I2699 Other pulmonary embolism without acute cor pulmonale: Secondary | ICD-10-CM | POA: Diagnosis not present

## 2020-05-14 DIAGNOSIS — J9601 Acute respiratory failure with hypoxia: Secondary | ICD-10-CM | POA: Diagnosis not present

## 2020-05-14 DIAGNOSIS — I5031 Acute diastolic (congestive) heart failure: Secondary | ICD-10-CM | POA: Diagnosis not present

## 2020-05-14 NOTE — Progress Notes (Signed)
Physical Therapy Treatment Patient Details Name: [name] MRN: [id number] DOB: 1946-02-06 Today's Date: 05/14/2020    History of Present Illness Cameron Ford is a 75 y.o. male with medical history significant for dementia, type I bipolar disorder, benign essential tremors, hypertension, moderate persistent asthma who is admitted to [facility] on 05/03/2020 with acute hypoxic respiratory distress in the setting of acute pulmonary embolism after presenting from group home to [facility] Emergency Department for evaluation of confusion    PT Comments    Pt was long sitting in bed upon arriving. He in on 3 L O2 [place] and agrees to OOB. Eager for OOB activity. Required min assist to exit L side of bed via log roll technique. Min-mod to achieve full up right EOB short sit. Stood with +2 assist for safety however was able to stand with min assist of one. Ambulated 5 ft with RW however after 5 ft pt endorse fatigue requesting to sit. Was able to ambulate back to EOB  and returned to supine. Overall tolerated well. Will need extensive PT going forward to maximize abilities and improve independence with ADLs.   Follow Up Recommendations  SNF     Equipment Recommendations  Rolling walker with 5" wheels;3in1 (PT)    Recommendations for Other Services       Precautions / Restrictions Precautions Precautions: Fall Restrictions Weight Bearing Restrictions: Yes    Mobility  Bed Mobility Overal bed mobility: Needs Assistance Bed Mobility: Supine to Sit;Sit to Supine;Rolling Rolling: Min assist   Supine to sit: Min assist;Mod assist;HOB elevated Sit to supine: Min assist   General bed mobility comments: pt was able to exit L side of bed with min/mod assist. min assist to bring BLEs into bed after OOB activity  Transfers Overall transfer level: Needs assistance Equipment used: Rolling walker (2 wheeled) Transfers: Sit to/from Stand Sit to Stand: +2 safety/equipment;Min  assist;From elevated surface         General transfer comment: Second person for safety but pt able to stand from elevated bed height with min assist  Ambulation/Gait Ambulation/Gait assistance: Min assist Gait Distance (Feet): 5 Feet Assistive device: Rolling walker (2 wheeled) Gait Pattern/deviations: Step-to pattern;Trunk flexed;Shuffle Gait velocity: decreased   General Gait Details: pt ambulated ~ 5 ft away friom bed prior to gait becoming unsteady and pt endorsing fatigue. Pt was able to take ~ 5 steps bacwards to return to EOB. reapplied O2 for safety. Pt has poor read on pulse ox btu did endorse lightheadedness after returning to bed. RN in room.       Balance Overall balance assessment: Needs assistance Sitting-balance support: Feet supported;Single extremity supported Sitting balance-Leahy Scale: Poor     Standing balance support: Bilateral upper extremity supported;During functional activity Standing balance-Leahy Scale: Poor Standing balance comment: needs constant assistance for safety in standing         Cognition Arousal/Alertness: Awake/alert Behavior During Therapy: WFL for tasks assessed/performed Overall Cognitive Status: No family/caregiver present to determine baseline cognitive functioning      General Comments: Pt is oriented to self. Able to follow commands with increased time to process. On 3 L o2 upon arriving however pt reports no O2 at baseline             Pertinent Vitals/Pain Pain Assessment: 0-10 Pain Score: 4  Faces Pain Scale: Hurts little more Pain Location: Low back pain Pain Descriptors / Indicators: Aching;Sore Pain Intervention(s): Limited activity within patient's tolerance;Monitored during session;Repositioned  PT Goals (current goals can now be found in the care plan section) Acute Rehab PT Goals Patient Stated Goal: go home to girlfriend Progress towards PT goals: Progressing toward goals    Frequency     Min 2X/week      PT Plan Current plan remains appropriate       AM-PAC PT "6 Clicks" Mobility   Outcome Measure  Help needed turning from your back to your side while in a flat bed without using bedrails?: A Lot Help needed moving from lying on your back to sitting on the side of a flat bed without using bedrails?: A Lot Help needed moving to and from a bed to a chair (including a wheelchair)?: A Lot Help needed standing up from a chair using your arms (e.g., wheelchair or bedside chair)?: A Lot Help needed to walk in hospital room?: A Lot Help needed climbing 3-5 steps with a railing? : A Lot 6 Click Score: 12    End of Session Equipment Utilized During Treatment: Gait belt Activity Tolerance: Patient tolerated treatment well;Patient limited by fatigue Patient left: in bed;with call bell/phone within reach;with bed alarm set Nurse Communication: Mobility status PT Visit Diagnosis: Muscle weakness (generalized) (M62.81);Difficulty in walking, not elsewhere classified (R26.2)     Time: 3419-3790 PT Time Calculation (min) (ACUTE ONLY): 10 min  Charges:  $Therapeutic Activity: 8-22 mins                     Jetta Lout PTA 05/14/20, 3:19 PM

## 2020-05-14 NOTE — TOC Progression Note (Signed)
Transition of Care [facility]) - Progression Note    Patient Details  Name: Cameron Ford MRN: [id number] Date of Birth: 03-12-1946  Transition of [facility]) CM/SW Contact  Trenton Founds, RN Phone Number: 05/14/2020, 3:09 PM  Clinical Narrative:   RNCM reached out to patient's guardian [facility] regarding bed offers, voice mail left for return call.          Expected Discharge Plan and Services                                                 Social Determinants of Health (SDOH) Interventions    Readmission Risk Interventions No flowsheet data found.

## 2020-05-14 NOTE — TOC Progression Note (Signed)
Transition of Care [facility]) - Progression Note    Patient Details  Name: Cameron Ford MRN: [id number] Date of Birth: November 20, 1945  Transition of Care [facility]) CM/SW Contact  Trenton Founds, RN Phone Number: 05/14/2020, 8:01 AM  Clinical Narrative:   RNCM extended bed search out further due to lack of available bed offers.          Expected Discharge Plan and Services                                                 Social Determinants of Health (SDOH) Interventions    Readmission Risk Interventions No flowsheet data found.

## 2020-05-14 NOTE — Progress Notes (Signed)
PROGRESS NOTE    Cameron Ford  HYW:[id number] DOB: 12/01/1945 DOA: 05/03/2020 PCP: Kaleen Mask, MD   Chief complaint.  Shortness of breath. Brief Narrative:  Patient from a group home and was admitted with confusion. Initially diagnosed with UTI and was on Rocephin. E. coli grew out of the urine culture. The patient also diagnosed with acute pulmonary embolism with right heart strain and is now on Eliquis. Also has acute on chronic hypoxic respiratory failure on 2 L of oxygen. Also diagnosed with COPD exacerbation possible pneumonia and will finish up prednisone and Augmentin. I also did give low-dose bisoprolol and Lasix for diastolic congestive heart failure. Physical therapy recommends rehab.  Pending SNF placement   Assessment & Plan:   Principal Problem:   Acute pulmonary embolism (HCC) Active Problems:   Constipation   Essential hypertension   Acute metabolic encephalopathy   Acute cystitis   AKI (acute kidney injury) (HCC)   Acute respiratory failure with hypoxia (HCC)   Acute diastolic CHF (congestive heart failure) (HCC)   E. coli UTI   Weakness   COPD with acute exacerbation (HCC)   Hemorrhoids   Dementia without behavioral disturbance (HCC)  #1. Acute hypoxemic respiratory failure secondary to acute pulmonary embolisms and acute on chronic diastolic congestive heart failure. Acute pulm emboli with right heart strain. Acute diastolic congestive heart failure. COPD exacerbation. Patient is more stable today.  Still on oxygen. No change in the treatment today.  Continue taper down steroids.  Continue anticoagulation.  2.  Bipolar disorder and dementia. Continue home medicines per  3.  E. coli urinary tract infection. Antibiotics completed.    DVT prophylaxis: Eliquis Code Status: Full Family Communication:  Disposition Plan:  .   Status is: Inpatient  Remains inpatient appropriate because:Unsafe d/c plan   Dispo:  Patient From: Group  Home  Planned Disposition: Skilled Nursing Facility  Expected discharge date: 05/16/2020  Medically stable for discharge: Yes         I/O last 3 completed shifts: In: 960 [P.O.:960] Out: 2675 [Urine:2675] Total I/O In: -  Out: 750 [Urine:750]     Consultants:   None  Procedures: None  Antimicrobials:None  Subjective: Patient is still requiring oxygen, does not feel short of breath.  Cough, nonproductive. Good appetite, no dysphagia or choking. No fever or chills per No abdominal pain or nausea vomiting.  Objective: Vitals:   05/14/20 0440 05/14/20 0500 05/14/20 0742 05/14/20 1202  BP: (!) 99/49  (!) 84/36 (!) 108/46  Pulse: (!) 56  (!) 59 66  Resp:   16 17  Temp: 97.9 F (36.6 C)  98.3 F (36.8 C) 98.4 F (36.9 C)  TempSrc:      SpO2: 97%  97% 95%  Weight:  75.4 kg    Height:        Intake/Output Summary (Last 24 hours) at 05/14/2020 1313 Last data filed at 05/14/2020 1201 Gross per 24 hour  Intake 480 ml  Output 2400 ml  Net -1920 ml   Filed Weights   05/12/20 0427 05/13/20 0136 05/14/20 0500  Weight: 75.1 kg 75.9 kg 75.4 kg    Examination:  General exam: Appears calm and comfortable  Respiratory system: A few crackles in the base. respiratory effort normal. Cardiovascular system: S1 & S2 heard, RRR. No JVD, murmurs, rubs, gallops or clicks. No pedal edema. Gastrointestinal system: Abdomen is nondistended, soft and nontender. No organomegaly or masses felt. Normal bowel sounds heard. Central nervous system: Alert and oriented x2.  No focal neurological deficits. Extremities: Symmetric 5 x 5 power. Skin: No rashes, lesions or ulcers Psychiatry: Judgement and insight appear normal. Mood & affect appropriate.     Data Reviewed: I have personally reviewed following labs and imaging studies  CBC: Recent Labs  Lab 05/09/20 0532 05/12/20 0533  WBC 7.6 6.5  HGB 11.1* 10.7*  HCT 33.8* 33.0*  MCV 99.4 101.2*  PLT 315 363   Basic Metabolic  Panel: Recent Labs  Lab 05/09/20 0532 05/12/20 0533  NA 134* 133*  K 4.5 4.5  CL 89* 91*  CO2 35* 33*  GLUCOSE 118* 99  BUN 30* 25*  CREATININE 0.75 0.78  CALCIUM 9.6 9.5   GFR: Estimated Creatinine Clearance: 81.1 mL/min (by C-G formula based on SCr of 0.78 mg/dL). Liver Function Tests: No results for input(s): AST, ALT, ALKPHOS, BILITOT, PROT, ALBUMIN in the last 168 hours. No results for input(s): LIPASE, AMYLASE in the last 168 hours. No results for input(s): AMMONIA in the last 168 hours. Coagulation Profile: No results for input(s): INR, PROTIME in the last 168 hours. Cardiac Enzymes: No results for input(s): CKTOTAL, CKMB, CKMBINDEX, TROPONINI in the last 168 hours. BNP (last 3 results) No results for input(s): PROBNP in the last 8760 hours. HbA1C: No results for input(s): HGBA1C in the last 72 hours. CBG: No results for input(s): GLUCAP in the last 168 hours. Lipid Profile: No results for input(s): CHOL, HDL, LDLCALC, TRIG, CHOLHDL, LDLDIRECT in the last 72 hours. Thyroid Function Tests: No results for input(s): TSH, T4TOTAL, FREET4, T3FREE, THYROIDAB in the last 72 hours. Anemia Panel: No results for input(s): VITAMINB12, FOLATE, FERRITIN, TIBC, IRON, RETICCTPCT in the last 72 hours. Sepsis Labs: No results for input(s): PROCALCITON, LATICACIDVEN in the last 168 hours.  No results found for this or any previous visit (from the past 240 hour(s)).       Radiology Studies: No results found.      Scheduled Meds: . apixaban  5 mg Oral BID  . bisoprolol  2.5 mg Oral Daily  . carbamazepine  200 mg Oral BID  . furosemide  10 mg Oral Daily  . hydrocortisone   Topical QID  . lithium carbonate  150 mg Oral BID WC  . loratadine  10 mg Oral Daily  . lubiprostone  8 mcg Oral BID WC  . mometasone-formoterol  2 puff Inhalation BID  . risperiDONE  1 mg Oral Q8H  . tiotropium  18 mcg Inhalation Daily   Continuous Infusions:   LOS: 11 days    Time spent: 27  minutes    Marrion Coy, MD [facility]   To contact the attending provider between 7A-7P or the covering provider during after hours 7P-7A, please log into the web site [email or web address] and access using universal [place] password for that web site. If you do not have the password, please call the hospital operator.  05/14/2020, 1:13 PM

## 2020-05-15 ENCOUNTER — Inpatient Hospital Stay: Payer: Medicare Other

## 2020-05-15 DIAGNOSIS — Z515 Encounter for palliative care: Secondary | ICD-10-CM

## 2020-05-15 DIAGNOSIS — Z7189 Other specified counseling: Secondary | ICD-10-CM

## 2020-05-15 DIAGNOSIS — G9341 Metabolic encephalopathy: Secondary | ICD-10-CM | POA: Diagnosis not present

## 2020-05-15 DIAGNOSIS — J9601 Acute respiratory failure with hypoxia: Secondary | ICD-10-CM | POA: Diagnosis not present

## 2020-05-15 DIAGNOSIS — I2699 Other pulmonary embolism without acute cor pulmonale: Secondary | ICD-10-CM | POA: Diagnosis not present

## 2020-05-15 DIAGNOSIS — I5031 Acute diastolic (congestive) heart failure: Secondary | ICD-10-CM | POA: Diagnosis not present

## 2020-05-15 NOTE — Care Management Important Message (Signed)
Important Message  Patient Details  Name: Cameron Ford MRN: [id number] Date of Birth: 1945-07-17   Medicare Important Message Given:  Other (see comment)  Left a message for Ms. Jewel Monroe with [employer] [phone]) as she is this patient's legal guardian.  Asked that she call me so I could review the Important Message from Medicare.  [name] Aubryanna Nesheim 05/15/2020, 11:38 AM

## 2020-05-15 NOTE — Progress Notes (Signed)
PROGRESS NOTE    Cameron Ford  DZH:[id number] DOB: February 06, 1946 DOA: 05/03/2020 PCP: Kaleen Mask, MD   Chief complaint: shortness of breath. Brief Narrative:  Patient from a group home and was admitted with confusion. Initially diagnosed with UTI and was on Rocephin. E. coli grew out of the urine culture. The patient also diagnosed with acute pulmonary embolism with right heart strain and is now on Eliquis. Also has acute on chronic hypoxic respiratory failure on 2 L of oxygen. Also diagnosed with COPD exacerbation possible pneumonia and will finish up prednisone and Augmentin. I also did give low-dose bisoprolol and Lasix for diastolic congestive heart failure. Physical therapy recommends rehab.  Pending SNF placement   Assessment & Plan:   Principal Problem:   Acute pulmonary embolism (HCC) Active Problems:   Constipation   Essential hypertension   Acute metabolic encephalopathy   Acute cystitis   AKI (acute kidney injury) (HCC)   Acute respiratory failure with hypoxia (HCC)   Acute diastolic CHF (congestive heart failure) (HCC)   E. coli UTI   Weakness   COPD with acute exacerbation (HCC)   Hemorrhoids   Dementia without behavioral disturbance (HCC)  #1. Acute hypoxemic respiratory failure secondary to acute pulmonary embolisms and acute on chronic diastolic congestive heart failure. Acute pulm emboli with right heart strain. Acute diastolic congestive heart failure. COPD exacerbation. Patient currently on 4 L oxygen, which is slightly worse than yesterday.  Per patient, he does not feel worse today. BNP normal, no additional volume overload. No bronchospasm. I will recheck a chest x-ray to evaluate.  #2.  Bipolar disorder and dementia. Continue home medicines.  3.  E. coli urinary tract infection. Resolved.  #4.  Mild hyponatremia.   Recheck a BMP tomorrow   DVT prophylaxis: Eliquis Code Status: Full Family Communication:  Disposition Plan:   .   Status is: Inpatient  Remains inpatient appropriate because:Unsafe d/c plan   Dispo:  Patient From: Group Home  Planned Disposition: Skilled Nursing Facility  Expected discharge date: 05/16/2020  Medically stable for discharge: Yes         I/O last 3 completed shifts: In: 0  Out: 1800 [Urine:1800] Total I/O In: 240 [P.O.:240] Out: -      Consultants:   None  Procedures: None  Antimicrobials: None  Subjective: Patient feels well today.  On 4 L oxygen, which reflect a slight worsening of oxygenation.  Patient does not feel more short of breath today.  He still has been short of breath with exertion.  He has no cough.  He does not have any choking or dysphagia. Denies any abdominal pain or nausea vomiting. No fever or chills. No dysuria or hematuria.  Objective: Vitals:   05/14/20 2051 05/15/20 0117 05/15/20 0538 05/15/20 0833  BP: 95/60 102/62 109/60 (!) 125/94  Pulse: 62 65 62 60  Resp: 16 16 15 18   Temp: (!) 97.5 F (36.4 C) (!) 97.4 F (36.3 C) 98 F (36.7 C) 97.9 F (36.6 C)  TempSrc:  Oral  Oral  SpO2: 96% 98% 94% 96%  Weight:  75.9 kg    Height:        Intake/Output Summary (Last 24 hours) at 05/15/2020 1101 Last data filed at 05/15/2020 0900 Gross per 24 hour  Intake 240 ml  Output 1150 ml  Net -910 ml   Filed Weights   05/13/20 0136 05/14/20 0500 05/15/20 0117  Weight: 75.9 kg 75.4 kg 75.9 kg    Examination:  General exam:  Appears calm and comfortable  Respiratory system: A few crackles in the bases.Marland Kitchen Respiratory effort normal. Cardiovascular system: S1 & S2 heard, RRR. No JVD, murmurs, rubs, gallops or clicks. No pedal edema. Gastrointestinal system: Abdomen is nondistended, soft and nontender. No organomegaly or masses felt. Normal bowel sounds heard. Central nervous system: Alert and oriented x2. No focal neurological deficits. Extremities: Symmetric 5 x 5 power. Skin: No rashes, lesions or ulcers Psychiatry:Mood & affect  appropriate.     Data Reviewed: I have personally reviewed following labs and imaging studies  CBC: Recent Labs  Lab 05/09/20 0532 05/12/20 0533  WBC 7.6 6.5  HGB 11.1* 10.7*  HCT 33.8* 33.0*  MCV 99.4 101.2*  PLT 315 363   Basic Metabolic Panel: Recent Labs  Lab 05/09/20 0532 05/12/20 0533  NA 134* 133*  K 4.5 4.5  CL 89* 91*  CO2 35* 33*  GLUCOSE 118* 99  BUN 30* 25*  CREATININE 0.75 0.78  CALCIUM 9.6 9.5   GFR: Estimated Creatinine Clearance: 81.1 mL/min (by C-G formula based on SCr of 0.78 mg/dL). Liver Function Tests: No results for input(s): AST, ALT, ALKPHOS, BILITOT, PROT, ALBUMIN in the last 168 hours. No results for input(s): LIPASE, AMYLASE in the last 168 hours. No results for input(s): AMMONIA in the last 168 hours. Coagulation Profile: No results for input(s): INR, PROTIME in the last 168 hours. Cardiac Enzymes: No results for input(s): CKTOTAL, CKMB, CKMBINDEX, TROPONINI in the last 168 hours. BNP (last 3 results) No results for input(s): PROBNP in the last 8760 hours. HbA1C: No results for input(s): HGBA1C in the last 72 hours. CBG: No results for input(s): GLUCAP in the last 168 hours. Lipid Profile: No results for input(s): CHOL, HDL, LDLCALC, TRIG, CHOLHDL, LDLDIRECT in the last 72 hours. Thyroid Function Tests: No results for input(s): TSH, T4TOTAL, FREET4, T3FREE, THYROIDAB in the last 72 hours. Anemia Panel: No results for input(s): VITAMINB12, FOLATE, FERRITIN, TIBC, IRON, RETICCTPCT in the last 72 hours. Sepsis Labs: No results for input(s): PROCALCITON, LATICACIDVEN in the last 168 hours.  No results found for this or any previous visit (from the past 240 hour(s)).       Radiology Studies: No results found.      Scheduled Meds: . apixaban  5 mg Oral BID  . bisoprolol  2.5 mg Oral Daily  . carbamazepine  200 mg Oral BID  . furosemide  10 mg Oral Daily  . hydrocortisone   Topical QID  . lithium carbonate  150 mg Oral  BID WC  . loratadine  10 mg Oral Daily  . lubiprostone  8 mcg Oral BID WC  . mometasone-formoterol  2 puff Inhalation BID  . risperiDONE  1 mg Oral Q8H  . tiotropium  18 mcg Inhalation Daily   Continuous Infusions:   LOS: 12 days    Time spent: 28 minutes    Marrion Coy, MD [facility]   To contact the attending provider between 7A-7P or the covering provider during after hours 7P-7A, please log into the web site [email or web address] and access using universal [place] password for that web site. If you do not have the password, please call the hospital operator.  05/15/2020, 11:01 AM

## 2020-05-15 NOTE — Progress Notes (Signed)
Physical Therapy Treatment Patient Details Name: Cameron Ford MRN: [id number] DOB: 13-Mar-1946 Today's Date: 05/15/2020    History of Present Illness Cameron Ford is a 75 y.o. male with medical history significant for dementia, type I bipolar disorder, benign essential tremors, hypertension, moderate persistent asthma who is admitted to [facility] on 05/03/2020 with acute hypoxic respiratory distress in the setting of acute pulmonary embolism after presenting from group home to [facility] for evaluation of confusion    PT Comments    Pt resting in bed upon PT arrival; agreeable to PT session.  Tolerated LE ex's in bed fairly well.  Mod to max assist to stand from bed up to RW x2 trials (2nd assist for safety).  Pt incontinent of bowel requiring assist for clean-up after 1st stand.  Then pt able to take steps bed to recliner with CGA to min assist x2 and RW use.  Intermittent assist required for standing balance d/t posterior lean.  Will continue to focus on strengthening and progressive functional mobility per pt tolerance.      Follow Up Recommendations  SNF     Equipment Recommendations  Rolling walker with 5" wheels;3in1 (PT)    Recommendations for Other Services       Precautions / Restrictions Precautions Precautions: Fall Restrictions Weight Bearing Restrictions: No    Mobility  Bed Mobility Overal bed mobility: Needs Assistance Bed Mobility: Supine to Sit     Supine to sit: Mod assist;Max assist;HOB elevated     General bed mobility comments: assist for trunk semi-supine to sitting edge of bed  Transfers Overall transfer level: Needs assistance Equipment used: Rolling walker (2 wheeled) Transfers: Sit to/from Stand Sit to Stand: Mod assist;Max assist;+2 safety/equipment         General transfer comment: mod to max assist to stand up from bed x2 trials (2nd assist for safety); vc's for  technique  Ambulation/Gait Ambulation/Gait assistance: Min guard;Min assist;+2 physical assistance Gait Distance (Feet): 3 Feet (bed to recliner) Assistive device: Rolling walker (2 wheeled) Gait Pattern/deviations: Step-to pattern;Trunk flexed;Shuffle Gait velocity: decreased   General Gait Details: assist to steady; increased time to take steps   Stairs             Wheelchair Mobility    Modified Rankin (Stroke Patients Only)       Balance Overall balance assessment: Needs assistance Sitting-balance support: Feet supported;Single extremity supported Sitting balance-Leahy Scale: Fair Sitting balance - Comments: kyphotic posture; steady static sitting; SBA to CGA for safety   Standing balance support: Bilateral upper extremity supported;During functional activity Standing balance-Leahy Scale: Poor Standing balance comment: intermittent assist for upright posture d/t posterior lean in standing;                            Cognition Arousal/Alertness: Awake/alert Behavior During Therapy: WFL for tasks assessed/performed Overall Cognitive Status: No family/caregiver present to determine baseline cognitive functioning                                 General Comments: Oriented to self.  Increased time to follow commands.      Exercises General Exercises - Lower Extremity Ankle Circles/Pumps: AROM;Strengthening;Both;10 reps;Supine Heel Slides: Strengthening;Both;10 reps;Supine;AAROM Hip ABduction/ADduction: Strengthening;Both;10 reps;Supine;AAROM    General Comments   Nursing cleared pt for participation in physical therapy.  Pt agreeable to PT session.  Pertinent Vitals/Pain Pain Assessment: No/denies pain Pain Intervention(s): Limited activity within patient's tolerance;Monitored during session  Vitals (HR and O2 on 4 L via nasal cannula) stable and WFL throughout treatment session.    Home Living                       Prior Function            PT Goals (current goals can now be found in the care plan section) Acute Rehab PT Goals Patient Stated Goal: to go home PT Goal Formulation: With patient Time For Goal Achievement: 05/20/20 Potential to Achieve Goals: Fair Progress towards PT goals: Progressing toward goals    Frequency    Min 2X/week      PT Plan Current plan remains appropriate    Co-evaluation              AM-PAC PT "6 Clicks" Mobility   Outcome Measure  Help needed turning from your back to your side while in a flat bed without using bedrails?: A Lot Help needed moving from lying on your back to sitting on the side of a flat bed without using bedrails?: A Lot Help needed moving to and from a bed to a chair (including a wheelchair)?: A Lot Help needed standing up from a chair using your arms (e.g., wheelchair or bedside chair)?: A Lot Help needed to walk in hospital room?: A Lot Help needed climbing 3-5 steps with a railing? : A Lot 6 Click Score: 12    End of Session Equipment Utilized During Treatment: Gait belt Activity Tolerance: Patient tolerated treatment well Patient left: in chair;with call bell/phone within reach;with chair alarm set;Other (comment) (B heels floating via pillow support) Nurse Communication: Mobility status;Precautions (NT notified of pt's assist levels) PT Visit Diagnosis: Muscle weakness (generalized) (M62.81);Difficulty in walking, not elsewhere classified (R26.2)     Time: 6789-3810 PT Time Calculation (min) (ACUTE ONLY): 30 min  Charges:  $Therapeutic Exercise: 8-22 mins $Therapeutic Activity: 8-22 mins                    Hendricks Limes, PT 05/15/20, 11:07 AM

## 2020-05-15 NOTE — Progress Notes (Signed)
PMT chart review. Attempted to call guardian Cameron Ford on cell phone and work number listed. No answer. Secure voicemail left requesting return call to discuss goals of care. Thank you.   NO CHARGE  Vennie Homans, DNP, FNP-C Palliative Medicine Team  Phone: [phone] Fax: [phone]

## 2020-05-15 NOTE — TOC Progression Note (Signed)
Transition of Care [facility]) - Progression Note    Patient Details  Name: Cameron Ford MRN: [id number] Date of Birth: 1945/10/24  Transition of Care [facility]) CM/SW Contact  Trenton Founds, RN Phone Number: 05/15/2020, 11:21 AM  Clinical Narrative:   RNCM reached out to patient's guardian to relay bed options. After some discussion she ultimately chose [facility]. RNCM accepted bed in hub and notified Revonda Standard with facility. Per Revonda Standard they will be able to take patient on Monday.          Expected Discharge Plan and Services                                                 Social Determinants of Health (SDOH) Interventions    Readmission Risk Interventions No flowsheet data found.

## 2020-05-15 NOTE — Plan of Care (Signed)
Patient remains at baseline concerning activity. He is in stable condition. No complaints of pain or discomfort. One to two person assist in bed. Male condom catheter in place. Pt ate most of his applesauce when taking pills. He remains safe from injury. Breathing is even and unlabored unless he is moved into different position at which time it is slightly labored. Will continue to monitor.  Problem: Activity: Goal: Risk for activity intolerance will decrease Outcome: Progressing   Problem: Nutrition: Goal: Adequate nutrition will be maintained Outcome: Progressing   Problem: Pain Managment: Goal: General experience of comfort will improve Outcome: Progressing   Problem: Safety: Goal: Ability to remain free from injury will improve Outcome: Progressing

## 2020-05-15 NOTE — Progress Notes (Addendum)
[facility] Liaison note:  New referral for [employer] community based Palliative to follow post discharge at the [facility] in [place],  received from Palliative NP Vennie Homans, [facility] Thea Gist made aware. Patient informatiun sent to referral. Liaison to follow for discharge disposition. Thank you. Dayna Barker BSN, RN, [facility] [employer] [phone]
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
## 2020-05-16 DIAGNOSIS — I2699 Other pulmonary embolism without acute cor pulmonale: Secondary | ICD-10-CM | POA: Diagnosis not present

## 2020-05-16 DIAGNOSIS — I5031 Acute diastolic (congestive) heart failure: Secondary | ICD-10-CM | POA: Diagnosis not present

## 2020-05-16 DIAGNOSIS — G9341 Metabolic encephalopathy: Secondary | ICD-10-CM | POA: Diagnosis not present

## 2020-05-16 DIAGNOSIS — J9601 Acute respiratory failure with hypoxia: Secondary | ICD-10-CM | POA: Diagnosis not present

## 2020-05-16 LAB — BASIC METABOLIC PANEL
Anion gap: 7 (ref 5–15)
BUN: 24 mg/dL — ABNORMAL HIGH (ref 8–23)
CO2: 32 mmol/L (ref 22–32)
Calcium: 9.3 mg/dL (ref 8.9–10.3)
Chloride: 96 mmol/L — ABNORMAL LOW (ref 98–111)
Creatinine, Ser: 0.73 mg/dL (ref 0.61–1.24)
GFR, Estimated: >60 mL/min (ref >60)
Glucose, Bld: 103 mg/dL — ABNORMAL HIGH (ref 70–99)
Potassium: 4.5 mmol/L (ref 3.5–5.1)
Sodium: 135 mmol/L (ref 135–145)

## 2020-05-16 LAB — CBC
HCT: 32.9 % — ABNORMAL LOW (ref 39.0–52.0)
Hemoglobin: 10.8 g/dL — ABNORMAL LOW (ref 13.0–17.0)
MCH: 33 pg (ref 26.0–34.0)
MCHC: 32.8 g/dL (ref 30.0–36.0)
MCV: 100.6 fL — ABNORMAL HIGH (ref 80.0–100.0)
Platelets: 403 10*3/uL — ABNORMAL HIGH (ref 150–400)
RBC: 3.27 MIL/uL — ABNORMAL LOW (ref 4.22–5.81)
RDW: 14.6 % (ref 11.5–15.5)
WBC: 6.6 10*3/uL (ref 4.0–10.5)
nRBC: 0 % (ref 0.0–0.2)

## 2020-05-16 LAB — MAGNESIUM: Magnesium: 2.2 mg/dL (ref 1.7–2.4)

## 2020-05-16 MED ORDER — SENNOSIDES-DOCUSATE SODIUM 8.6-50 MG PO TABS
2.0000 | ORAL_TABLET | Freq: Two times a day (BID) | ORAL | Status: DC
  Administered 2020-05-16 – 2020-05-19 (×6): 2 via ORAL
  Filled 2020-05-16 (×7): qty 2

## 2020-05-16 NOTE — Plan of Care (Signed)
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
## 2020-05-17 DIAGNOSIS — I5031 Acute diastolic (congestive) heart failure: Secondary | ICD-10-CM | POA: Diagnosis not present

## 2020-05-17 DIAGNOSIS — I2699 Other pulmonary embolism without acute cor pulmonale: Secondary | ICD-10-CM | POA: Diagnosis not present

## 2020-05-17 DIAGNOSIS — J9601 Acute respiratory failure with hypoxia: Secondary | ICD-10-CM | POA: Diagnosis not present

## 2020-05-17 DIAGNOSIS — E871 Hypo-osmolality and hyponatremia: Secondary | ICD-10-CM
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
## 2020-05-17 NOTE — Plan of Care (Signed)

## 2020-05-18 ENCOUNTER — Inpatient Hospital Stay: Payer: Medicare Other

## 2020-05-18 DIAGNOSIS — I2699 Other pulmonary embolism without acute cor pulmonale: Secondary | ICD-10-CM | POA: Diagnosis not present

## 2020-05-18 DIAGNOSIS — G9341 Metabolic encephalopathy: Secondary | ICD-10-CM | POA: Diagnosis not present

## 2020-05-18 DIAGNOSIS — J441 Chronic obstructive pulmonary disease with (acute) exacerbation: Secondary | ICD-10-CM | POA: Diagnosis not present

## 2020-05-18 DIAGNOSIS — J9601 Acute respiratory failure with hypoxia: Secondary | ICD-10-CM | POA: Diagnosis not present

## 2020-05-18 MED ORDER — LACTULOSE 10 GM/15ML PO SOLN
20.0000 g | Freq: Two times a day (BID) | ORAL | Status: AC
  Administered 2020-05-18 (×2): 20 g via ORAL
  Filled 2020-05-18 (×2): qty 30

## 2020-05-18 NOTE — Progress Notes (Addendum)
PROGRESS NOTE    Cameron Ford  RXV:[id number] DOB: [birth year] DOA: 05/03/2020 PCP: Kaleen Mask, MD   Chief complaint: shortness of breath. Brief Narrative:  Patient from a group home and was admitted with confusion. Initially diagnosed with UTI and was on Rocephin. E. coli grew out of the urine culture. The patient also diagnosed with acute pulmonary embolism with right heart strain and is now on Eliquis. Also has acute on chronic hypoxic respiratory failure on 2 L of oxygen. Also diagnosed with COPD exacerbation possible pneumonia and will finish up prednisone and Augmentin. I also did give low-dose bisoprolol and Lasix for diastolic congestive heart failure. Physical therapy recommends rehab.Pending SNF placement.   Assessment & Plan:   Principal Problem:   Acute pulmonary embolism (HCC) Active Problems:   Constipation   Essential hypertension   Acute metabolic encephalopathy   Acute cystitis   AKI (acute kidney injury) (HCC)   Acute respiratory failure with hypoxia (HCC)   Acute diastolic CHF (congestive heart failure) (HCC)   E. coli UTI   Weakness   COPD with acute exacerbation (HCC)   Hemorrhoids   Dementia without behavioral disturbance (HCC)   Palliative care by specialist   Goals of care, counseling/discussion   Hyponatremia  #1. Acute hypoxemic respiratory failure secondary to acute pulmonary embolisms and acute on chronic diastolic congestive heart failure. Acute pulm emboli with right heart strain. Acute diastolic congestive heart failure. COPD exacerbation. Recurrent aspiration Patient condition has been stabilized.  Pending nursing home placement.  No change in treatment today.  Continue adequate for anticoagulation. Spoke with the speech therapist, modified barium swallow showed significant silent aspiration.  Patient may not be able to compliant with nectar thickened liquid due to mental illness.  His long-term prognosis is very poor, he can  have recurrent aspiration pneumonia.  Reach out to palliative care again, consider hospice.  #2.  Bipolar disorder and dementia. Patient has no agitation.  Condition stable.   DVT prophylaxis: Eliquis Code Status: Full Family Communication:  Disposition Plan:     Status is: Inpatient  Remains inpatient appropriate because:Unsafe d/c plan   Dispo:             Patient From: Group Home             Planned Disposition: Skilled Nursing Facility             Expected discharge date: 05/19/2020             Medically stable for discharge: Yes        I/O last 3 completed shifts: In: 960 [P.O.:960] Out: 2775 [Urine:2775] No intake/output data recorded.     Consultants:   None  Procedures: None  Antimicrobials: None  Subjective: Patient doing well, no significant short of breath.  No wheezing.  Pending modified barium swallow. Patient has no fever or chills. No abdominal pain nausea vomiting.  Objective: Vitals:   05/17/20 0513 05/17/20 0858 05/17/20 1617 05/18/20 0748  BP: (!) 105/41 138/67 (!) 97/45 111/60  Pulse: (!) 52 66 (!) 56 98  Resp: 16 16 16 16   Temp: 98.1 F (36.7 C) 97.7 F (36.5 C) 97.6 F (36.4 C) 97.8 F (36.6 C)  TempSrc:    Oral  SpO2: 99% 95% 91% 94%  Weight:      Height:        Intake/Output Summary (Last 24 hours) at 05/18/2020 1114 Last data filed at 05/18/2020 0611 Gross per 24 hour  Intake 480 ml  Output 1975 ml  Net -1495 ml   Filed Weights   05/13/20 0136 05/14/20 0500 05/15/20 0117  Weight: 75.9 kg 75.4 kg 75.9 kg    Examination:  General exam: Appears calm and comfortable  Respiratory system: Decreased breath sounds. Respiratory effort normal. Cardiovascular system: S1 & S2 heard, RRR. No JVD, murmurs, rubs, gallops or clicks. No pedal edema. Gastrointestinal system: Abdomen is nondistended, soft and nontender. No organomegaly or masses felt. Normal bowel sounds heard. Central nervous system: Alert and oriented x2.  No focal neurological deficits. Extremities: Symmetric 5 x 5 power. Skin: No rashes, lesions or ulcers Psychiatry: Mood & affect appropriate.     Data Reviewed: I have personally reviewed following labs and imaging studies  CBC: Recent Labs  Lab 05/12/20 0533 05/16/20 0455  WBC 6.5 6.6  HGB 10.7* 10.8*  HCT 33.0* 32.9*  MCV 101.2* 100.6*  PLT 363 403*   Basic Metabolic Panel: Recent Labs  Lab 05/12/20 0533 05/16/20 0455  NA 133* 135  K 4.5 4.5  CL 91* 96*  CO2 33* 32  GLUCOSE 99 103*  BUN 25* 24*  CREATININE 0.78 0.73  CALCIUM 9.5 9.3  MG  --  2.2   GFR: Estimated Creatinine Clearance: 81.1 mL/min (by C-G formula based on SCr of 0.73 mg/dL). Liver Function Tests: No results for input(s): AST, ALT, ALKPHOS, BILITOT, PROT, ALBUMIN in the last 168 hours. No results for input(s): LIPASE, AMYLASE in the last 168 hours. No results for input(s): AMMONIA in the last 168 hours. Coagulation Profile: No results for input(s): INR, PROTIME in the last 168 hours. Cardiac Enzymes: No results for input(s): CKTOTAL, CKMB, CKMBINDEX, TROPONINI in the last 168 hours. BNP (last 3 results) No results for input(s): PROBNP in the last 8760 hours. HbA1C: No results for input(s): HGBA1C in the last 72 hours. CBG: No results for input(s): GLUCAP in the last 168 hours. Lipid Profile: No results for input(s): CHOL, HDL, LDLCALC, TRIG, CHOLHDL, LDLDIRECT in the last 72 hours. Thyroid Function Tests: No results for input(s): TSH, T4TOTAL, FREET4, T3FREE, THYROIDAB in the last 72 hours. Anemia Panel: No results for input(s): VITAMINB12, FOLATE, FERRITIN, TIBC, IRON, RETICCTPCT in the last 72 hours. Sepsis Labs: No results for input(s): PROCALCITON, LATICACIDVEN in the last 168 hours.  No results found for this or any previous visit (from the past 240 hour(s)).       Radiology Studies: No results found.      Scheduled Meds: . apixaban  5 mg Oral BID  . bisoprolol  2.5 mg  Oral Daily  . carbamazepine  200 mg Oral BID  . furosemide  10 mg Oral Daily  . hydrocortisone   Topical QID  . lithium carbonate  150 mg Oral BID WC  . loratadine  10 mg Oral Daily  . lubiprostone  8 mcg Oral BID WC  . mometasone-formoterol  2 puff Inhalation BID  . risperiDONE  1 mg Oral Q8H  . senna-docusate  2 tablet Oral BID  . tiotropium  18 mcg Inhalation Daily   Continuous Infusions:   LOS: 15 days    Time spent: 27 minutes    Marrion Coy, MD [facility]   To contact the attending provider between 7A-7P or the covering provider during after hours 7P-7A, please log into the web site [email or web address] and access using universal [place] password for that web site. If you do not have the password, please call the hospital operator.  05/18/2020, 11:14 AM

## 2020-05-18 NOTE — Progress Notes (Signed)
Physical Therapy Treatment Patient Details Name: Cameron Ford MRN: [id number] DOB: 1945-05-27 Today's Date: 05/18/2020    History of Present Illness Cameron Ford is a 75 y.o. male with medical history significant for dementia, type I bipolar disorder, benign essential tremors, hypertension, moderate persistent asthma who is admitted to [facility] on 05/03/2020 with acute hypoxic respiratory distress in the setting of acute pulmonary embolism after presenting from group home to [facility] Emergency Department for evaluation of confusion    PT Comments    Pt resting in bed upon PT arrival; agreeable to PT session.  SBA semi-supine to sitting edge of bed.  Min assist x2 for transfers and ambulation 5 feet with RW bed to recliner.  Posterior lean noted in standing activities requiring cueing and assist to correct.  Will continue to focus on strengthening and progressive functional mobility per pt tolerance.   Follow Up Recommendations  SNF     Equipment Recommendations  Rolling walker with 5" wheels;3in1 (PT)    Recommendations for Other Services       Precautions / Restrictions Precautions Precautions: Fall Restrictions Weight Bearing Restrictions: No    Mobility  Bed Mobility Overal bed mobility: Needs Assistance Bed Mobility: Supine to Sit     Supine to sit: Supervision   General bed mobility comments: increased effort and time to perform on own  Transfers Overall transfer level: Needs assistance Equipment used: Rolling walker (2 wheeled) Transfers: Sit to/from Stand Sit to Stand: Min assist;+2 physical assistance         General transfer comment: vc's for UE placement; assist to come to upright up to walker  Ambulation/Gait Ambulation/Gait assistance: Min assist;+2 physical assistance Gait Distance (Feet): 5 Feet (bed to recliner) Assistive device: Rolling walker (2 wheeled) Gait Pattern/deviations: Step-to pattern;Trunk flexed;Shuffle Gait  velocity: decreased   General Gait Details: assist to steady and upright posture d/t posterior lean; increased time to take steps   Stairs             Wheelchair Mobility    Modified Rankin (Stroke Patients Only)       Balance Overall balance assessment: Needs assistance Sitting-balance support: No upper extremity supported;Feet supported Sitting balance-Leahy Scale: Fair Sitting balance - Comments: steady static sitting; kyphotic posture   Standing balance support: Bilateral upper extremity supported;During functional activity Standing balance-Leahy Scale: Poor Standing balance comment: intermittent assist for upright posture d/t posterior lean in standing                            Cognition Arousal/Alertness: Awake/alert Behavior During Therapy: WFL for tasks assessed/performed Overall Cognitive Status: No family/caregiver present to determine baseline cognitive functioning                                 General Comments: Oriented to self.  Increased time to follow commands.      Exercises General Exercises - Lower Extremity Long Arc Quad: AROM;Strengthening;Both;10 reps;Seated Hip Flexion/Marching: AROM;Strengthening;Both;10 reps;Seated    General Comments   Nursing cleared pt for participation in physical therapy.  Pt agreeable to PT session.      Pertinent Vitals/Pain Pain Assessment: No/denies pain Pain Intervention(s): Monitored during session;Limited activity within patient's tolerance;Repositioned  Vitals (HR and O2 on 4 L via nasal cannula) stable and WFL throughout treatment session.    Home Living  Prior Function            PT Goals (current goals can now be found in the care plan section) Acute Rehab PT Goals Patient Stated Goal: to go home PT Goal Formulation: With patient Time For Goal Achievement: 05/20/20 Potential to Achieve Goals: Fair Progress towards PT goals: Progressing  toward goals    Frequency    Min 2X/week      PT Plan Current plan remains appropriate    Co-evaluation              AM-PAC PT "6 Clicks" Mobility   Outcome Measure  Help needed turning from your back to your side while in a flat bed without using bedrails?: A Little Help needed moving from lying on your back to sitting on the side of a flat bed without using bedrails?: A Little Help needed moving to and from a bed to a chair (including a wheelchair)?: A Lot Help needed standing up from a chair using your arms (e.g., wheelchair or bedside chair)?: A Little Help needed to walk in hospital room?: A Lot Help needed climbing 3-5 steps with a railing? : Total 6 Click Score: 14    End of Session Equipment Utilized During Treatment: Gait belt Activity Tolerance: Patient tolerated treatment well Patient left: in chair;with call bell/phone within reach;with chair alarm set;Other (comment) (B heels floating via pillow support) Nurse Communication: Mobility status;Precautions PT Visit Diagnosis: Muscle weakness (generalized) (M62.81);Difficulty in walking, not elsewhere classified (R26.2)     Time: 6789-3810 PT Time Calculation (min) (ACUTE ONLY): 26 min  Charges:  $Therapeutic Activity: 23-37 mins                     Hendricks Limes, PT 05/18/20, 3:40 PM
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
## 2020-05-19 DIAGNOSIS — J441 Chronic obstructive pulmonary disease with (acute) exacerbation: Secondary | ICD-10-CM | POA: Diagnosis not present

## 2020-05-19 DIAGNOSIS — J9601 Acute respiratory failure with hypoxia: Secondary | ICD-10-CM | POA: Diagnosis not present

## 2020-05-19 DIAGNOSIS — I2699 Other pulmonary embolism without acute cor pulmonale: Secondary | ICD-10-CM | POA: Diagnosis not present

## 2020-05-19 DIAGNOSIS — I5031 Acute diastolic (congestive) heart failure: Secondary | ICD-10-CM | POA: Diagnosis not present

## 2020-05-19 LAB — RESP PANEL BY RT-PCR (FLU A&B, COVID) ARPGX2
Influenza A by PCR: NEGATIVE
Influenza B by PCR: NEGATIVE
SARS Coronavirus 2 by RT PCR: NEGATIVE

## 2020-05-19 MED ORDER — FUROSEMIDE 20 MG PO TABS: 10.0000 mg | ORAL_TABLET | Freq: Every day | ORAL | Status: AC

## 2020-05-19 MED ORDER — BISOPROLOL FUMARATE 5 MG PO TABS: 2.5000 mg | ORAL_TABLET | Freq: Every day | ORAL | Status: AC

## 2020-05-19 MED ORDER — APIXABAN 5 MG PO TABS: 5.0000 mg | ORAL_TABLET | Freq: Two times a day (BID) | ORAL | Status: AC

## 2020-05-19 MED ORDER — IPRATROPIUM-ALBUTEROL 0.5-2.5 (3) MG/3ML IN SOLN: 3.0000 mL | RESPIRATORY_TRACT | Status: AC | PRN
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
## 2020-05-19 NOTE — Care Management Important Message (Signed)
Important Message  Patient Details  Name: Cameron Ford MRN: [id number] Date of Birth: [birth year]   Medicare Important Message Given:  Yes  Talked with legal guardian, Cameron Ford at 11:06 am and reviewed the Important Message from [facility].  She is in agreement with the discharge plan.   [name] Alexas Basulto 05/19/2020, 11:32 AM

## 2020-05-19 NOTE — Plan of Care (Signed)

## 2020-05-19 NOTE — Care Management Important Message (Signed)
Important Message  Patient Details  Name: Cameron Ford MRN: [id number] Date of Birth: Jul 08, 1945   Medicare Important Message Given:  Other (see comment)  Left a message for patients legal guardian, [facility] [phone]. Since patient is scheduled for discharge today and [name] did not answer or return previous call I left a voice message for her supervisor, [name] at [phone] as directed on her voicemail. Will await return call to review Important Message from Medicare. Did leave reason for this call to review this document and requested a return call.   [name] Alecsander Hattabaugh 05/19/2020, 10:26 AM

## 2020-05-19 NOTE — TOC Transition Note (Signed)
Transition of Care [facility]) - CM/SW Discharge Note   Patient Details  Name: Cameron Ford MRN: [id number] Date of Birth: 11/29/1945  Transition of Care [facility]) CM/SW Contact:  Trenton Founds, RN Phone Number: 05/19/2020, 2:41 PM   Clinical Narrative:   Patient discharging to [facility] by EMS. Discharge paperwork completed and report to be called to [phone], room 107A. RNCM updated PASSR information and call for transport. [facility] scheduled for around 2pm.           Patient Goals and CMS Choice        Discharge Placement                       Discharge Plan and Services                                     Social Determinants of Health (SDOH) Interventions     Readmission Risk Interventions No flowsheet data found.

## 2020-05-19 NOTE — Progress Notes (Signed)
PMT provider chart reviewed. Spoke with Dr. Chipper Herb. SLP has evaluated and patient is unfortunately silently aspirating. Long-term prognosis is poor with risk for recurrent aspiration pneumonia. Needs further GOC discussion. Call placed to DSS legal guardian, Cameron Ford, requesting follow-up GOC discussion. No answer. VM left.   Plan is for outpatient palliative f/u at [facility] rehab. Please review initial PMT consult from 1/14. Cameron Ford understands the importance of discussing Cameron Ford goals and wishes moving forward. She plans to discuss MOST form and consideration of DNR when she visits him. This will likely be at SNF, especially if discharged tomorrow. Thank you.   NO CHARGE  Cameron Homans, DNP, FNP-C Palliative Medicine Team  Phone: [phone] Fax: [phone]

## 2020-05-19 NOTE — Progress Notes (Signed)
Dementia is patient's primary impairment diagnosis and supercedes mental illness.

## 2020-05-19 NOTE — Plan of Care (Signed)
  Problem: Education: Goal: Ability to demonstrate management of disease process will improve Outcome: Adequate for Discharge Goal: Ability to verbalize understanding of medication therapies will improve Outcome: Adequate for Discharge Goal: Individualized Educational Video(s) Outcome: Adequate for Discharge   Problem: Activity: Goal: Capacity to carry out activities will improve Outcome: Adequate for Discharge   Problem: Cardiac: Goal: Ability to achieve and maintain adequate cardiopulmonary perfusion will improve Outcome: Adequate for Discharge   Problem: Acute Rehab OT Goals (only OT should resolve) Goal: Pt. Will Perform Eating Outcome: Adequate for Discharge Goal: Pt. Will Perform Grooming Outcome: Adequate for Discharge Goal: Pt. Will Transfer To Toilet Outcome: Adequate for Discharge   Problem: Acute Rehab PT Goals(only PT should resolve) Goal: Pt Will Go Supine/Side To Sit Outcome: Adequate for Discharge Goal: Pt Will Transfer Bed To Chair/Chair To Bed Outcome: Adequate for Discharge Goal: Pt Will Ambulate Outcome: Adequate for Discharge

## 2020-06-08 ENCOUNTER — Non-Acute Institutional Stay: Payer: Self-pay | Admitting: Primary Care

## 2020-06-08 ENCOUNTER — Other Ambulatory Visit: Payer: Self-pay

## 2020-06-08 DIAGNOSIS — F0390 Unspecified dementia without behavioral disturbance: Secondary | ICD-10-CM

## 2020-06-08 DIAGNOSIS — J441 Chronic obstructive pulmonary disease with (acute) exacerbation: Secondary | ICD-10-CM

## 2020-06-08 DIAGNOSIS — Z515 Encounter for palliative care: Secondary | ICD-10-CM

## 2020-06-08 NOTE — Progress Notes (Addendum)
[facility] Consult Note Telephone: [phone]  Fax: [phone]     Date of encounter: 06/08/20 PATIENT NAME: Cameron Ford [place] [address] [phone] (home)  DOB: May 11, 1945 MRN: [id number]  PRIMARY CARE PROVIDER:    Bonnita Nasuti, MD [address] [place],  [place] [address] [phone]  REFERRING PROVIDER:   Bonnita Nasuti, MD [address] [place],  [place] [address] [phone]  RESPONSIBLE PARTY:   Extended Emergency Contact Information Primary Emergency Contact: [place], Dent Phone: [phone] Work Phone: [phone] Mobile Phone: [phone] Relation: Legal Guardian Secondary Emergency Contact: Spruill,Hazel Address: [address]          [place],  [address] Johnnette Litter of [place] Phone: [phone] Relation: Other  I met face to face with patient in facility. Palliative Care was asked to follow this patient by consultation request of Hague, Rosalyn Charters, MD to help address advance care planning and goals of care. This is initial visit.   ASSESSMENT AND RECOMMENDATIONS:   1. Advance Care Planning/Goals of Care: Goals include to maximize quality of life and symptom management. Our advance care planning conversation included a discussion about:     The value and importance of advance care planning   Exploration of goals of care in the event of a sudden injury or illness   Identification and preparation of a healthcare agent - DSS  Review of an  advance directive document . I spoke with Robby Sermon of the department social services. We discuss advance directives. She will receive a most  form  by mail and return it. The hard copy in the chart was a copy with no physician signature. [name] said they would elect full scope of medical services. Patient will remain at the [facility] center to live, on going. She stated that this is  already been arranged. She will remain his guardian for the time being.  2. Symptom Management:   I met with Mr. [facility] in his nursing home room. He's currently on day seven of 10 days of isolation from testing positive from Covid. He is asymptomatic. His history is that he has a legal guardian and will likely remain at the facility for long-term care.   Patient denies any pain or insomnia. He exhibits tremors, anxiety, and pressured speech. He has a long-term psychiatric history with incarceration. He states his appetite is good and then he feels OK but  he's tired of being in this room. He has a MOST on file which is a copy and no physician signature. It requires full scope of services. I have reached out to [facility] [place] who is his guardian.  He may need to have guardianship transferred if he ends up residing in [place]. Staff reports that he has not had any serious sequelae of Covid and that he is at his baseline for mentation, intake,  ADLs etc.   3. Follow up Palliative Care Visit: Palliative care will continue to follow for goals of care clarification and symptom management. Return 6 weeks or prn.  4. Family /Caregiver/Community Supports: Lives in [place] with [place] guardian  5. Cognitive / Functional decline:  A and o x 2, dependent in all adls and iadls.   I spent 35 minutes providing this consultation,  from 1200 to 1235. More than 50% of the time in this consultation was spent in counseling and care coordination.  CODE STATUS:  FULL PPS: 50%  HOSPICE ELIGIBILITY/DIAGNOSIS: no  Subjective:  CHIEF COMPLAINT: debility  HISTORY OF PRESENT ILLNESS:  Cameron Ford is a 75 y.o. year old male  with heart disease, copd, recent covid infection, dementia. Increased debility in context of recent chronic disease exacerbations. Dependent in adls and ialds, and requires LTC placement.  We are asked to consult around advance care planning and complex medical decision making.     Review and summarization of old Epic records shows or history from other than patient. Review or lab tests, radiology,  or medicine Labs from hospital stay. Review of case with family member DSS guardian.  History obtained from review of EMR, discussion with primary team, and  interview with family, caregiver  and/or Mr. Finken. Records reviewed and summarized above.   CURRENT PROBLEM LIST:  Patient Active Problem List   Diagnosis Date Noted  . Hyponatremia 05/17/2020  . Palliative care by specialist   . Goals of care, counseling/discussion   . Dementia without behavioral disturbance ([place])   . Hemorrhoids   . COPD with acute exacerbation ([place])   . Acute respiratory failure with hypoxia ([place])   . Acute diastolic CHF (congestive heart failure) ([place])   . E. coli UTI   . Weakness   . Acute metabolic encephalopathy 11/94/1740  . Acute pulmonary embolism ([place]) 05/03/2020  . Acute cystitis 05/03/2020  . AKI (acute kidney injury) ([place]) 05/03/2020  . Pain due to onychomycosis of toenails of both feet 04/15/2019  . SBO (small bowel obstruction) ([place]) 08/31/2018  . Cellulitis 08/17/2016  . Constipation 02/02/2016  . HLD (hyperlipidemia) 02/02/2016  . Essential hypertension 02/02/2016  . Has a tremor 02/02/2016  . GI bleed 03/12/2015   PAST MEDICAL HISTORY:  Active Ambulatory Problems    Diagnosis Date Noted  . GI bleed 03/12/2015  . Constipation 02/02/2016  . HLD (hyperlipidemia) 02/02/2016  . Essential hypertension 02/02/2016  . Has a tremor 02/02/2016  . Cellulitis 08/17/2016  . SBO (small bowel obstruction) ([place]) 08/31/2018  . Pain due to onychomycosis of toenails of both feet 04/15/2019  . Acute metabolic encephalopathy 81/44/8185  . Acute pulmonary embolism ([place]) 05/03/2020  . Acute cystitis 05/03/2020  . AKI (acute kidney injury) ([place]) 05/03/2020  . Acute respiratory failure with hypoxia ([place])   . Acute diastolic CHF (congestive heart failure) ([place])   . E. coli UTI    . Weakness   . COPD with acute exacerbation ([place])   . Hemorrhoids   . Dementia without behavioral disturbance ([place])   . Palliative care by specialist   . Goals of care, counseling/discussion   . Hyponatremia 05/17/2020   Resolved Ambulatory Problems    Diagnosis Date Noted  . No Resolved Ambulatory Problems   Past Medical History:  Diagnosis Date  . Asthma   . Bipolar 1 disorder ([employer])   . Chronic constipation   . Dementia ([place])   . History of small bowel obstruction   . Hyperlipidemia   . Hypertension   . Microalbuminuria   . Neuromuscular disorder ([place])   . Stroke [facility])    SOCIAL HX:  Social History   Tobacco Use  . Smoking status: Former Smoker    Packs/day: 1.50    Quit date: 02/01/2016    Years since quitting: 4.3  . Smokeless tobacco: Never Used  Substance Use Topics  . Alcohol use: No   FAMILY HX:  Family History  Problem Relation Age of Onset  . Diabetes Mellitus II Neg Hx   .  Hypertension Neg Hx   . Hyperlipidemia Neg Hx   . Hypothyroidism Neg Hx       ALLERGIES: No Known Allergies   PERTINENT MEDICATIONS:  Outpatient Encounter Medications as of 06/08/2020  Medication Sig  . acetaminophen (TYLENOL) 325 MG tablet Take 650 mg by mouth 2 (two) times daily.  Marland Kitchen apixaban (ELIQUIS) 5 MG TABS tablet Take 1 tablet (5 mg total) by mouth 2 (two) times daily.  Marland Kitchen atorvastatin (LIPITOR) 20 MG tablet Take 20 mg by mouth daily.   . bisoprolol (ZEBETA) 5 MG tablet Take 0.5 tablets (2.5 mg total) by mouth daily.  . budesonide-formoterol (SYMBICORT) 80-4.5 MCG/ACT inhaler Inhale 2 puffs into the lungs 2 (two) times daily.  . carbamazepine (TEGRETOL) 200 MG tablet Take 200 mg by mouth 2 (two) times daily.   . furosemide (LASIX) 20 MG tablet Take 0.5 tablets (10 mg total) by mouth daily.  Marland Kitchen ipratropium-albuterol (DUONEB) 0.5-2.5 (3) MG/3ML SOLN Take 3 mLs by nebulization every 4 (four) hours as needed.  . lithium carbonate 150 MG capsule Take 150 mg by mouth 2 (two) times  daily with a meal.  . lubiprostone (AMITIZA) 8 MCG capsule Take 8 mcg by mouth 2 (two) times daily with a meal.   . polyethylene glycol (MIRALAX / GLYCOLAX) packet Take 17 g by mouth 2 (two) times daily.  . risperiDONE (RISPERDAL) 1 MG tablet Take 1 mg by mouth every 8 (eight) hours.  . risperiDONE microspheres (RISPERDAL CONSTA) 25 MG injection Inject 25 mg into the muscle every 30 (thirty) days.  . silver sulfADIAZINE (SILVADENE) 1 % cream Apply 1 application topically daily.  . vitamin B-12 (CYANOCOBALAMIN) 1000 MCG tablet Take 1,000 mcg by mouth daily.   No facility-administered encounter medications on file as of 06/08/2020.    Objective: ROS/staff  General: NAD ENMT: endorses dysphagia, nectar thick liquids. Cardiovascular: denies chest pain Pulmonary: denies  cough, denies increased SOB Abdomen: endorses good  appetite, denies  constipation, endorses continence of bowel GU: denies dysuria, endorses continence of urine MSK:  endorses ROM limitations, no falls reported Skin: denies rashes or wounds Neurological: endorses weakness, denies pain, denies insomnia Psych: Endorses angry mood Heme/lymph/immuno: denies bruises, abnormal bleeding  Physical Exam: Current and past weights: 166 lbs 1/21/ Constitutional: NAD General: frail appearing, WNWD EYES: anicteric sclera, lids intact, no discharge  ENMT: intact hearing,oral mucous membranes moist CV: , slight  LE edema Pulmonary: no increased work of breathing, no cough, no audible wheezes, room air Abdomen: intake 75%,  no ascites GU: deferred MSK: mild sarcopenia, decreased ROM in all extremities, no contractures of LE, non ambulatory, able to mobilize in w/c.  Skin: warm and dry, no rashes or wounds on visible skin Neuro: Generalized weakness, advanced  cognitive impairment Psych: anxious affect, A and O x 2 Hem/lymph/immuno: no widespread bruising   Thank you for the opportunity to participate in the care of Mr. Rawl.   The palliative care team will continue to follow. Please call our office at [phone] if we can be of additional assistance.  Jason Coop, NP , DNP, MPH, AGPCNP-BC, ACHPN   COVID-19 PATIENT SCREENING TOOL  Person answering questions: _______staff____________   1.  Is the patient or any family member in the home showing any signs or symptoms regarding respiratory infection?                  Person with Symptom  ______________na___________ a. Fever/chills/headache  Yes___ No__X_            b. Shortness of breath                                                            Yes___ No__X_           c. Cough/congestion                                               Yes___  No__X_          d. Muscle/Body aches/pains                                                   Yes___ No__X_         e. Gastrointestinal symptoms (diarrhea,nausea)             Yes___ No__X_         f. Sudden loss of smell or taste      Yes___ No__X_        2. Within the past 10 days, has anyone living in the home had any contact with someone with or under investigation for COVID-19?    Yes___ No__X__   Person __________________  

## 2020-08-03 ENCOUNTER — Non-Acute Institutional Stay: Payer: Medicare Other | Admitting: Primary Care

## 2020-08-03 ENCOUNTER — Other Ambulatory Visit: Payer: Self-pay

## 2020-08-03 DIAGNOSIS — F0390 Unspecified dementia without behavioral disturbance: Secondary | ICD-10-CM

## 2020-08-03 DIAGNOSIS — Z515 Encounter for palliative care: Secondary | ICD-10-CM

## 2020-08-03 NOTE — Progress Notes (Signed)
[facility] Palliative [facility] Consult Note Telephone: [phone]  Fax: [phone]    Date of encounter: 08/03/20 PATIENT NAME: Cameron Ford [place] [address]   [phone] (home)  DOB: January 01, 1946 MRN: [id number] PRIMARY CARE PROVIDER:    Bonnita Nasuti, MD,  [address] [place] [address] [phone]  REFERRING PROVIDER:   Bonnita Nasuti, MD [address] [place],  [place] [address] [phone]  RESPONSIBLE PARTY:    Contact Information    Name Relation Home Work [place], [place] [phone]   [place] Other [place] Other [phone]     [facility] Sister [phone]     Sim Boast Other   [phone]      I met face to face with patient  In facility. Palliative Care was asked to follow this patient by consultation request of  Hague, Cameron Charters, MD to address advance care planning and complex medical decision making. This is the follow up visit.    ASSESSMENT AND RECOMMENDATIONS:   1. Advance Care Planning/Goals of Care: Goals include to maximize quality of life and symptom management.  DSS is patient guardian.  2. Symptom Management:   I met with patient in his nursing home. Staff endorses no behavioral disturbances. They endorse he is eating well with a good appetite. Weights are stable. Patient states he never had Covid although this was documented several months ago. He has recovered from his pneumonia and does not seem to have grossly assessed sequelae from covid infection.  3. Follow up Palliative Care Visit: Palliative care will continue to follow for goals of care clarification and symptom management. Return 8 weeks or prn.  4. Family /Caregiver/Community Supports:  POA is DSS, lives in [place].  5. Cognitive / Functional decline: A and o x 1. Cognitive impairment. Dependent in  all iadls, many adls.  I spent 25 minutes providing this consultation. More than 50% of the time in this consultation was spent in counseling and care coordination.  CODE STATUS: FULL  PPS: 40%  HOSPICE ELIGIBILITY/DIAGNOSIS: TBD  CHIEF COMPLAINT: debility   HISTORY OF PRESENT ILLNESS:  Cameron Ford is a 75 y.o. year old male  with dementia, debility from recent covid infection.    History obtained from review of EMR, discussion with primary team, and  interview with family, caregiver  and/or [name]. Records reviewed and summarized above.   CURRENT PROBLEM LIST:  Patient Active Problem List   Diagnosis Date Noted  . Hyponatremia 05/17/2020  . Palliative care by specialist   . Goals of care, counseling/discussion   . Dementia without behavioral disturbance ([place])   . Hemorrhoids   . COPD with acute exacerbation ([place])   . Acute respiratory failure with hypoxia ([place])   . Acute diastolic CHF (congestive heart failure) ([place])   . E. coli UTI   . Weakness   . Acute metabolic encephalopathy 81/27/5170  . Acute pulmonary embolism ([place]) 05/03/2020  . Acute cystitis 05/03/2020  . AKI (acute kidney injury) ([place]) 05/03/2020  . Pain due to onychomycosis of toenails of both feet 04/15/2019  . SBO (small bowel obstruction) ([place]) 08/31/2018  . Cellulitis 08/17/2016  . Constipation 02/02/2016  . HLD (hyperlipidemia) 02/02/2016  . Essential hypertension 02/02/2016  . Has a tremor 02/02/2016  . GI bleed 03/12/2015   PAST MEDICAL HISTORY:  Active Ambulatory Problems    Diagnosis Date Noted  .  GI bleed 03/12/2015  . Constipation 02/02/2016  . HLD (hyperlipidemia) 02/02/2016  . Essential hypertension 02/02/2016  . Has a tremor 02/02/2016  . Cellulitis 08/17/2016  . SBO (small bowel obstruction) ([place]) 08/31/2018  . Pain due to onychomycosis of toenails of both feet 04/15/2019  . Acute metabolic encephalopathy 21/19/4174  . Acute pulmonary embolism ([place]) 05/03/2020  . Acute  cystitis 05/03/2020  . AKI (acute kidney injury) ([place]) 05/03/2020  . Acute respiratory failure with hypoxia ([place])   . Acute diastolic CHF (congestive heart failure) ([place])   . E. coli UTI   . Weakness   . COPD with acute exacerbation ([place])   . Hemorrhoids   . Dementia without behavioral disturbance ([place])   . Palliative care by specialist   . Goals of care, counseling/discussion   . Hyponatremia 05/17/2020   Resolved Ambulatory Problems    Diagnosis Date Noted  . No Resolved Ambulatory Problems   Past Medical History:  Diagnosis Date  . Asthma   . Bipolar 1 disorder ([place])   . Chronic constipation   . Dementia ([place])   . History of small bowel obstruction   . Hyperlipidemia   . Hypertension   . Microalbuminuria   . Neuromuscular disorder ([place])   . Stroke [facility])    SOCIAL HX:  Social History   Tobacco Use  . Smoking status: Former Smoker    Packs/day: 1.50    Quit date: 02/01/2016    Years since quitting: 4.5  . Smokeless tobacco: Never Used  Substance Use Topics  . Alcohol use: No   FAMILY HX:  Family History  Problem Relation Age of Onset  . Diabetes Mellitus II Neg Hx   . Hypertension Neg Hx   . Hyperlipidemia Neg Hx   . Hypothyroidism Neg Hx       ALLERGIES: No Known Allergies   PERTINENT MEDICATIONS:  Outpatient Encounter Medications as of 08/03/2020  Medication Sig  . acetaminophen (TYLENOL) 325 MG tablet Take 650 mg by mouth 2 (two) times daily.  Marland Kitchen apixaban (ELIQUIS) 5 MG TABS tablet Take 1 tablet (5 mg total) by mouth 2 (two) times daily.  Marland Kitchen atorvastatin (LIPITOR) 20 MG tablet Take 20 mg by mouth daily.   . bisoprolol (ZEBETA) 5 MG tablet Take 0.5 tablets (2.5 mg total) by mouth daily.  . budesonide-formoterol (SYMBICORT) 80-4.5 MCG/ACT inhaler Inhale 2 puffs into the lungs 2 (two) times daily.  . carbamazepine (TEGRETOL) 200 MG tablet Take 200 mg by mouth 2 (two) times daily.   . furosemide (LASIX) 20 MG tablet Take 0.5 tablets (10 mg total) by mouth  daily.  Marland Kitchen ipratropium-albuterol (DUONEB) 0.5-2.5 (3) MG/3ML SOLN Take 3 mLs by nebulization every 4 (four) hours as needed.  . lithium carbonate 150 MG capsule Take 150 mg by mouth 2 (two) times daily with a meal.  . lubiprostone (AMITIZA) 8 MCG capsule Take 8 mcg by mouth 2 (two) times daily with a meal.   . polyethylene glycol (MIRALAX / GLYCOLAX) packet Take 17 g by mouth 2 (two) times daily.  . risperiDONE (RISPERDAL) 1 MG tablet Take 1 mg by mouth every 8 (eight) hours.  . risperiDONE microspheres (RISPERDAL CONSTA) 25 MG injection Inject 25 mg into the muscle every 30 (thirty) days.  . silver sulfADIAZINE (SILVADENE) 1 % cream Apply 1 application topically daily.  . vitamin B-12 (CYANOCOBALAMIN) 1000 MCG tablet Take 1,000 mcg by mouth daily.   No facility-administered encounter medications on file as of 08/03/2020.     ROS  General: NAD ENMT: denies dysphagia Pulmonary: denies cough, denies increased SOB Abdomen: endorses good appetite GU: denies dysuria, endorses continence of urine MSK: denies weakness,  no falls reported Skin: denies rashes or wounds Neurological: denies pain, denies insomnia Psych: Endorses positive mood  Physical Exam: Current and past weights: stable Constitutional:NAD General: frail appearing, WNWD EYES: anicteric sclera, lids intact, no discharge  ENMT: intact hearing, oral mucous membranes moist, dentition intact CV:  no LE edema Pulmonary: no increased work of breathing, no cough, room air Abdomen: intake 100%,  no ascites MSK: moderate  sarcopenia, moves all extremities Skin: warm and dry, no rashes or wounds on visible skin Neuro:  + generalized weakness,  + cognitive impairment Psych: non-anxious affect, A and O x 1-2 Hem/lymph/immuno: no widespread bruising   Thank you for the opportunity to participate in the care of Mr. Borgmeyer.  The palliative care team will continue to follow. Please call our office at [phone] if we can be of  additional assistance.   Jason Coop, NP , DNP, MPH, AGPCNP-BC, ACHPN    COVID-19 PATIENT SCREENING TOOL Asked and negative response unless otherwise noted:   Have you had symptoms of covid, tested positive or been in contact with someone with symptoms/positive test in the past 5-10 days?

## 2020-10-09 ENCOUNTER — Inpatient Hospital Stay (HOSPITAL_COMMUNITY)
Admission: EM | Admit: 2020-10-09 | Discharge: 2020-10-19 | DRG: 190 | Disposition: A | Discharge status: Skilled Nursing Facility | Payer: Medicare Other | Attending: Internal Medicine | Admitting: Internal Medicine

## 2020-10-09 ENCOUNTER — Encounter (HOSPITAL_COMMUNITY): Payer: Self-pay | Admitting: Emergency Medicine

## 2020-10-09 ENCOUNTER — Emergency Department (HOSPITAL_COMMUNITY): Payer: Medicare Other

## 2020-10-09 ENCOUNTER — Other Ambulatory Visit: Payer: Self-pay

## 2020-10-09 DIAGNOSIS — I10 Essential (primary) hypertension: Secondary | ICD-10-CM | POA: Diagnosis present

## 2020-10-09 DIAGNOSIS — J439 Emphysema, unspecified: Secondary | ICD-10-CM | POA: Diagnosis not present

## 2020-10-09 DIAGNOSIS — R4689 Other symptoms and signs involving appearance and behavior: Secondary | ICD-10-CM

## 2020-10-09 DIAGNOSIS — R0902 Hypoxemia: Secondary | ICD-10-CM

## 2020-10-09 DIAGNOSIS — J441 Chronic obstructive pulmonary disease with (acute) exacerbation: Secondary | ICD-10-CM | POA: Diagnosis present

## 2020-10-09 DIAGNOSIS — Z8673 Personal history of transient ischemic attack (TIA), and cerebral infarction without residual deficits: Secondary | ICD-10-CM

## 2020-10-09 DIAGNOSIS — Z86711 Personal history of pulmonary embolism: Secondary | ICD-10-CM

## 2020-10-09 DIAGNOSIS — N390 Urinary tract infection, site not specified: Secondary | ICD-10-CM | POA: Diagnosis not present

## 2020-10-09 DIAGNOSIS — I2699 Other pulmonary embolism without acute cor pulmonale: Secondary | ICD-10-CM | POA: Diagnosis not present

## 2020-10-09 DIAGNOSIS — Z7951 Long term (current) use of inhaled steroids: Secondary | ICD-10-CM

## 2020-10-09 DIAGNOSIS — J9601 Acute respiratory failure with hypoxia: Secondary | ICD-10-CM | POA: Diagnosis not present

## 2020-10-09 DIAGNOSIS — F0391 Unspecified dementia with behavioral disturbance: Secondary | ICD-10-CM | POA: Diagnosis present

## 2020-10-09 DIAGNOSIS — Z79899 Other long term (current) drug therapy: Secondary | ICD-10-CM

## 2020-10-09 DIAGNOSIS — Z20822 Contact with and (suspected) exposure to covid-19: Secondary | ICD-10-CM | POA: Diagnosis present

## 2020-10-09 DIAGNOSIS — Z7901 Long term (current) use of anticoagulants: Secondary | ICD-10-CM

## 2020-10-09 DIAGNOSIS — F03918 Unspecified dementia, unspecified severity, with other behavioral disturbance: Secondary | ICD-10-CM | POA: Diagnosis present

## 2020-10-09 DIAGNOSIS — F54 Psychological and behavioral factors associated with disorders or diseases classified elsewhere: Secondary | ICD-10-CM | POA: Diagnosis present

## 2020-10-09 DIAGNOSIS — I110 Hypertensive heart disease with heart failure: Secondary | ICD-10-CM | POA: Diagnosis present

## 2020-10-09 DIAGNOSIS — I2510 Atherosclerotic heart disease of native coronary artery without angina pectoris: Secondary | ICD-10-CM | POA: Diagnosis present

## 2020-10-09 DIAGNOSIS — Z87891 Personal history of nicotine dependence: Secondary | ICD-10-CM

## 2020-10-09 DIAGNOSIS — I5032 Chronic diastolic (congestive) heart failure: Secondary | ICD-10-CM | POA: Diagnosis present

## 2020-10-09 DIAGNOSIS — F319 Bipolar disorder, unspecified: Secondary | ICD-10-CM | POA: Diagnosis present

## 2020-10-09 DIAGNOSIS — E785 Hyperlipidemia, unspecified: Secondary | ICD-10-CM | POA: Diagnosis present

## 2020-10-09 HISTORY — DX: Chronic obstructive pulmonary disease, unspecified: J44.9

## 2020-10-09 HISTORY — DX: Heart failure, unspecified: I50.9

## 2020-10-09 LAB — COMPREHENSIVE METABOLIC PANEL
ALT: 16 U/L (ref 0–44)
AST: 20 U/L (ref 15–41)
Albumin: 3.2 g/dL — ABNORMAL LOW (ref 3.5–5.0)
Alkaline Phosphatase: 63 U/L (ref 38–126)
Anion gap: 8 (ref 5–15)
BUN: 24 mg/dL — ABNORMAL HIGH (ref 8–23)
CO2: 31 mmol/L (ref 22–32)
Calcium: 9.1 mg/dL (ref 8.9–10.3)
Chloride: 101 mmol/L (ref 98–111)
Creatinine, Ser: 0.85 mg/dL (ref 0.61–1.24)
GFR, Estimated: >60 mL/min (ref >60)
Glucose, Bld: 99 mg/dL (ref 70–99)
Potassium: 3.6 mmol/L (ref 3.5–5.1)
Sodium: 140 mmol/L (ref 135–145)
Total Bilirubin: 0.4 mg/dL (ref 0.3–1.2)
Total Protein: 6.9 g/dL (ref 6.5–8.1)

## 2020-10-09 LAB — CBC WITH DIFFERENTIAL/PLATELET
Abs Immature Granulocytes: 0.03 10*3/uL (ref 0.00–0.07)
Basophils Absolute: 0 10*3/uL (ref 0.0–0.1)
Basophils Relative: 1 %
Eosinophils Absolute: 0.1 10*3/uL (ref 0.0–0.5)
Eosinophils Relative: 1 %
HCT: 36 % — ABNORMAL LOW (ref 39.0–52.0)
Hemoglobin: 11.8 g/dL — ABNORMAL LOW (ref 13.0–17.0)
Immature Granulocytes: 0 %
Lymphocytes Relative: 23 %
Lymphs Abs: 1.6 10*3/uL (ref 0.7–4.0)
MCH: 32 pg (ref 26.0–34.0)
MCHC: 32.8 g/dL (ref 30.0–36.0)
MCV: 97.6 fL (ref 80.0–100.0)
Monocytes Absolute: 0.8 10*3/uL (ref 0.1–1.0)
Monocytes Relative: 12 %
Neutro Abs: 4.3 10*3/uL (ref 1.7–7.7)
Neutrophils Relative %: 63 %
Platelets: 477 10*3/uL — ABNORMAL HIGH (ref 150–400)
RBC: 3.69 MIL/uL — ABNORMAL LOW (ref 4.22–5.81)
RDW: 14.3 % (ref 11.5–15.5)
WBC: 6.9 10*3/uL (ref 4.0–10.5)
nRBC: 0 % (ref 0.0–0.2)

## 2020-10-09 LAB — URINALYSIS, ROUTINE W REFLEX MICROSCOPIC
Bilirubin Urine: NEGATIVE
Glucose, UA: NEGATIVE mg/dL
Ketones, ur: 5 mg/dL — AB
Nitrite: NEGATIVE
Protein, ur: NEGATIVE mg/dL
Specific Gravity, Urine: 1.02 (ref 1.005–1.030)
pH: 5 (ref 5.0–8.0)

## 2020-10-09 LAB — RESP PANEL BY RT-PCR (FLU A&B, COVID) ARPGX2
Influenza A by PCR: NEGATIVE
Influenza B by PCR: NEGATIVE
SARS Coronavirus 2 by RT PCR: NEGATIVE

## 2020-10-09 LAB — RAPID URINE DRUG SCREEN, HOSP PERFORMED
Amphetamines: NOT DETECTED
Barbiturates: NOT DETECTED
Benzodiazepines: NOT DETECTED
Cocaine: NOT DETECTED
Opiates: NOT DETECTED
Tetrahydrocannabinol: NOT DETECTED

## 2020-10-09 LAB — ETHANOL: Alcohol, Ethyl (B): <10 mg/dL (ref <10)

## 2020-10-09 LAB — BRAIN NATRIURETIC PEPTIDE: B Natriuretic Peptide: 44 pg/mL (ref 0.0–100.0)

## 2020-10-09 LAB — ACETAMINOPHEN LEVEL: Acetaminophen (Tylenol), Serum: <10 ug/mL — ABNORMAL LOW (ref 10–30)

## 2020-10-09 LAB — SALICYLATE LEVEL: Salicylate Lvl: <7 mg/dL — ABNORMAL LOW (ref 7.0–30.0)

## 2020-10-09 MED ORDER — SODIUM CHLORIDE 0.9 % IV SOLN
1.0000 g | Freq: Once | INTRAVENOUS | Status: AC
  Administered 2020-10-09: 1 g via INTRAVENOUS
  Filled 2020-10-09: qty 10

## 2020-10-09 MED ORDER — SODIUM CHLORIDE 0.9 % IV SOLN: 1.0000 g | Freq: Once | INTRAVENOUS | Status: DC

## 2020-10-09 MED ORDER — CEPHALEXIN 500 MG PO CAPS: 500.0000 mg | ORAL_CAPSULE | Freq: Two times a day (BID) | ORAL | 0 refills | Status: DC

## 2020-10-09 MED ORDER — ALBUTEROL SULFATE HFA 108 (90 BASE) MCG/ACT IN AERS
4.0000 | INHALATION_SPRAY | Freq: Once | RESPIRATORY_TRACT | Status: AC
  Administered 2020-10-09: 16:00:00 4 via RESPIRATORY_TRACT
  Filled 2020-10-09: qty 6.7

## 2020-10-09 MED ORDER — PREDNISONE 50 MG PO TABS
60.0000 mg | ORAL_TABLET | Freq: Once | ORAL | Status: AC
  Administered 2020-10-09: 16:00:00 60 mg via ORAL
  Filled 2020-10-09: qty 1

## 2020-10-09 MED ORDER — IOHEXOL 350 MG/ML SOLN
100.0000 mL | Freq: Once | INTRAVENOUS | Status: AC | PRN
  Administered 2020-10-09: 100 mL via INTRAVENOUS

## 2020-10-09 NOTE — ED Provider Notes (Signed)
  Physical Exam  BP (!) 146/80 (BP Location: Right Arm)   Pulse 76   Temp 99.2 F (37.3 C) (Oral)   Resp (!) 26   Ht 5\' 9"  (1.753 m)   Wt 75.3 kg   SpO2 96%   BMI 24.51 kg/m   Physical Exam  ED Course/Procedures   Clinical Course as of 10/09/20 1928  Fri Oct 09, 2020  1448 SpO2(!): 88 % Repeat 90% which appears to be baseline for patient [MV]    Clinical Course User Index [MV] Oct 11, 2020, PA-C    Procedures  MDM  Patient was seen by Dr. Tanda Rockers with [facility] hospitalist service, medically cleared for behavioral health evaluation, recommends Keflex 500 mg twice daily x5 days.  Seen again by Dr. Randol Kern, O2 sats drop when patient is sleeping, will admit.      Randol Kern, PA-C 10/09/20 1930    12/09/20, MD 10/09/20 2237
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
## 2020-10-09 NOTE — ED Provider Notes (Signed)
  Face-to-face evaluation   History: Presents from his nursing care facility for evaluation of threatening behavior.  He is not taking his usual medications.  He has a history of urinary tract infection.  He is unable to give any history.  Physical exam: Alert elderly male.  He is confused.  No dysarthria.  No respiratory distress.  Abdomen soft and nontender.  Medical screening examination/treatment/procedure(s) were conducted as a shared visit with non-physician practitioner(s) and myself.  I personally evaluated the patient during the encounter    Mancel Bale, MD 10/10/20 [phone]

## 2020-10-09 NOTE — ED Triage Notes (Signed)
Per [facility] pt needs psych eval for making threatening statements; pt has hx of dementia and has been refusing his meds for weeks; per [facility] pt has possible UTI and reports pt has been calm and cooperative since their arrival to the facility
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
## 2020-10-10 DIAGNOSIS — I2699 Other pulmonary embolism without acute cor pulmonale: Secondary | ICD-10-CM | POA: Diagnosis not present

## 2020-10-10 DIAGNOSIS — J441 Chronic obstructive pulmonary disease with (acute) exacerbation: Secondary | ICD-10-CM | POA: Diagnosis not present

## 2020-10-10 DIAGNOSIS — F54 Psychological and behavioral factors associated with disorders or diseases classified elsewhere: Secondary | ICD-10-CM | POA: Diagnosis present

## 2020-10-10 LAB — BASIC METABOLIC PANEL
Anion gap: 6 (ref 5–15)
BUN: 18 mg/dL (ref 8–23)
CO2: 33 mmol/L — ABNORMAL HIGH (ref 22–32)
Calcium: 9.1 mg/dL (ref 8.9–10.3)
Chloride: 100 mmol/L (ref 98–111)
Creatinine, Ser: 0.64 mg/dL (ref 0.61–1.24)
GFR, Estimated: >60 mL/min (ref >60)
Glucose, Bld: 123 mg/dL — ABNORMAL HIGH (ref 70–99)
Potassium: 4.1 mmol/L (ref 3.5–5.1)
Sodium: 139 mmol/L (ref 135–145)

## 2020-10-10 LAB — CBC
HCT: 35.2 % — ABNORMAL LOW (ref 39.0–52.0)
Hemoglobin: 11.5 g/dL — ABNORMAL LOW (ref 13.0–17.0)
MCH: 32 pg (ref 26.0–34.0)
MCHC: 32.7 g/dL (ref 30.0–36.0)
MCV: 98.1 fL (ref 80.0–100.0)
Platelets: 468 10*3/uL — ABNORMAL HIGH (ref 150–400)
RBC: 3.59 MIL/uL — ABNORMAL LOW (ref 4.22–5.81)
RDW: 14.2 % (ref 11.5–15.5)
WBC: 6.1 10*3/uL (ref 4.0–10.5)
nRBC: 0 % (ref 0.0–0.2)

## 2020-10-10 MED ORDER — MOMETASONE FURO-FORMOTEROL FUM 100-5 MCG/ACT IN AERO
2.0000 | INHALATION_SPRAY | Freq: Two times a day (BID) | RESPIRATORY_TRACT | Status: DC
  Administered 2020-10-12 – 2020-10-19 (×12): 2 via RESPIRATORY_TRACT
  Filled 2020-10-10: qty 8.8

## 2020-10-10 MED ORDER — CEFDINIR 300 MG PO CAPS
300.0000 mg | ORAL_CAPSULE | Freq: Two times a day (BID) | ORAL | Status: DC
  Administered 2020-10-10 – 2020-10-12 (×4): 300 mg via ORAL
  Filled 2020-10-10 (×4): qty 1

## 2020-10-10 MED ORDER — PREDNISONE 20 MG PO TABS
40.0000 mg | ORAL_TABLET | Freq: Every day | ORAL | Status: AC
  Administered 2020-10-10 – 2020-10-14 (×5): 40 mg via ORAL
  Filled 2020-10-10 (×5): qty 2

## 2020-10-10 MED ORDER — CARBAMAZEPINE 200 MG PO TABS
200.0000 mg | ORAL_TABLET | Freq: Two times a day (BID) | ORAL | Status: DC
  Administered 2020-10-10 – 2020-10-19 (×20): 200 mg via ORAL
  Filled 2020-10-10 (×20): qty 1

## 2020-10-10 MED ORDER — ATORVASTATIN CALCIUM 20 MG PO TABS
20.0000 mg | ORAL_TABLET | Freq: Every day | ORAL | Status: DC
  Administered 2020-10-10 – 2020-10-19 (×10): 20 mg via ORAL
  Filled 2020-10-10 (×10): qty 1

## 2020-10-10 MED ORDER — VITAMIN B-12 1000 MCG PO TABS
1000.0000 ug | ORAL_TABLET | Freq: Every day | ORAL | Status: DC
  Administered 2020-10-10 – 2020-10-19 (×10): 1000 ug via ORAL
  Filled 2020-10-10 (×10): qty 1

## 2020-10-10 MED ORDER — IPRATROPIUM-ALBUTEROL 0.5-2.5 (3) MG/3ML IN SOLN: 3.0000 mL | RESPIRATORY_TRACT | Status: DC | PRN

## 2020-10-10 MED ORDER — APIXABAN 5 MG PO TABS
5.0000 mg | ORAL_TABLET | Freq: Two times a day (BID) | ORAL | Status: DC
  Administered 2020-10-10 – 2020-10-19 (×20): 5 mg via ORAL
  Filled 2020-10-10 (×20): qty 1

## 2020-10-10 MED ORDER — BISOPROLOL FUMARATE 5 MG PO TABS
2.5000 mg | ORAL_TABLET | Freq: Every day | ORAL | Status: DC
  Administered 2020-10-10 – 2020-10-19 (×10): 2.5 mg via ORAL
  Filled 2020-10-10 (×10): qty 1

## 2020-10-10 MED ORDER — IPRATROPIUM-ALBUTEROL 0.5-2.5 (3) MG/3ML IN SOLN
3.0000 mL | Freq: Four times a day (QID) | RESPIRATORY_TRACT | Status: DC
  Filled 2020-10-10: qty 3

## 2020-10-10 MED ORDER — RISPERIDONE 1 MG PO TABS
1.0000 mg | ORAL_TABLET | Freq: Three times a day (TID) | ORAL | Status: DC
  Administered 2020-10-10 – 2020-10-19 (×28): 1 mg via ORAL
  Filled 2020-10-10 (×28): qty 1

## 2020-10-10 MED ORDER — FUROSEMIDE 20 MG PO TABS
10.0000 mg | ORAL_TABLET | Freq: Every day | ORAL | Status: DC
  Administered 2020-10-10: 10 mg via ORAL
  Filled 2020-10-10: qty 1

## 2020-10-10 MED ORDER — FUROSEMIDE 20 MG PO TABS
20.0000 mg | ORAL_TABLET | Freq: Every day | ORAL | Status: DC
  Administered 2020-10-11 – 2020-10-19 (×9): 20 mg via ORAL
  Filled 2020-10-10 (×9): qty 1

## 2020-10-10 MED ORDER — LORAZEPAM 1 MG PO TABS
1.0000 mg | ORAL_TABLET | Freq: Four times a day (QID) | ORAL | Status: DC | PRN
  Administered 2020-10-10 – 2020-10-18 (×10): 1 mg via ORAL
  Filled 2020-10-10 (×10): qty 1
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
## 2020-10-10 NOTE — Progress Notes (Signed)
Notified by charge nurse, Dennison Nancy, that patient was running Vfib on the monitor. Upon entering the room patient alert and laying in bed. Obtained STAT EKG reading normal sinus rhythm, Vital signs obtained and stable. Patient does have tremors at baseline. Called CCMD who stated the monitor read Vfib when applying telemetry monitor. MD notified of questionable Vfib. No new orders at this time.

## 2020-10-11 DIAGNOSIS — I2699 Other pulmonary embolism without acute cor pulmonale: Secondary | ICD-10-CM | POA: Diagnosis not present

## 2020-10-11 DIAGNOSIS — J9601 Acute respiratory failure with hypoxia: Secondary | ICD-10-CM | POA: Diagnosis not present

## 2020-10-11 DIAGNOSIS — I10 Essential (primary) hypertension: Secondary | ICD-10-CM | POA: Diagnosis not present

## 2020-10-11 DIAGNOSIS — J441 Chronic obstructive pulmonary disease with (acute) exacerbation: Secondary | ICD-10-CM | POA: Diagnosis not present

## 2020-10-11 LAB — CBC
HCT: 35.7 % — ABNORMAL LOW (ref 39.0–52.0)
Hemoglobin: 11.9 g/dL — ABNORMAL LOW (ref 13.0–17.0)
MCH: 32.4 pg (ref 26.0–34.0)
MCHC: 33.3 g/dL (ref 30.0–36.0)
MCV: 97.3 fL (ref 80.0–100.0)
Platelets: 485 10*3/uL — ABNORMAL HIGH (ref 150–400)
RBC: 3.67 MIL/uL — ABNORMAL LOW (ref 4.22–5.81)
RDW: 14.2 % (ref 11.5–15.5)
WBC: 5.9 10*3/uL (ref 4.0–10.5)
nRBC: 0 % (ref 0.0–0.2)

## 2020-10-11 LAB — URINE CULTURE: Culture: NO GROWTH

## 2020-10-11 LAB — BASIC METABOLIC PANEL
Anion gap: 8 (ref 5–15)
BUN: 19 mg/dL (ref 8–23)
CO2: 31 mmol/L (ref 22–32)
Calcium: 8.7 mg/dL — ABNORMAL LOW (ref 8.9–10.3)
Chloride: 97 mmol/L — ABNORMAL LOW (ref 98–111)
Creatinine, Ser: 0.73 mg/dL (ref 0.61–1.24)
GFR, Estimated: >60 mL/min (ref >60)
Glucose, Bld: 121 mg/dL — ABNORMAL HIGH (ref 70–99)
Potassium: 3.2 mmol/L — ABNORMAL LOW (ref 3.5–5.1)
Sodium: 136 mmol/L (ref 135–145)

## 2020-10-11 MED ORDER — RISPERIDONE MICROSPHERES 25 MG IM SUSR: 25.0000 mg | INTRAMUSCULAR | Status: DC

## 2020-10-11 MED ORDER — MAGNESIUM OXIDE -MG SUPPLEMENT 400 (240 MG) MG PO TABS
400.0000 mg | ORAL_TABLET | Freq: Two times a day (BID) | ORAL | Status: AC
  Administered 2020-10-11 – 2020-10-13 (×4): 400 mg via ORAL
  Filled 2020-10-11 (×4): qty 1

## 2020-10-11 MED ORDER — POTASSIUM CHLORIDE CRYS ER 20 MEQ PO TBCR
40.0000 meq | EXTENDED_RELEASE_TABLET | ORAL | Status: AC
  Administered 2020-10-11 (×2): 40 meq via ORAL
  Filled 2020-10-11 (×2): qty 2

## 2020-10-11 MED ORDER — MAGNESIUM SULFATE IN D5W 1-5 GM/100ML-% IV SOLN
1.0000 g | Freq: Once | INTRAVENOUS | Status: DC
  Filled 2020-10-11: qty 100
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
## 2020-10-11 NOTE — Care Management Obs Status (Signed)
MEDICARE OBSERVATION STATUS NOTIFICATION   Patient Details  Name: Cameron Ford MRN: [id number] Date of Birth: 06-04-1945   Medicare Observation Status Notification Given:  Yes    Barry Brunner, LCSW 10/11/2020, 9:10 AM

## 2020-10-11 NOTE — BH Assessment (Signed)
Patient not medically cleared for TTS evaluation

## 2020-10-11 NOTE — Progress Notes (Signed)
Per Vivien Presto, patient meets criteria for inpatient treatment. There are no available or appropriate beds at [facility] today. CSW faxed referrals to the following facilities for review:  [facility]   TTS will continue to seek bed placement.  Crissie Reese, MSW, LCSW-A, LCAS-A Phone: [phone] Disposition/TOC
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
## 2020-10-12 DIAGNOSIS — F54 Psychological and behavioral factors associated with disorders or diseases classified elsewhere: Secondary | ICD-10-CM | POA: Diagnosis not present

## 2020-10-12 DIAGNOSIS — F0391 Unspecified dementia with behavioral disturbance: Secondary | ICD-10-CM | POA: Diagnosis present

## 2020-10-12 DIAGNOSIS — Z8673 Personal history of transient ischemic attack (TIA), and cerebral infarction without residual deficits: Secondary | ICD-10-CM | POA: Diagnosis not present

## 2020-10-12 DIAGNOSIS — Z86711 Personal history of pulmonary embolism: Secondary | ICD-10-CM | POA: Diagnosis not present

## 2020-10-12 DIAGNOSIS — F319 Bipolar disorder, unspecified: Secondary | ICD-10-CM | POA: Diagnosis present

## 2020-10-12 DIAGNOSIS — N390 Urinary tract infection, site not specified: Secondary | ICD-10-CM | POA: Diagnosis present

## 2020-10-12 DIAGNOSIS — Z20822 Contact with and (suspected) exposure to covid-19: Secondary | ICD-10-CM | POA: Diagnosis present

## 2020-10-12 DIAGNOSIS — J9601 Acute respiratory failure with hypoxia: Secondary | ICD-10-CM | POA: Diagnosis present

## 2020-10-12 DIAGNOSIS — I2510 Atherosclerotic heart disease of native coronary artery without angina pectoris: Secondary | ICD-10-CM | POA: Diagnosis present

## 2020-10-12 DIAGNOSIS — I2699 Other pulmonary embolism without acute cor pulmonale: Secondary | ICD-10-CM | POA: Diagnosis present

## 2020-10-12 DIAGNOSIS — I110 Hypertensive heart disease with heart failure: Secondary | ICD-10-CM | POA: Diagnosis present

## 2020-10-12 DIAGNOSIS — I5032 Chronic diastolic (congestive) heart failure: Secondary | ICD-10-CM | POA: Diagnosis present

## 2020-10-12 DIAGNOSIS — I10 Essential (primary) hypertension: Secondary | ICD-10-CM | POA: Diagnosis not present

## 2020-10-12 DIAGNOSIS — J441 Chronic obstructive pulmonary disease with (acute) exacerbation: Secondary | ICD-10-CM | POA: Diagnosis not present

## 2020-10-12 DIAGNOSIS — Z79899 Other long term (current) drug therapy: Secondary | ICD-10-CM | POA: Diagnosis not present

## 2020-10-12 DIAGNOSIS — Z7951 Long term (current) use of inhaled steroids: Secondary | ICD-10-CM | POA: Diagnosis not present

## 2020-10-12 DIAGNOSIS — Z7901 Long term (current) use of anticoagulants: Secondary | ICD-10-CM | POA: Diagnosis not present

## 2020-10-12 DIAGNOSIS — E785 Hyperlipidemia, unspecified: Secondary | ICD-10-CM | POA: Diagnosis present

## 2020-10-12 DIAGNOSIS — J439 Emphysema, unspecified: Secondary | ICD-10-CM | POA: Diagnosis present

## 2020-10-12 DIAGNOSIS — Z87891 Personal history of nicotine dependence: Secondary | ICD-10-CM | POA: Diagnosis not present

## 2020-10-12 LAB — BASIC METABOLIC PANEL
Anion gap: 6 (ref 5–15)
BUN: 21 mg/dL (ref 8–23)
CO2: 34 mmol/L — ABNORMAL HIGH (ref 22–32)
Calcium: 8.8 mg/dL — ABNORMAL LOW (ref 8.9–10.3)
Chloride: 98 mmol/L (ref 98–111)
Creatinine, Ser: 0.74 mg/dL (ref 0.61–1.24)
GFR, Estimated: >60 mL/min (ref >60)
Glucose, Bld: 99 mg/dL (ref 70–99)
Potassium: 3.6 mmol/L (ref 3.5–5.1)
Sodium: 138 mmol/L (ref 135–145)

## 2020-10-12 LAB — MAGNESIUM: Magnesium: 1.6 mg/dL — ABNORMAL LOW (ref 1.7–2.4)

## 2020-10-12 LAB — GLUCOSE, CAPILLARY: Glucose-Capillary: 165 mg/dL — ABNORMAL HIGH (ref 70–99)

## 2020-10-12 NOTE — BH Assessment (Signed)
13:48 TTS secure chatted Nurse for above patient , to inquire about placing the cart for TTS reassessment and Nurse advised that she was unable to do it right now. TTS will try again later.
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
## 2020-10-12 NOTE — TOC Progression Note (Signed)
Transition of Care [facility]) - Progression Note    Patient Details  Name: Cameron Ford MRN: [id number] Date of Birth: 03/13/1946  Transition of Care [facility]) CM/SW Contact  Barry Brunner, LCSW Phone Number: 10/12/2020, 4:29 PM  Clinical Narrative:    Patient continues to meet criteria for inpatient psych. CSW notified MD and [facility] that patient be provided respiradal injection. [facility] working to provide injection. TOC to follow.        Expected Discharge Plan and Services                                                 Social Determinants of Health (SDOH) Interventions    Readmission Risk Interventions No flowsheet data found.

## 2020-10-12 NOTE — Evaluation (Signed)
Physical Therapy Evaluation Patient Details Name: Cameron Ford MRN: [id number] DOB: 1946-03-10 Today's Date: 10/12/2020   History of Present Illness  Cameron Ford  is a 75 y.o. male, past medical history of hypertension, hyperlipidemia, dementia, COPD, CHF, asthma, patient was sent to ED via EMS by his SNF facility for psych evaluation, patient refuses Risperdal injection 2 weeks ago, and has not been acting himself since, and he has been refusing medications, as well he has been threatening to harm the staff, so they sent him to ED for evaluation, patient is very poor historian, will they report there is possible UTI, while in ED patient is more calm and cooperative.  - in ED there is no significant lab abnormalities, but patient was noted to be hypoxic 87% on room air, he had some wheezing when he received prednisone, he still requiring 2 L oxygen, his UA is positive started on Rocephin, given his hypoxia triage hospitalist consulted to admit.   Clinical Impression  Patient demonstrates labored movement for sitting up at bedside requiring HOB raised and use of bed rail, unsteady on feet having to lean on armrest of chair for support during transfers, and very kyphotic trunk when standing.  Patient tolerated sitting up in chair after therapy - RN notified.  Patient will benefit from continued physical therapy in hospital and recommended venue below to increase strength, balance, endurance for safe ADLs and gait.       Follow Up Recommendations SNF    Equipment Recommendations  None recommended by PT    Recommendations for Other Services       Precautions / Restrictions Precautions Precautions: Fall Restrictions Weight Bearing Restrictions: No      Mobility  Bed Mobility Overal bed mobility: Needs Assistance Bed Mobility: Supine to Sit     Supine to sit: Min guard;Supervision;HOB elevated     General bed mobility comments: increased time, labored movement     Transfers Overall transfer level: Needs assistance Equipment used: None;1 person hand held assist Transfers: Sit to/from [employer] Sit to Stand: Min guard Stand pivot transfers: Min assist       General transfer comment: increased time, labored movement  Ambulation/Gait Ambulation/Gait assistance: Mod assist Gait Distance (Feet): 3 Feet Assistive device: 1 person hand held assist Gait Pattern/deviations: Decreased step length - right;Decreased step length - left;Decreased stride length Gait velocity: decreased   General Gait Details: limited to 3-4 slow labored short steps at bedside during transfer to chair  Stairs            Wheelchair Mobility    Modified Rankin (Stroke Patients Only)       Balance Overall balance assessment: Needs assistance Sitting-balance support: Feet supported;No upper extremity supported Sitting balance-Leahy Scale: Fair Sitting balance - Comments: fair/good seated at EOB   Standing balance support: During functional activity;No upper extremity supported Standing balance-Leahy Scale: Poor Standing balance comment: has to lean on armrest of chair for support                             Pertinent Vitals/Pain Pain Assessment: No/denies pain    Home Living Family/patient expects to be discharged to:: Skilled nursing facility                      Prior Function Level of Independence: Needs assistance   Gait / Transfers Assistance Needed: Supervised transfers, uses wheelchair for mobility, has been non-ambulatory for greater  than 7 months  ADL's / Homemaking Assistance Needed: assisted by SNF staff        Hand Dominance   Dominant Hand: Right    Extremity/Trunk Assessment   Upper Extremity Assessment Upper Extremity Assessment: Generalized weakness;RUE deficits/detail;LUE deficits/detail RUE Deficits / Details: grossly 4+/5 RUE Sensation: WNL RUE Coordination: WNL LUE Deficits /  Details: grossly 3+/5 LUE Sensation: WNL LUE Coordination: WNL    Lower Extremity Assessment Lower Extremity Assessment: Generalized weakness    Cervical / Trunk Assessment Cervical / Trunk Assessment: Kyphotic  Communication   Communication: No difficulties  Cognition Arousal/Alertness: Awake/alert Behavior During Therapy: WFL for tasks assessed/performed Overall Cognitive Status: History of cognitive impairments - at baseline                                        General Comments      Exercises     Assessment/Plan    PT Assessment Patient needs continued PT services  PT Problem List Decreased strength;Decreased activity tolerance;Decreased balance;Decreased mobility       PT Treatment Interventions DME instruction;Functional mobility training;Therapeutic activities;Therapeutic exercise;Patient/family education;Gait training;Balance training    PT Goals (Current goals can be found in the Care Plan section)  Acute Rehab PT Goals Patient Stated Goal: return home PT Goal Formulation: With patient Time For Goal Achievement: 10/26/20 Potential to Achieve Goals: Good    Frequency Min 3X/week   Barriers to discharge        Co-evaluation               AM-PAC PT "6 Clicks" Mobility  Outcome Measure Help needed turning from your back to your side while in a flat bed without using bedrails?: None Help needed moving from lying on your back to sitting on the side of a flat bed without using bedrails?: A Little Help needed moving to and from a bed to a chair (including a wheelchair)?: A Lot Help needed standing up from a chair using your arms (e.g., wheelchair or bedside chair)?: A Little Help needed to walk in hospital room?: A Lot Help needed climbing 3-5 steps with a railing? : Total 6 Click Score: 15    End of Session   Activity Tolerance: Patient tolerated treatment well;Patient limited by fatigue Patient left: in chair;with call bell/phone  within reach;with chair alarm set Nurse Communication: Mobility status PT Visit Diagnosis: Unsteadiness on feet (R26.81);Other abnormalities of gait and mobility (R26.89);Muscle weakness (generalized) (M62.81)    Time: 3810-1751 PT Time Calculation (min) (ACUTE ONLY): 20 min   Charges:   PT Evaluation $PT Eval Moderate Complexity: 1 Mod PT Treatments $Therapeutic Activity: 8-22 mins        3:10 PM, 10/12/20 Ocie Bob, MPT Physical Therapist with [facility] [phone] office [phone] mobile phone

## 2020-10-12 NOTE — Progress Notes (Signed)
Patient under review at [facility].     Elayna Tobler, LCSW, LCAS Clincal Social Worker  [facility]  
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
## 2020-10-12 NOTE — Plan of Care (Signed)
  Problem: Acute Rehab PT Goals(only PT should resolve) Goal: Pt Will Go Supine/Side To Sit Outcome: Progressing Flowsheets (Taken 10/12/2020 1512) Pt will go Supine/Side to Sit: with modified independence Goal: Patient Will Transfer Sit To/From Stand Outcome: Progressing Flowsheets (Taken 10/12/2020 1512) Patient will transfer sit to/from stand:  with supervision  with min guard assist Goal: Pt Will Transfer Bed To Chair/Chair To Bed Outcome: Progressing Flowsheets (Taken 10/12/2020 1512) Pt will Transfer Bed to Chair/Chair to Bed:  with supervision  min guard assist Goal: Pt Will Ambulate Outcome: Progressing Flowsheets (Taken 10/12/2020 1512) Pt will Ambulate:  15 feet  with minimal assist  with moderate assist  with rolling walker   3:13 PM, 10/12/20 Ocie Bob, MPT Physical Therapist with [facility] [phone] office [phone] mobile phone

## 2020-10-12 NOTE — BH Assessment (Signed)
11:01 TTS attempted to reach cart to reassess no answer

## 2020-10-13 DIAGNOSIS — I10 Essential (primary) hypertension: Secondary | ICD-10-CM | POA: Diagnosis not present

## 2020-10-13 DIAGNOSIS — N390 Urinary tract infection, site not specified: Secondary | ICD-10-CM | POA: Diagnosis not present

## 2020-10-13 DIAGNOSIS — J441 Chronic obstructive pulmonary disease with (acute) exacerbation: Secondary | ICD-10-CM | POA: Diagnosis not present

## 2020-10-13 DIAGNOSIS — J9601 Acute respiratory failure with hypoxia: Secondary | ICD-10-CM | POA: Diagnosis not present

## 2020-10-13 MED ORDER — ZIPRASIDONE MESYLATE 20 MG IM SOLR
20.0000 mg | Freq: Two times a day (BID) | INTRAMUSCULAR | Status: DC | PRN
  Administered 2020-10-13: 20 mg via INTRAMUSCULAR
  Filled 2020-10-13 (×2): qty 20
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
## 2020-10-13 NOTE — BH Assessment (Signed)
TTS Reassessment:  Attempted TTS reassessment--per pts RN he was given Geodon and too sleepy to participate in reassessment at this time.  Weyman Pedro, MSW, LCSW Outpatient Therapist/Triage Specialist

## 2020-10-13 NOTE — Progress Notes (Signed)
[facility] is [facility] for [facility]i-[facility] placement.   Damita Dunnings, MSW, LCSW-A  9:51 AM 10/13/2020

## 2020-10-13 NOTE — TOC Progression Note (Signed)
Transition of Care [facility]) - Progression Note    Patient Details  Name: Cameron Ford MRN: [id number] Date of Birth: August 04, 1945  [facility] CM/SW Contact  Karn Cassis, [place] Phone Number: 10/13/2020, 10:48 AM  Clinical Narrative:   TOC notified TTS that pt is medically stable. Will continue to follow.          Expected Discharge Plan and Services                                                 Social Determinants of Health (SDOH) Interventions    Readmission Risk Interventions No flowsheet data found.
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
## 2020-10-14 DIAGNOSIS — I10 Essential (primary) hypertension: Secondary | ICD-10-CM | POA: Diagnosis not present

## 2020-10-14 DIAGNOSIS — J441 Chronic obstructive pulmonary disease with (acute) exacerbation: Secondary | ICD-10-CM | POA: Diagnosis not present

## 2020-10-14 LAB — T4, FREE: Free T4: 1.02 ng/dL (ref 0.61–1.12)

## 2020-10-14 LAB — FOLATE: Folate: 12.6 ng/mL (ref >5.9)

## 2020-10-14 LAB — TSH: TSH: 0.11 u[IU]/mL — ABNORMAL LOW (ref 0.350–4.500)

## 2020-10-14 LAB — VITAMIN B12: Vitamin B-12: 1127 pg/mL — ABNORMAL HIGH (ref 180–914)

## 2020-10-14 MED ORDER — NYSTATIN 100000 UNIT/GM EX POWD
Freq: Two times a day (BID) | CUTANEOUS | Status: DC
  Filled 2020-10-14 (×2): qty 15
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
## 2020-10-14 NOTE — Progress Notes (Signed)
CSW followed up with [facility] regarding possible placement. Per the intake RN there are no geri psych beds at this time but there are pending discharges for tomorrow. Patient is currently under review at [facility], MSW, LCSW-A  10:05 AM 10/14/2020
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
## 2020-10-14 NOTE — TOC Initial Note (Signed)
Transition of Care [facility]) - Initial/Assessment Note    Patient Details  Name: Cameron Ford MRN: [id number] Date of Birth: 10/28/1945  Transition of Care [facility]) CM/SW Contact:    Cameron Cassis, LCSW Phone Number: 10/14/2020, 3:35 PM  Clinical Narrative:  Pt brought to ED from [facility] for making threatening statements. He was admitted due to acute hypoxic respiratory failure/COPD exacerbation and UTI. TTS evaluated pt and recommended geropsych. LCSW left voicemail for pt's legal guardian, Cameron Ford with [facility] requesting return call. LCSW spoke with Cameron Ford at [facility] who reports they accepted pt from [facility] in January and he had been in a group home prior to that. LCSW also spoke with Cameron Ford, Associate Professor at [facility]. Cameron Ford reports at baseline, pt does pretty well at SNF outside of occasional outbursts. He was due to have risperidone injection on May 29, but refused. Since then, behaviors escalated and he was also provoking residents. Facility is willing for him to return if he is given risperidone injection and behaviors improve. TOC will continue to follow.              Expected Discharge Plan: Skilled Nursing Facility Barriers to Discharge: Psych Bed not available   Patient Goals and CMS Choice Patient states their goals for this hospitalization and ongoing recovery are:: pending psych recommendations      Expected Discharge Plan and Services Expected Discharge Plan: Skilled Nursing Facility In-house Referral: Clinical Social Work     Living arrangements for the past 2 months: Skilled Nursing Facility                 DME Arranged: N/A DME Agency: NA                  Prior Living Arrangements/Services Living arrangements for the past 2 months: Skilled Nursing Facility Lives with:: Facility Resident Patient language and need for interpreter reviewed:: Yes Do you feel safe going back to the place where you live?: Yes             Criminal Activity/Legal Involvement Pertinent to Current Situation/Hospitalization: No - Comment as needed  Activities of Daily Living Home Assistive Devices/Equipment: Wheelchair ADL Screening (condition at time of admission) Patient's cognitive ability adequate to safely complete daily activities?: No Is the patient deaf or have difficulty hearing?: Yes Does the patient have difficulty seeing, even when wearing glasses/contacts?: No Does the patient have difficulty concentrating, remembering, or making decisions?: Yes Patient able to express need for assistance with ADLs?: Yes Does the patient have difficulty dressing or bathing?: Yes Independently performs ADLs?: No Communication: Independent Dressing (OT): Needs assistance Is this a change from baseline?: Pre-admission baseline Grooming: Needs assistance Is this a change from baseline?: Pre-admission baseline Feeding: Needs assistance Is this a change from baseline?: Pre-admission baseline Bathing: Dependent Is this a change from baseline?: Pre-admission baseline Toileting: Needs assistance Is this a change from baseline?: Pre-admission baseline In/Out Bed: Needs assistance Is this a change from baseline?: Pre-admission baseline Walks in Home: Needs assistance (Uses wheelchair) Is this a change from baseline?: Pre-admission baseline Does the patient have difficulty walking or climbing stairs?: Yes Weakness of Legs: Both Weakness of Arms/Hands: Both  Permission Sought/Granted                  Emotional Assessment   Attitude/Demeanor/Rapport: Unable to Assess Affect (typically observed): Unable to Assess   Alcohol / Substance Use: Not Applicable Psych Involvement: Yes (comment)  Admission diagnosis:  Lower urinary tract infectious disease [N39.0] Hypoxia [R09.02] COPD exacerbation (HCC) [J44.1] Threatening behavior [R46.89] COPD with acute exacerbation (HCC) [J44.1] Patient Active Problem List    Diagnosis Date Noted   Psych & behavrl factors assoc w disord or dis classd elswhr--- psychosis 10/10/2020   COPD exacerbation (HCC) 10/09/2020   Hyponatremia 05/17/2020   Palliative care by specialist    Goals of care, counseling/discussion    Dementia without behavioral disturbance (HCC)    Hemorrhoids    COPD with acute exacerbation (HCC)    Acute respiratory failure with hypoxia (HCC)    Acute diastolic CHF (congestive heart failure) (HCC)    E. coli UTI    Weakness    Acute metabolic encephalopathy 05/03/2020   Acute pulmonary embolism (HCC) 05/03/2020   Acute cystitis 05/03/2020   AKI (acute kidney injury) (HCC) 05/03/2020   Pain due to onychomycosis of toenails of both feet 04/15/2019   SBO (small bowel obstruction) (HCC) 08/31/2018   Cellulitis 08/17/2016   Constipation 02/02/2016   HLD (hyperlipidemia) 02/02/2016   Essential hypertension 02/02/2016   Has a tremor 02/02/2016   GI bleed 03/12/2015   PCP:  Cameron Proffer, MD Pharmacy:   [facility] - [place], [place] - [address] [place] [address] Phone: [phone] Fax: [phone]     Social Determinants of Health (SDOH) Interventions    Readmission Risk Interventions Readmission Risk Prevention Plan 10/14/2020  Transportation Screening Complete  Home Care Screening Complete  Medication Review (RN CM) Complete  Some recent data might be hidden

## 2020-10-15 DIAGNOSIS — J441 Chronic obstructive pulmonary disease with (acute) exacerbation: Secondary | ICD-10-CM | POA: Diagnosis not present

## 2020-10-15 DIAGNOSIS — I10 Essential (primary) hypertension: Secondary | ICD-10-CM | POA: Diagnosis not present

## 2020-10-15 LAB — BASIC METABOLIC PANEL
Anion gap: 6 (ref 5–15)
BUN: 20 mg/dL (ref 8–23)
CO2: 33 mmol/L — ABNORMAL HIGH (ref 22–32)
Calcium: 9.2 mg/dL (ref 8.9–10.3)
Chloride: 100 mmol/L (ref 98–111)
Creatinine, Ser: 0.8 mg/dL (ref 0.61–1.24)
GFR, Estimated: >60 mL/min (ref >60)
Glucose, Bld: 110 mg/dL — ABNORMAL HIGH (ref 70–99)
Potassium: 4.2 mmol/L (ref 3.5–5.1)
Sodium: 139 mmol/L (ref 135–145)

## 2020-10-15 LAB — CBC
HCT: 37.3 % — ABNORMAL LOW (ref 39.0–52.0)
Hemoglobin: 12.1 g/dL — ABNORMAL LOW (ref 13.0–17.0)
MCH: 31.6 pg (ref 26.0–34.0)
MCHC: 32.4 g/dL (ref 30.0–36.0)
MCV: 97.4 fL (ref 80.0–100.0)
Platelets: 463 10*3/uL — ABNORMAL HIGH (ref 150–400)
RBC: 3.83 MIL/uL — ABNORMAL LOW (ref 4.22–5.81)
RDW: 14.9 % (ref 11.5–15.5)
WBC: 6.4 10*3/uL (ref 4.0–10.5)
nRBC: 0 % (ref 0.0–0.2)

## 2020-10-15 LAB — MAGNESIUM: Magnesium: 2 mg/dL (ref 1.7–2.4)

## 2020-10-15 MED ORDER — RISPERIDONE MICROSPHERES ER 25 MG IM SRER
25.0000 mg | INTRAMUSCULAR | Status: DC
  Administered 2020-10-15: 25 mg via INTRAMUSCULAR
  Filled 2020-10-15: qty 2

## 2020-10-15 MED ORDER — RISPERIDONE MICROSPHERES 25 MG IM SUSR: 25.0000 mg | INTRAMUSCULAR | Status: DC

## 2020-10-15 NOTE — Progress Notes (Signed)
Physical Therapy Treatment Patient Details Name: Cameron Ford MRN: [id number] DOB: 22-May-1945 Today's Date: 10/15/2020    History of Present Illness Cameron Ford  is a 75 y.o. male, past medical history of hypertension, hyperlipidemia, dementia, COPD, CHF, asthma, patient was sent to ED via EMS by his SNF facility for psych evaluation, patient refuses Risperdal injection 2 weeks ago, and has not been acting himself since, and he has been refusing medications, as well he has been threatening to harm the staff, so they sent him to ED for evaluation, patient is very poor historian, will they report there is possible UTI, while in ED patient is more calm and cooperative.  - in ED there is no significant lab abnormalities, but patient was noted to be hypoxic 87% on room air, he had some wheezing when he received prednisone, he still requiring 2 L oxygen, his UA is positive started on Rocephin, given his hypoxia triage hospitalist consulted to admit.    PT Comments    Patient demonstrates good return for bed/chair/wheelchair transfers leaning on armrest of chair and one handed hand held assist without loss of balance, limited to 6-7 steps due to fatigue and demonstrates good return for propelling self in wheelchair on level, inclined and declined surfaces - had to propel self backwards when going up ramp.  Patient tolerated sitting up in chair after therapy.  Patient will benefit from continued physical therapy in hospital and recommended venue below to increase strength, balance, endurance for safe ADLs and gait.     Follow Up Recommendations  SNF;Supervision for mobility/OOB;Supervision - Intermittent     Equipment Recommendations  None recommended by PT    Recommendations for Other Services       Precautions / Restrictions Precautions Precautions: Fall Restrictions Weight Bearing Restrictions: No    Mobility  Bed Mobility Overal bed mobility: Modified Independent              General bed mobility comments: required HOB raised    Transfers Overall transfer level: Needs assistance Equipment used: 1 person hand held assist Transfers: Sit to/from Stand;Stand Pivot Transfers Sit to Stand: Supervision;Min guard Stand pivot transfers: Min guard       General transfer comment: slightly labored movement, increased time, good return for maintaining standing balance with hand held assist  Ambulation/Gait Ambulation/Gait assistance: Min assist Gait Distance (Feet): 6 Feet Assistive device: 1 person hand held assist Gait Pattern/deviations: Decreased step length - right;Decreased step length - left;Decreased stride length Gait velocity: decreased   General Gait Details: limited to a few slow labored steps at bedside while leaning on armrest of chair and hand held Scientist, research (life sciences) mobility: Yes Wheelchair propulsion: Both lower extermities Wheelchair parts: Needs assistance Distance: 200 Wheelchair Assistance Details (indicate cue type and reason): good return for using legs to propel self forward and backwards on level and declined surfaces, requiried occasional Min guard assist when going up ramp (had propel self backwards going up ramp)  Modified Rankin (Stroke Patients Only)       Balance Overall balance assessment: Needs assistance Sitting-balance support: Feet supported;No upper extremity supported Sitting balance-Leahy Scale: Good Sitting balance - Comments: seated at EOB   Standing balance support: During functional activity;Single extremity supported Standing balance-Leahy Scale: Poor Standing balance comment: fair/poor with hand held assist or leaning on armrest of chair  Cognition Arousal/Alertness: Awake/alert Behavior During Therapy: WFL for tasks assessed/performed Overall Cognitive Status: History of cognitive impairments - at  baseline                                        Exercises General Exercises - Lower Extremity Long Arc Quad: Seated;AROM;Strengthening;Both;10 reps Hip ABduction/ADduction: Seated;AROM;Strengthening;Both;5 reps Hip Flexion/Marching: Seated;AROM;Strengthening;Both;10 reps    General Comments        Pertinent Vitals/Pain Pain Assessment: No/denies pain    Home Living                      Prior Function            PT Goals (current goals can now be found in the care plan section) Acute Rehab PT Goals Patient Stated Goal: return home PT Goal Formulation: With patient Time For Goal Achievement: 10/26/20 Potential to Achieve Goals: Good Progress towards PT goals: Progressing toward goals    Frequency    Min 3X/week      PT Plan Current plan remains appropriate    Co-evaluation              AM-PAC PT "6 Clicks" Mobility   Outcome Measure  Help needed turning from your back to your side while in a flat bed without using bedrails?: None Help needed moving from lying on your back to sitting on the side of a flat bed without using bedrails?: A Little Help needed moving to and from a bed to a chair (including a wheelchair)?: A Little Help needed standing up from a chair using your arms (e.g., wheelchair or bedside chair)?: A Little Help needed to walk in hospital room?: A Lot Help needed climbing 3-5 steps with a railing? : A Lot 6 Click Score: 17    End of Session   Activity Tolerance: Patient tolerated treatment well;Patient limited by fatigue Patient left: in chair;with call bell/phone within reach Nurse Communication: Mobility status PT Visit Diagnosis: Unsteadiness on feet (R26.81);Other abnormalities of gait and mobility (R26.89);Muscle weakness (generalized) (M62.81)     Time: 0174-9449 PT Time Calculation (min) (ACUTE ONLY): 27 min  Charges:  $Therapeutic Exercise: 8-22 mins $Therapeutic Activity: 8-22 mins                      {3:53 PM, 10/15/20 Ocie Bob, MPT Physical Therapist with [facility] 336 [phone] office [phone] mobile phone
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
## 2020-10-15 NOTE — Progress Notes (Signed)
Per the morning team meeting, it was determined by Dr. Lucianne Muss that the Pt will be reassessed and medication adjusted prior to the pt returning to his SNF. Additional positions are on hold due to the possibility of him returning to his SNF.    Damita Dunnings, MSW, LCSW-A  3:00 PM 10/15/2020

## 2020-10-15 NOTE — Consult Note (Addendum)
Risperdal Consta  IM given at 1546 today.  Patient irritable with this provider prior to injection. Recommend psychiatric reassessment 10/16/20 to see if patient can be psyche cleared. TOC consult to be placed for re entry to SNF. CSW confirmed earlier this week that if he takes the injection which he received today, and his behavior improves he can return to facility he came from. Communication with Dr. Arbutus Leas and RN concerning this information.

## 2020-10-16 DIAGNOSIS — N390 Urinary tract infection, site not specified: Secondary | ICD-10-CM | POA: Diagnosis present

## 2020-10-16 DIAGNOSIS — I10 Essential (primary) hypertension: Secondary | ICD-10-CM | POA: Diagnosis not present

## 2020-10-16 DIAGNOSIS — F03918 Unspecified dementia, unspecified severity, with other behavioral disturbance: Secondary | ICD-10-CM | POA: Diagnosis present

## 2020-10-16 DIAGNOSIS — J441 Chronic obstructive pulmonary disease with (acute) exacerbation: Secondary | ICD-10-CM | POA: Diagnosis not present

## 2020-10-16 LAB — RESP PANEL BY RT-PCR (FLU A&B, COVID) ARPGX2
Influenza A by PCR: NEGATIVE
Influenza B by PCR: NEGATIVE
SARS Coronavirus 2 by RT PCR: NEGATIVE
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
## 2020-10-16 NOTE — Progress Notes (Signed)
CSW received notice that patient is being prepared to be psych cleared. All corresponding parties will be notified.    Damita Dunnings, MSW, LCSW-A  2:09 PM 10/16/2020
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
## 2020-10-17 DIAGNOSIS — J441 Chronic obstructive pulmonary disease with (acute) exacerbation: Secondary | ICD-10-CM | POA: Diagnosis not present

## 2020-10-17 DIAGNOSIS — F54 Psychological and behavioral factors associated with disorders or diseases classified elsewhere: Secondary | ICD-10-CM

## 2020-10-17 DIAGNOSIS — F0391 Unspecified dementia with behavioral disturbance: Secondary | ICD-10-CM | POA: Diagnosis not present
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
## 2020-10-17 NOTE — TOC Progression Note (Signed)
Transition of Care [facility]) - Progression Note    Patient Details  Name: Cameron Ford MRN: [id number] Date of Birth: [birth year]  Transition of Care [facility]) CM/SW Contact  Barry Brunner, LCSW Phone Number: 10/17/2020, 10:16 AM  Clinical Narrative:    Patient has been psych cleared. Patient has also received Risperdal injection. Revonda Standard with [facility] agreeable to take patient back on 10/16/2020. [facility] reported that they will not be transporting patients from third floor or ICU due to staffing. [facility] reported that they will not be able to take patient over weekend if alternative transportation is found and that patient would be required to admit Monday. TOC to follow.   Expected Discharge Plan: Skilled Nursing Facility Barriers to Discharge: Continued Medical Work up  Expected Discharge Plan and Services Expected Discharge Plan: Skilled Nursing Facility In-house Referral: Clinical Social Work     Living arrangements for the past 2 months: Skilled Nursing Facility Expected Discharge Date: 10/16/20               DME Arranged: N/A DME Agency: NA                   Social Determinants of Health (SDOH) Interventions    Readmission Risk Interventions Readmission Risk Prevention Plan 10/14/2020  Transportation Screening Complete  Home Care Screening Complete  Medication Review (RN CM) Complete  Some recent data might be hidden

## 2020-10-18 DIAGNOSIS — F0391 Unspecified dementia with behavioral disturbance: Secondary | ICD-10-CM | POA: Diagnosis not present

## 2020-10-18 DIAGNOSIS — J441 Chronic obstructive pulmonary disease with (acute) exacerbation: Secondary | ICD-10-CM | POA: Diagnosis not present

## 2020-10-18 LAB — RESP PANEL BY RT-PCR (FLU A&B, COVID) ARPGX2
Influenza A by PCR: NEGATIVE
Influenza B by PCR: NEGATIVE
SARS Coronavirus 2 by RT PCR: NEGATIVE

## 2020-10-18 NOTE — Progress Notes (Signed)
I walked into the room and asked patient if he was ready for his night time medication.  He said that he was.  As I was scanning his medications, he stated, "I can tell if someone likes me based on their ton of voice". I agreed and then he asked if I was giving him medication to help him sleep.  I told him what I was giving him and said some of them could cause sedation. He replied, "I need about 7 of them". I explained that wasn't safe.  He then asked if he had anything for pain.  I stated there was nothing ordered, but I could call and ask for something.  He then stated he wanted "demerol because Tylenol doesn't help".  Explained to patient that I would have to contact the doctor and ask for something, but he had no PIV for me to give him.  He stated, "I haven't seen a doctor. All I've seen all day are a bunch of dumbasses." He had become increasingly agitated and loud.   This morning I went to give him his risperidone and as soon as I entered the room, he told me to get out.  I did as requested by patient.
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
## 2020-10-19 DIAGNOSIS — J9601 Acute respiratory failure with hypoxia: Secondary | ICD-10-CM | POA: Diagnosis not present

## 2020-10-19 DIAGNOSIS — J441 Chronic obstructive pulmonary disease with (acute) exacerbation: Secondary | ICD-10-CM | POA: Diagnosis not present

## 2020-10-19 DIAGNOSIS — F0391 Unspecified dementia with behavioral disturbance: Secondary | ICD-10-CM | POA: Diagnosis not present

## 2020-10-19 NOTE — TOC Transition Note (Signed)
Transition of Care [facility]) - CM/SW Discharge Note   Patient Details  Name: Cameron Ford MRN: [id number] Date of Birth: 1946/01/01  Transition of Care [facility]) CM/SW Contact:  Barry Brunner, LCSW Phone Number: 10/19/2020, 9:56 AM   Clinical Narrative:    [facility] agreeable to take patient on 10/19/2020 with updated d/c summary and Covid test. D/C summary updated and Negative Covid test resulted. CSW contacted EMS. EMS agreeable to take patient. Nurse reported that report has been called. TOC signing off.      Barriers to Discharge: Continued Medical Work up   Patient Goals and CMS Choice Patient states their goals for this hospitalization and ongoing recovery are:: pending psych recommendations      Discharge Placement                       Discharge Plan and Services In-house Referral: Clinical Social Work              DME Arranged: N/A DME Agency: NA                  Social Determinants of Health (SDOH) Interventions     Readmission Risk Interventions Readmission Risk Prevention Plan 10/14/2020  Transportation Screening Complete  Home Care Screening Complete  Medication Review (RN CM) Complete  Some recent data might be hidden
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
## 2020-11-23 ENCOUNTER — Non-Acute Institutional Stay: Payer: Medicare Other | Admitting: Primary Care

## 2020-11-23 ENCOUNTER — Other Ambulatory Visit: Payer: Self-pay

## 2020-11-23 DIAGNOSIS — Z515 Encounter for palliative care: Secondary | ICD-10-CM

## 2020-11-23 DIAGNOSIS — F54 Psychological and behavioral factors associated with disorders or diseases classified elsewhere: Secondary | ICD-10-CM

## 2020-11-23 DIAGNOSIS — G8921 Chronic pain due to trauma: Secondary | ICD-10-CM | POA: Insufficient documentation

## 2020-11-23 DIAGNOSIS — F0390 Unspecified dementia without behavioral disturbance: Secondary | ICD-10-CM

## 2020-11-23 DIAGNOSIS — J441 Chronic obstructive pulmonary disease with (acute) exacerbation: Secondary | ICD-10-CM
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
## 2021-03-15 ENCOUNTER — Non-Acute Institutional Stay: Payer: Medicare Other | Admitting: Primary Care

## 2021-03-15 ENCOUNTER — Other Ambulatory Visit: Payer: Self-pay

## 2021-03-15 DIAGNOSIS — G8921 Chronic pain due to trauma: Secondary | ICD-10-CM

## 2021-03-15 DIAGNOSIS — Z515 Encounter for palliative care: Secondary | ICD-10-CM

## 2021-03-15 DIAGNOSIS — F03918 Unspecified dementia, unspecified severity, with other behavioral disturbance: Secondary | ICD-10-CM

## 2021-03-15 NOTE — Progress Notes (Signed)
[place] Consult Note Telephone: [phone]  Fax: [phone]    Date of encounter: 03/15/21 12:22 PM PATIENT NAME: Cameron Ford [id number]   [phone] (home)  DOB: December 17, 1945 MRN: [id number] PRIMARY CARE PROVIDER:    Bonnita Nasuti, MD,  [address] [place] [address] [phone]  REFERRING PROVIDER:   Bonnita Nasuti, MD [address] [place],  [place] [address] [phone]  RESPONSIBLE PARTY:    Contact Information     Name Relation Home Work [place], [place] [phone]   [place] Other [place] Other [phone]     [facility] Sister [phone]     Sim Boast Other   [phone]      I met face to face with patient in [facility] facility. Palliative Care was asked to follow this patient by consultation request of  Hague, Rosalyn Charters, MD to address advance care planning and complex medical decision making. This is a follow up visit.                                   ASSESSMENT AND PLAN / RECOMMENDATIONS:   Advance Care Planning/Goals of Care: Goals include to maximize quality of life and symptom management.  CODE STATUS: FULL DSS IS POA  I completed a MOST form today. The patient and family outlined their wishes for the following treatment decisions:  Cardiopulmonary Resuscitation: Attempt Resuscitation (CPR)  Medical Interventions: Full Scope of Treatment: Use intubation, advanced airway interventions, mechanical ventilation, cardioversion as indicated, medical treatment, IV fluids, etc, also provide comfort measures. Transfer to the hospital if indicated  Antibiotics: Antibiotics if indicated  IV Fluids: IV fluids if indicated  Feeding Tube: Feeding tube long-term if indicated     Symptom Management/Plan:  Patient is in w/c today, getting ready for  lunch. He endorses R leg pain, esp at night. He states he is only taking tylenol but he has tramadol ordered each hs.  He states this is insufficient, however, he did relate that he can get comfortable eventually and be repositioned for comfort. Recommend tylenol ATC if not taking.  Follow up Palliative Care Visit: Palliative care will continue to follow for complex medical decision making, advance care planning, and clarification of goals. Return 8 weeks or prn.  I spent 15 minutes providing this consultation. More than 50% of the time in this consultation was spent in counseling and care coordination.  PPS: 40%  HOSPICE ELIGIBILITY/DIAGNOSIS: TBD  Chief Complaint: R leg pain  HISTORY OF PRESENT ILLNESS:  Cameron Ford is a 75 y.o. year old male  with chronic pain, COPD, dementia .   History obtained from review of EMR, discussion with primary team, and interview with family, facility staff/caregiver and/or Cameron Ford.  I reviewed available labs, medications, imaging, studies and related documents from the EMR.  Records reviewed and summarized above.   ROS  General: NAD ENMT: denies dysphagiaE Pulmonary: denies cough, denies increased SOB Abdomen: endorses good appetite, denies constipation MSK:  denies weakness,  no falls reported Skin: denies rashes or wounds Neurological: endorses hs pain, denies insomnia Psych: Endorses positive mood Heme/lymph/immuno: denies bruises, abnormal bleeding  Physical Exam: Current and past weights:unavailable Constitutional: NAD General: frail appearing EYES: anicteric sclera, lids intact, no discharge  ENMT: intact hearing, oral mucous membranes moist Pulmonary:  no increased work of breathing, no cough, room air Abdomen: intake 100%,no ascites GU: deferred MSK: + sarcopenia, moves all extremities, non ambulatory Skin: warm and dry, no rashes or wounds on visible skin Neuro:  + generalized weakness,  severe  cognitive impairment Psych:  anxious affect, A and O x 3=2 Hem/lymph/immuno: no widespread bruising   Thank you for the opportunity to participate in the care of Cameron Ford.  The palliative care team will continue to follow. Please call our office at [phone] if we can be of additional assistance.   Jason Coop, NP DNP, AGPCNP-BC  COVID-19 PATIENT SCREENING TOOL Asked and negative response unless otherwise noted:   Have you had symptoms of covid, tested positive or been in contact with someone with symptoms/positive test in the past 5-10 days?

## 2021-06-09 ENCOUNTER — Other Ambulatory Visit: Payer: Self-pay

## 2021-06-09 ENCOUNTER — Non-Acute Institutional Stay: Payer: Medicare Other | Admitting: Primary Care

## 2021-06-09 DIAGNOSIS — G8921 Chronic pain due to trauma: Secondary | ICD-10-CM

## 2021-06-09 DIAGNOSIS — F03918 Unspecified dementia, unspecified severity, with other behavioral disturbance: Secondary | ICD-10-CM

## 2021-06-09 DIAGNOSIS — Z515 Encounter for palliative care: Secondary | ICD-10-CM
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
## 2021-08-18 ENCOUNTER — Non-Acute Institutional Stay: Payer: Medicare Other | Admitting: Primary Care

## 2021-08-19 ENCOUNTER — Non-Acute Institutional Stay: Payer: Medicare Other | Admitting: Primary Care

## 2021-08-19 DIAGNOSIS — G8921 Chronic pain due to trauma: Secondary | ICD-10-CM

## 2021-08-19 DIAGNOSIS — Z515 Encounter for palliative care: Secondary | ICD-10-CM

## 2021-08-19 DIAGNOSIS — F03918 Unspecified dementia, unspecified severity, with other behavioral disturbance: Secondary | ICD-10-CM

## 2021-08-19 DIAGNOSIS — J441 Chronic obstructive pulmonary disease with (acute) exacerbation: Secondary | ICD-10-CM

## 2021-08-19 NOTE — Progress Notes (Signed)
? ? ?[facility] ?[facility] Palliative [facility] Consult Note ?Telephone: [phone]  ?Fax: [phone]  ? ? ?Date of encounter: 08/19/21 ?11:55 AM ?PATIENT NAME: Mellissa Kohut ?[facility] ?[address] ?[place] [address]   ?[phone] (home)  ?DOB: [birth year] ?MRN: [id number] ?PRIMARY CARE PROVIDER:    ?Bonnita Nasuti, MD,  ?[address] ?[place] [address] ?[phone] ? ?REFERRING PROVIDER:   ?Bonnita Nasuti, MD ?[address] ?[place],  [place] [address] ?[phone] ? ?RESPONSIBLE PARTY:    ?Contact Information   ? ? Name Relation Home Work Mobile  ? [place], [place] [phone]  ? [place] Other [phone]    ? [place] Other [phone]    ? [place] Sister [phone]    ? [place], [place] Other   [phone]  ? ?  ? ? ? ?I met face to face with patient [facility] facility. Palliative Care was asked to follow this patient by consultation request of  Hague, Rosalyn Charters, MD to address advance care planning and complex medical decision making. This is a follow up visit. ? ?                                 ASSESSMENT AND PLAN / RECOMMENDATIONS:  ? ?Advance Care Planning/Goals of Care: Goals include to maximize quality of life and symptom management.  ?Identification of a healthcare agent - DSS ? ?CODE STATUS: Full code ? ?Symptom Management/Plan: ? ?I visited Mr. Schwabe and his nursing home room. Today he was pleasant and interactive. He states he enjoys going outside to smoke. He also states that he enjoys his roommate and that last night they played checkers till late and ate a huge bag of potatoes between them. This is the most animated I have seen him. He does continue to endorse neck pain this is a chronic nature. I consulted with nursing home staff who state his Tylenol  is PRN. I will send orders for this to be 1 g of acetaminophen every eight hours scheduled rather than PRN. He also continues to ask for a neck  brace I will send an order for a soft neck brace to be provided. He has had this request in the past.  ? ?Follow up Palliative Care Visit: Palliative care will continue to follow for complex medical decision making, advance care planning, and clarification of goals. Return 6-8  weeks or prn. ? ?I spent 25 minutes providing this consultation. More than 50% of the time in this consultation was spent in counseling and care coordination. ? ?PPS: 40% ? ?HOSPICE ELIGIBILITY/DIAGNOSIS: TBD ? ?Chief Complaint: chronic pain ? ?HISTORY OF PRESENT ILLNESS:  SALIM FORERO is a 76 y.o. year old male  with chronic pain, behavior disturbances and dementia, immobility . Patient seen today to review palliative care needs to include medical decision making and advance care planning as appropriate.  ? ?History obtained from review of EMR, discussion with primary team, and interview with family, facility staff/caregiver and/or Mr. Kimberlin.  ?I reviewed available labs, medications, imaging, studies and related documents from the EMR.  Records reviewed and summarized above.  ? ?General: NAD ?ENMT: denies dysphagia ?Pulmonary: denies cough, denies increased SOB, endorses smoking ?Abdomen: endorses good appetite, denies constipation, endorses continence of bowel ?GU: denies dysuria, endorses incontinence of urine ?MSK:  denies  increased weakness,  no falls reported ?Skin: denies rashes or wounds ?Neurological: endorses chronic   neck  pain, denies insomnia ?Psych: Endorses positive mood ?Heme/lymph/immuno: denies bruises, abnormal bleeding ?  ?Physical Exam: ?Current and past weights: stable ?Constitutional: NAD ?General: frail appearing, wnwd ?EYES: anicteric sclera, lids intact, no discharge  ?ENMT: intact hearing, oral mucous membranes moist ?Pulmonary: no increased work of breathing, no cough, room air ?Abdomen: intake 80%, no ascites ?GU: deferred ?MSK: mild sarcopenia, moves all extremities,  non ambulatory, in w/c ?Skin: warm and  dry, no rashes or wounds on visible skin ?Neuro:  baseline  generalized weakness,  mod to severe cognitive impairment ?Psych:  non anxious affect, A and O x 2 ?Hem/lymph/immuno: no widespread bruising ? ? ?Thank you for the opportunity to participate in the care of Mr. Frizell.  The palliative care team will continue to follow. Please call our office at [phone] if we can be of additional assistance.  ? ?Jason Coop, NP DNP, AGPCNP-BC ? ?COVID-19 PATIENT SCREENING TOOL ?Asked and negative response unless otherwise noted:  ? ?Have you had symptoms of covid, tested positive or been in contact with someone with symptoms/positive test in the past 5-10 days?  ? ?
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
## 2022-01-19 ENCOUNTER — Non-Acute Institutional Stay: Payer: Medicare Other | Admitting: Primary Care

## 2022-01-19 DIAGNOSIS — G8921 Chronic pain due to trauma: Secondary | ICD-10-CM

## 2022-01-19 DIAGNOSIS — J441 Chronic obstructive pulmonary disease with (acute) exacerbation: Secondary | ICD-10-CM

## 2022-01-19 DIAGNOSIS — Z515 Encounter for palliative care: Secondary | ICD-10-CM

## 2022-01-19 DIAGNOSIS — F54 Psychological and behavioral factors associated with disorders or diseases classified elsewhere: Secondary | ICD-10-CM

## 2022-01-19 NOTE — Progress Notes (Signed)
[facility] Consult Note Telephone: [phone]  Fax: [phone]    Date of encounter: 01/19/22 12:08 PM PATIENT NAME: Cameron Ford [id number]   [phone] (home)  DOB: [birth year] MRN: [id number] PRIMARY CARE PROVIDER:    Bonnita Nasuti, MD,  [address] [place] [address] [phone]  REFERRING PROVIDER:   Bonnita Nasuti, MD [address] [place],  [place] [address] [phone]  RESPONSIBLE PARTY:    Contact Information     Name Relation Home Work [place], [place] [phone]   [place] Other [place] Other [phone]     [facility] Sister [phone]     Sim Boast Other   [phone]        I met face to face with patient in [facility] facility. Palliative Care was asked to follow this patient by consultation request of  Hague, Rosalyn Charters, MD to address advance care planning and complex medical decision making. This is a follow up visit.                                   ASSESSMENT AND PLAN / RECOMMENDATIONS:   Advance Care Planning/Goals of Care: Goals include to maximize quality of life and symptom management. Patient/health care surrogate gave his/her permission to discuss.Our advance care planning conversation included a discussion about:     Exploration of personal, cultural or spiritual beliefs that might influence medical decisions   Identification of a healthcare agent - legal guardian Review and updating or creation of an  advance directive document . Decision not to resuscitate or to de-escalate disease focused treatments due to poor prognosis. CODE STATUS: Full scope  Symptom Management/Plan:  I met with patient in his nursing home room. He was alert and pleasant. 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 I've requested to see ordered and will submit an order if needed.He endorses weight gain from good appetite and continued smoking. He was in a pleasant and upbeat mood today.   Follow up Palliative Care Visit: Palliative care will continue to follow for complex medical decision making, advance care planning, and clarification of goals. Return 8-12 weeks or prn.  I spent 15 minutes providing this consultation. More than 50% of the time in this consultation was spent in counseling and care coordination.  PPS: 40%  HOSPICE ELIGIBILITY/DIAGNOSIS: TBD  Chief Complaint: dementia, immobility  HISTORY OF PRESENT ILLNESS:  Cameron Ford is a 76 y.o. year old male  with debility, dementia, weight gain . Patient seen today to review palliative care needs to include medical decision making and advance care planning as appropriate.   History obtained from review of EMR, discussion with primary team, and interview with family, facility staff/caregiver and/or Cameron Ford.  I reviewed available labs, medications, imaging, studies and related documents from the EMR.  Records reviewed and summarized above.   ROS   General: NAD ENMT: denies dysphagia Cardiovascular: denies chest pain, denies DOE Pulmonary: denies cough, denies increased SOB Abdomen: endorses good appetite, denies constipation, endorses incontinence of bowel GU: denies dysuria, endorses incontinence of urine MSK:  endorses increased weakness,  1 fall  falls reported  Skin: denies rashes or wounds Neurological: denies pain, denies insomnia Psych: Endorses positive mood  Physical Exam: Current and past weights: 205 lbs Constitutional: NAD General: frail appearing EYES: anicteric sclera, lids intact, no discharge  ENMT: intact hearing, oral mucous membranes moist, dentition missing Pulmonary: no increased work of breathing, no cough,  room air Abdomen: intake 90%, soft and non tender, no ascites MSK:+ sarcopenia, moves all extremities,  non ambulatory Skin: warm and dry, no rashes or wounds on visible skin Neuro:  + generalized weakness,  + cognitive impairment, non-anxious affect   Thank you for the opportunity to participate in the care of Cameron Ford.  The palliative care team will continue to follow. Please call our office at [phone] if we can be of additional assistance.   Cameron Coop, NP DNP, AGPCNP-BC  COVID-19 PATIENT SCREENING TOOL Asked and negative response unless otherwise noted:   Have you had symptoms of covid, tested positive or been in contact with someone with symptoms/positive test in the past 5-10 days?

## 2022-03-16 ENCOUNTER — Non-Acute Institutional Stay: Payer: Medicare Other | Admitting: Primary Care

## 2022-03-16 DIAGNOSIS — G8921 Chronic pain due to trauma: Secondary | ICD-10-CM

## 2022-03-16 DIAGNOSIS — F54 Psychological and behavioral factors associated with disorders or diseases classified elsewhere: Secondary | ICD-10-CM

## 2022-03-16 DIAGNOSIS — Z515 Encounter for palliative care: Secondary | ICD-10-CM

## 2022-03-16 NOTE — Progress Notes (Signed)
[facility] Consult Note Telephone: [phone]  Fax: [phone]    Date of encounter: 03/16/22 11:44 AM PATIENT NAME: Cameron Ford [id number]   [phone] (home)  DOB: [birth year] MRN: [id number] PRIMARY CARE PROVIDER:    Bonnita Nasuti, MD,  [address] [place] [address] [phone]  REFERRING PROVIDER:   Bonnita Nasuti, MD [address] [place],  [place] [address] [phone]  RESPONSIBLE PARTY:    Contact Information     Name Relation Home Work [place], [place] [phone]   [place] Other [place] Other [phone]     [facility] Sister [phone]     Sim Boast Other   [phone]        I met face to face with patient in [facility] care facility. Palliative Care was asked to follow this patient by consultation request of  Hague, Rosalyn Charters, MD to address advance care planning and complex medical decision making. This is a follow up visit.                                   ASSESSMENT AND PLAN / RECOMMENDATIONS:   Advance Care Planning/Goals of Care: Goals include to maximize quality of life and symptom management.  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 May benefit from prn muscle relaxer if not contraindicated by his current drug regimen. HE endorses great pain. I would also recommend an orthopedic consultation RE more ongoing pain  management.  Mood: Variable. Recommend PRN haldol 5 mg po daily for additional agitation.  Mobility: In w/c, able to mobilize in SNF. Goes outdoors to smoke.  Follow up Palliative Care Visit: Palliative care will continue to follow for complex medical decision making, advance care planning, and clarification of goals. Return 8 weeks or prn.  This visit was coded based on medical decision making (MDM).  PPS: 40%  HOSPICE ELIGIBILITY/DIAGNOSIS: TBD  Chief Complaint: chronic pain  HISTORY OF PRESENT ILLNESS:  Cameron Ford is a 76 y.o. year old male  with agitation, dementia, chronic pain .   History obtained from review of EMR, discussion with primary team, and interview with family, facility staff/caregiver and/or Cameron Ford.  I reviewed available labs, medications, imaging, studies and related documents from the EMR.  Records reviewed and summarized above.   ROS   General: NAD ENMT: denies dysphagia Pulmonary: denies cough, denies increased SOB Abdomen: endorses good appetite, denies constipation, endorses incontinence of bowel GU: denies dysuria, endorses incontinence of urine MSK:  denies increased weakness,  no falls reported Skin: denies rashes or wounds Neurological: endorses severe neck pain, denies insomnia Psych: Endorses agitated  mood Heme/lymph/immuno: denies bruises, abnormal bleeding  Physical Exam: Current and past weights: 204.7 lbs Constitutional: NAD General: frail appearing, obese  EYES: anicteric sclera, lids intact, no discharge  ENMT: intact hearing, oral mucous membranes moist Pulmonary:= no increased work  of breathing, no cough, room air Abdomen: intake 100%,  soft and non tender, no ascites GU: deferred MSK: + sarcopenia, moves all extremities,  non ambulatory Skin: warm and dry, no rashes or wounds on visible skin Neuro:  + generalized weakness, + cognitive impairment, + tremors Psych: anxious affect, A and O x 2 Hem/lymph/immuno: no  widespread bruising   Thank you for the opportunity to participate in the care of Cameron Ford. Please call our office at [phone] if we can be of additional assistance.   Jason Coop, NP   COVID-19 PATIENT SCREENING TOOL Asked and negative response unless otherwise noted:   Have you had symptoms of covid, tested positive or been in contact with someone with symptoms/positive test in the past 5-10 days?

## 2022-03-30 ENCOUNTER — Non-Acute Institutional Stay: Payer: Medicare Other | Admitting: Nurse Practitioner

## 2022-03-30 ENCOUNTER — Encounter: Payer: Self-pay | Admitting: Nurse Practitioner

## 2022-03-30 DIAGNOSIS — Z515 Encounter for palliative care: Secondary | ICD-10-CM

## 2022-03-30 DIAGNOSIS — G8921 Chronic pain due to trauma: Secondary | ICD-10-CM

## 2022-03-30 DIAGNOSIS — R5381 Other malaise: Secondary | ICD-10-CM

## 2022-03-30 NOTE — Progress Notes (Signed)
    [facility] Consult Note Telephone: [phone]  Fax: [phone]    Date of encounter: 03/30/22 10:19 PM PATIENT NAME: Cameron Ford [id number]   [phone] (home)  DOB: [birth year] MRN: [id number] PRIMARY CARE PROVIDER:    [facility]  RESPONSIBLE PARTY:    Contact Information     Name Relation Home Work [place], [place] [phone]   [place] Other [place] Other [phone]     [place] Single Sister [phone]     Sim Boast Other   [phone]      I met face to face with patient in facility. Palliative Care was asked to follow this patient by consultation request of [facility] to address advance care planning and complex medical decision making. This is a follow up visit.                                  ASSESSMENT AND PLAN / RECOMMENDATIONS:  Symptom Management/Plan: 1. Advance Care Planning;    2. Goals of Care: Goals include to maximize quality of life and symptom management. Our advance care planning conversation included a discussion about:    The value and importance of advance care planning  Exploration of personal, cultural or spiritual beliefs that might influence medical decisions  Exploration of goals of care in the event of a sudden injury or illness  Identification and preparation of a healthcare agent  Review and updating or creation of an advance directive document.  3. Chronic pain  4. Debility   5. Palliative care encounter; Palliative care encounter; Palliative medicine team will continue to support patient, patient's family, and medical team. Visit consisted of counseling and education dealing with the complex and emotionally intense issues of symptom management and palliative care in the setting of serious and potentially life-threatening  illness   Follow up Palliative Care Visit: Palliative care will continue to follow for complex medical decision making, advance care planning, and clarification of goals. Return 4 to 6 weeks or prn.  I spent 44 minutes providing this consultation. More than 50% of the time in this consultation was spent in counseling and care coordination. PPS: 40%  Chief Complaint: Follow up palliative consult for complex medical decision making, address goals, manage ongoing symptoms  HISTORY OF PRESENT ILLNESS:  Cameron Ford is a 76 y.o. year old male  with multiple medical problems including Dementia, chronic pain  History obtained from review of EMR, discussion with primary team, and interview with family, facility staff/caregiver and/or Cameron Ford.  I reviewed available labs, medications, imaging, studies and related documents from the EMR.  Records reviewed and summarized above.   ROS 10 point system reviewed all negative except HPI  Physical Exam: Constitutional: NAD General: obese, male, debilitated, intermit irritated EYES: ids intact ENMT: intact hearing, oral mucous membranes moist Skin: warm and dry Neuro:  no generalized weakness,  + cognitive impairment Psych: +irritable at times,  Alert, oriented to self, place Thank you for the opportunity to participate in the care of Cameron Ford. Please call our office at [phone] if we can be of additional assistance.   Deberah Adolf Ihor Gully, NP

## 2022-05-17 ENCOUNTER — Encounter: Payer: Self-pay | Admitting: Nurse Practitioner

## 2022-05-17 ENCOUNTER — Non-Acute Institutional Stay: Payer: 59 | Admitting: Nurse Practitioner

## 2022-05-17 DIAGNOSIS — F03918 Unspecified dementia, unspecified severity, with other behavioral disturbance: Secondary | ICD-10-CM

## 2022-05-17 DIAGNOSIS — G8921 Chronic pain due to trauma: Secondary | ICD-10-CM

## 2022-05-17 DIAGNOSIS — R5381 Other malaise: Secondary | ICD-10-CM

## 2022-05-17 DIAGNOSIS — Z515 Encounter for palliative care: Secondary | ICD-10-CM

## 2022-05-17 NOTE — Progress Notes (Addendum)
[facility] Consult Note Telephone: [phone]  Fax: [phone]    Date of encounter: 05/17/22 4:50 PM PATIENT NAME: Cameron Ford [place] [id number]   [phone] (home)  DOB: [birth year] MRN: [id number] PRIMARY CARE PROVIDER:    [place]:    Contact Information     Name Relation Home Work [place], [place] [phone]   [place] Other [place] Other [phone]     [place] Single Sister [phone]     Sim Boast Other   [phone]     I met face to face with patient in facility. Palliative Care was asked to follow this patient by consultation request of [facility] to address advance care planning and complex medical decision making. This is a follow up visit.                                  ASSESSMENT AND PLAN / RECOMMENDATIONS:  Symptom Management/Plan: 1. Advance Care Planning;     2. Goals of Care: Goals include to maximize quality of life and symptom management. Our advance care planning conversation included a discussion about:    The value and importance of advance care planning  Exploration of personal, cultural or spiritual beliefs that might influence medical decisions  Exploration of goals of care in the event of a sudden injury or illness  Identification and preparation of a healthcare agent  Review and updating or creation of an advance directive document.   3. Chronic pain ongoing, discussed at length with Cameron Ford    4. Dementia with Debility ongoing, w/c dependent, continue to encourage mobility, oob, self independence. Fall precautions. Encourage Cameron Virden to feed himself. Discussed importance with Cameron Zurn, continue to monitor, positive reinforcements, supportive role 03/25/2022 weight 203.9 lbs 05/02/2022 weight 204.1 lbs 5. Palliative  care encounter; Palliative care encounter; Palliative medicine team will continue to support patient, patient's family, and medical team. Visit consisted of counseling and education dealing with the complex and emotionally intense issues of symptom management and palliative care in the setting of serious and potentially life-threatening illness   Follow up Palliative Care Visit: Palliative care will continue to follow for complex medical decision making, advance care planning, and clarification of goals. Return 4 to 8 weeks or prn.   I spent 46 minutes providing this consultation. More than 50% of the time in this consultation was spent in counseling and care coordination. PPS: 50%   Chief Complaint: Follow up palliative consult for complex medical decision making, address goals, manage ongoing symptoms   HISTORY OF PRESENT ILLNESS:  Cameron Ford is a 77 y.o. year old male  with multiple medical problems including Dementia with behaviors, diastolic CHF, h/o PE, COPD, h/o respiratory failure with hypoxia, h/o SBO, h/o GI bleed, chronic pain, h/o uti's, h/o hyponatremia, tremor, HLD. Cameron Ford resides at the [facility], [place]. Cameron Ford is w/c dependent, requires assistance for ADL's, bathing, dressing. Cameron Ford does feed himself with fair appetite and current weight 203.9 lbs. Staff endorses no recent falls, hospitalizations, infections. Cameron Ford does become intermit agitated with interactions. At present Cameron Ford is sitting in the w/c in his room. Cameron Ford did make eye contact, more cooperative this pc visit. Cameron Ford did ruminate about his pain to right shoulder, having deformed  collar bone, current pain regimen has been effective. We talked about ros, functional ability. Cameron Ford endorses he does get oob daily, daily routine, what bring his joy. Cameron Ford endorses he has a tooth that has been hurting him, the dental hygienist is actually working with his room-mate and will discuss with  [place] Cameron Placeres complaint about his tooth pain today. We talked about appetite, no noted weight loss, had 1 lb gain. Limited discussion with Cameron Ford focused on specific topics, short with his answers. Supportive role, provided. Cameron Ford was cooperative with assessment. Attempted to contact Cameron Ford, no new changes recommended today, will continue to follow weights, chronic pain (no new recommendations today, current effective pain relief with regimen), continue to monitor for cognitive and functional debility and disease progression. No new orders recommended today. Support provided. Updated staff.   Therapeutic listening, emotional support provided. Medical goals, medications and poc reviewed. Will continue to monitor, follow, contact PR for update on pc visit, though no answer. Updated staff.    History obtained from review of EMR, discussion with primary team, and interview with family, facility staff/caregiver and/or Cameron. Mc[place]an.  I reviewed available labs, medications, imaging, studies and related documents from the EMR.  Records reviewed and summarized above.   Physical Exam: Constitutional: NAD General: obese, male, debilitated, intermit irritated EYES: ids intact ENMT: intact hearing, oral mucous membranes moist Skin: warm and dry Neuro:  no generalized weakness,  + cognitive impairment Psych: Alert, oriented to self, place, interactive Thank you for the opportunity to participate in the care of Cameron. Ford. Please call our office at [phone] if we can be of additional assistance.   [place] Andy Ihor Gully, NP

## 2022-06-17 ENCOUNTER — Encounter: Payer: Self-pay | Admitting: Nurse Practitioner

## 2022-06-17 ENCOUNTER — Non-Acute Institutional Stay: Payer: 59 | Admitting: Nurse Practitioner

## 2022-06-17 DIAGNOSIS — R5381 Other malaise: Secondary | ICD-10-CM

## 2022-06-17 DIAGNOSIS — F03918 Unspecified dementia, unspecified severity, with other behavioral disturbance: Secondary | ICD-10-CM

## 2022-06-17 DIAGNOSIS — Z515 Encounter for palliative care: Secondary | ICD-10-CM

## 2022-06-17 NOTE — Progress Notes (Signed)
[facility] Consult Note Telephone: [phone]  Fax: [phone]    Date of encounter: 06/17/22 5:22 PM PATIENT NAME: Cameron Ford [id number]   [phone] (home)  DOB: [birth year] MRN: [id number] PRIMARY CARE PROVIDER:    [place]:    Contact Information     Name Relation Home Work [place], [place] [phone]   [place] Other [place] Other [phone]     [place] Single Sister [phone]     Sim Boast Other   [phone]      I met face to face with patient in facility. Palliative Care was asked to follow this patient by consultation request of [facility] to address advance care planning and complex medical decision making. This is a follow up visit.                                  ASSESSMENT AND PLAN / RECOMMENDATIONS:  Symptom Management/Plan: 1. Advance Care Planning;  Full code; MOST form completed 06/15/2020 in [place]; states full scope of treatment, wishes are for IV, Abx; Feeding tube   2. Dementia with Debility ongoing, w/c dependent, continue to encourage mobility, oob, self independence. Fall precautions. Encourage Cameron Ford to feed himself. Discussed importance with Cameron Ford, continue to monitor, positive reinforcements, supportive role 03/25/2022 weight 203.9 lbs 04/22/2022 weight 204.1 lbs 3. Palliative care encounter; Palliative care encounter; Palliative medicine team will continue to support patient, patient's family, and medical team. Visit consisted of counseling and education dealing with the complex and emotionally intense issues of symptom management and palliative care in the setting of serious and potentially life-threatening illness   Follow up Palliative Care Visit: PC f/u visit further discussion monitor trends of appetite, weights,  monitor for functional, cognitive decline with chronic disease progression, assess any active symptoms, supportive role.Palliative care will continue to follow for complex medical decision making, advance care planning, and clarification of goals. Return 4 to 8 weeks or prn.   I spent 45 minutes providing this consultation starting at 12:15 pm. More than 50% of the time in this consultation was spent in counseling and care coordination. PPS: 50%   Chief Complaint: Follow up palliative consult for complex medical decision making, address goals, manage ongoing symptoms   HISTORY OF PRESENT ILLNESS:  Cameron Ford is a 77 y.o. year old male  with multiple medical problems including Dementia with behaviors, diastolic CHF, h/o PE, COPD, h/o respiratory failure with hypoxia, h/o SBO, h/o GI bleed, chronic pain, h/o uti's, h/o hyponatremia, tremor, HLD. Cameron Ford resides at the [facility], [place]. Cameron Ford is w/c dependent, requires assistance for ADL's, bathing, dressing. Cameron Ford does feed himself. Staff endorses no recent falls, hospitalizations, infections. Purpose of today PC f/u visit further discussion monitor trends of appetite, weights, monitor for functional, cognitive decline with chronic disease progression, assess any active symptoms, supportive role. At present Cameron Ford is sitting in the w/c in his room. Cameron Ford did make eye contact, answer questions, ros, limited and did not feel like talking much. Supportive role, provided. Cameron Ford was cooperative with assessment. Medications, goals of care, poc reviewed. Attempted to contact Cameron Ford, no new changes recommended today, will continue to follow weights, chronic pain (no new recommendations today, current effective pain relief with regimen), continue  to monitor for cognitive and functional debility and disease progression. No new orders recommended today. Support provided. Updated staff.    Therapeutic listening, emotional support  provided. Medical goals, medications and poc reviewed. Will continue to monitor, follow, contact PR for update on pc visit, though no answer. Updated staff.    History obtained from review of EMR, discussion with primary team, and interview with family, facility staff/caregiver and/or Cameron. Ford.  I reviewed available labs, medications, imaging, studies and related documents from the EMR.  Records reviewed and summarized above.    Physical Exam: Constitutional: NAD General: obese, male, w/c dependent  ENMT: intact hearing, oral mucous membranes moist Neuro:  no generalized weakness,  + cognitive impairment Psych: Alert, oriented to self, place, interactive Thank you for the opportunity to participate in the care of Cameron. Ford. Please call our office at [phone] if we can be of additional assistance.   Cameron Ford Ihor Gully, NP

## 2022-08-22 ENCOUNTER — Encounter: Payer: Self-pay | Admitting: Nurse Practitioner

## 2022-08-22 ENCOUNTER — Non-Acute Institutional Stay: Payer: 59 | Admitting: Nurse Practitioner

## 2022-08-22 DIAGNOSIS — F03918 Unspecified dementia, unspecified severity, with other behavioral disturbance: Secondary | ICD-10-CM

## 2022-08-22 DIAGNOSIS — Z515 Encounter for palliative care: Secondary | ICD-10-CM

## 2022-08-22 DIAGNOSIS — R5381 Other malaise: Secondary | ICD-10-CM

## 2022-08-22 NOTE — Progress Notes (Signed)
Therapist, nutritional Palliative Care Consult Note Telephone: [phone]  Fax: [phone]    Date of encounter: 08/22/22 9:26 PM PATIENT NAME: Cameron Ford [address] [place] [address]   [phone] (home)  DOB: 15-Apr-1946 MRN: [id number] PRIMARY CARE PROVIDER:    [facility] [place]  RESPONSIBLE PARTY:    Contact Information     Name Relation Home Work [place], [place] [phone]   Spruill,Hazel Other [phone]     Cari Caraway Other [phone]     Kathlyn Sacramento Sister [phone]     Meda Coffee Other   [phone]       I met face to face with patient in facility. Palliative Care was asked to follow this patient by consultation request of [facility] to address advance care planning and complex medical decision making. This is a follow up visit.                                  ASSESSMENT AND PLAN / RECOMMENDATIONS:  Symptom Management/Plan: 1. Advance Care Planning;  Full code; MOST form completed 06/15/2020 in [place]; states full scope of treatment, wishes are for IV, Abx; Feeding tube   2. Dementia with Debility ongoing, w/c dependent, continue to encourage mobility, oob, self independence. Fall precautions. Encourage Cameron Bowens to feed himself. Discussed importance with Cameron Rock, continue to monitor, positive reinforcements, supportive role 03/25/2022 weight 203.9 lbs 04/22/2022 weight 204.1 lbs  3. Palliative care encounter; Palliative care encounter; Palliative medicine team will continue to support patient, patient's family, and medical team. Visit consisted of counseling and education dealing with the complex and emotionally intense issues of symptom management and palliative care in the setting of serious and potentially life-threatening illness   Follow up Palliative Care Visit: PC f/u visit further discussion monitor trends of  appetite, weights, monitor for functional, cognitive decline with chronic disease progression, assess any active symptoms, supportive role.Palliative care will continue to follow for complex medical decision making, advance care planning, and clarification of goals. Return 4 to 8 weeks or prn.   I spent 47 minutes providing this consultation. More than 50% of the time in this consultation was spent in counseling and care coordination. PPS: 50%   Chief Complaint: Follow up palliative consult for complex medical decision making, address goals, manage ongoing symptoms   HISTORY OF PRESENT ILLNESS:  Cameron Ford is a 77 y.o. year old male  with multiple medical problems including Dementia with behaviors, diastolic CHF, h/o PE, COPD, h/o respiratory failure with hypoxia, h/o SBO, h/o GI bleed, chronic pain, h/o uti's, h/o hyponatremia, tremor, HLD. Cameron Ford resides at the [facility], [place]. Cameron Ford is w/c dependent, requires assistance for ADL's, bathing, dressing. Cameron Ford does feed himself. Staff endorses no recent falls, hospitalizations, infections. Purpose of today PC f/u visit further discussion monitor trends of appetite, weights, monitor for functional, cognitive decline with chronic disease progression, assess any active symptoms, supportive role. At present Cameron Ford is sitting in the smoking patio, smoking. Declined smoking cessation  Supportive role, provided. Cameron Ford was cooperative with assessment. Medications, goals of care, poc reviewed. Attempted to contact [name], no new changes recommended today, will continue to follow weights, chronic pain (no new recommendations today, current effective pain relief with regimen), continue to monitor for cognitive and functional debility and disease progression. No new orders recommended today.  Support provided. Updated staff.    Therapeutic listening, emotional support provided. Medical goals, medications and poc reviewed. Will continue to  monitor, follow, contact PR for update on pc visit, though no answer. Updated staff.    History obtained from review of EMR, discussion with primary team, and interview with family, facility staff/caregiver and/or Cameron. Ford.  I reviewed available labs, medications, imaging, studies and related documents from the EMR.  Records reviewed and summarized above.    Physical Exam: General: obese, male, w/c dependent  ENMT: intact hearing, oral mucous membranes moist Pulm: Breath sounds clear Cardio: RRR, mild BLE edema Neuro:  no generalized weakness,  + cognitive impairment Psych: Alert, oriented to self, place, interactive Thank you for the opportunity to participate in the care of Cameron. Ford. Please call our office at [phone] if we can be of additional assistance.   Connelly Spruell Z Beni Turrell, NPC

## 2022-09-21 ENCOUNTER — Non-Acute Institutional Stay: Payer: 59 | Admitting: Nurse Practitioner

## 2022-09-21 ENCOUNTER — Encounter: Payer: Self-pay | Admitting: Nurse Practitioner

## 2022-09-21 DIAGNOSIS — R5381 Other malaise: Secondary | ICD-10-CM

## 2022-09-21 DIAGNOSIS — Z515 Encounter for palliative care: Secondary | ICD-10-CM

## 2022-09-21 DIAGNOSIS — F03918 Unspecified dementia, unspecified severity, with other behavioral disturbance: Secondary | ICD-10-CM

## 2022-09-21 NOTE — Progress Notes (Addendum)
Therapist, nutritional Palliative Care Consult Note Telephone: [phone]  Fax: [phone]    Date of encounter: 09/21/22 10:27 PM PATIENT NAME: Cameron Ford [address] [place] [address]   [phone] (home)  DOB: [birth year] MRN: [id number] PRIMARY CARE PROVIDER:    [facility]  RESPONSIBLE PARTY:    Contact Information     Name Relation Home Work [place], [place] [phone]   Spruill,Hazel Other [phone]     Cari Caraway Other [phone]     Kathlyn Sacramento Sister [phone]     Meda Coffee Other   [phone]     I met face to face with patient in facility. Palliative Care was asked to follow this patient by consultation request of [facility] to address advance care planning and complex medical decision making. This is a follow up visit.                                  ASSESSMENT AND PLAN / RECOMMENDATIONS:  Symptom Management/Plan: 1. Advance Care Planning;  Full code; MOST form completed 06/15/2020 in [place]; states full scope of treatment, wishes are for IV, Abx; Feeding tube   2. Dementia with Debility ongoing, w/c dependent, continue to encourage mobility, oob, self independence. Continues weight gain. Fall precautions. Encourage Cameron Mica to feed himself. Discussed importance with Cameron Krizman, continue to monitor, positive reinforcements, supportive role 03/25/2022 weight 203.9 lbs 04/22/2022 weight 204.1 lbs 4/22-24 weight 213.6 lbs   3. Palliative care encounter; Palliative care encounter; Palliative medicine team will continue to support patient, patient's family, and medical team. Visit consisted of counseling and education dealing with the complex and emotionally intense issues of symptom management and palliative care in the setting of serious and potentially life-threatening illness   Follow up Palliative Care Visit: PC f/u visit further  discussion monitor trends of appetite, weights, monitor for functional, cognitive decline with chronic disease progression, assess any active symptoms, supportive role.Palliative care will continue to follow for complex medical decision making, advance care planning, and clarification of goals. Return 2 to 8 weeks or prn.   I spent 45 minutes providing this consultation. More than 50% of the time in this consultation was spent in counseling and care coordination. PPS: 50%   Chief Complaint: Follow up palliative consult for complex medical decision making, address goals, manage ongoing symptoms   HISTORY OF PRESENT ILLNESS:  Cameron Ford is a 77 y.o. year old male  with multiple medical problems including Dementia with behaviors, diastolic CHF, h/o PE, COPD, h/o respiratory failure with hypoxia, h/o SBO, h/o GI bleed, chronic pain, h/o uti's, h/o hyponatremia, tremor, HLD. Cameron Ford resides at the [facility], [place]. Cameron Ford is w/c dependent, requires assistance for ADL's, bathing, dressing. Cameron Ford does feed himself. Staff endorses no recent falls, hospitalizations, infections. Purpose of today PC f/u visit further discussion monitor trends of appetite, weights, monitor for functional, cognitive decline with chronic disease progression, assess any active symptoms, supportive role. At present Cameron Ford is sitting in the smoking patio, smoking. Declined smoking cessation. We talked about oob, praised Cameron Trom for being oob, talked about daily routine consistent with spending most of his time on the smoking patio. We talked about other activities at the facility, other activities he likes to do which is mostly smoking. Talked about foods he likes, he did talk about this  topic. Cameron Ford was cooperative with assessment, though is interrupting his smoking. Support provided. Cameron Ford is stable. Medications, goals of care, poc reviewed. Attempted to contact Cameron Ford, no new changes recommended today,  will continue to follow weights, chronic pain (no new recommendations today, current effective pain relief with regimen), continue to monitor for cognitive and functional debility and disease progression. No new orders recommended today. Support provided. Updated staff.    Therapeutic listening, emotional support provided. Medical goals, medications and poc reviewed. Will continue to monitor, follow, contact PR for update on pc visit, though no answer. Updated staff.    History obtained from review of EMR, discussion with primary team, and interview with family, facility staff/caregiver and/or Cameron. Edwardson.  I reviewed available labs, medications, imaging, studies and related documents from the EMR.  Records reviewed and summarized above.    Physical Exam: General: obese, male, w/c dependent  Pulm: Breath sounds clear Cardio: RRR, mild BLE edema Neuro:  no generalized weakness,  + cognitive impairment Psych: Alert, oriented to self, place, interactive Thank you for the opportunity to participate in the care of Cameron. Ford. Please call our office at [phone] if we can be of additional assistance.   Ashdon Gillson Prince [place], NP
